# Patient Record
Sex: Female | Born: 1960 | Race: White | Hispanic: No | Marital: Single | Smoking: Former smoker
Health system: Southern US, Community
[De-identification: ages and names within clinical notes are randomized; demographics above are authoritative.]

## PROBLEM LIST (undated history)

## (undated) DIAGNOSIS — R51 Headache: Secondary | ICD-10-CM

## (undated) DIAGNOSIS — K449 Diaphragmatic hernia without obstruction or gangrene: Secondary | ICD-10-CM

## (undated) DIAGNOSIS — R519 Headache, unspecified: Secondary | ICD-10-CM

## (undated) DIAGNOSIS — F419 Anxiety disorder, unspecified: Secondary | ICD-10-CM

## (undated) DIAGNOSIS — G8929 Other chronic pain: Secondary | ICD-10-CM

## (undated) DIAGNOSIS — F4310 Post-traumatic stress disorder, unspecified: Secondary | ICD-10-CM

## (undated) DIAGNOSIS — R5382 Chronic fatigue, unspecified: Secondary | ICD-10-CM

## (undated) DIAGNOSIS — F4001 Agoraphobia with panic disorder: Secondary | ICD-10-CM

## (undated) DIAGNOSIS — M797 Fibromyalgia: Secondary | ICD-10-CM

## (undated) DIAGNOSIS — I890 Lymphedema, not elsewhere classified: Secondary | ICD-10-CM

## (undated) DIAGNOSIS — F319 Bipolar disorder, unspecified: Secondary | ICD-10-CM

## (undated) DIAGNOSIS — Z8659 Personal history of other mental and behavioral disorders: Secondary | ICD-10-CM

## (undated) DIAGNOSIS — M549 Dorsalgia, unspecified: Secondary | ICD-10-CM

## (undated) DIAGNOSIS — N3010 Interstitial cystitis (chronic) without hematuria: Secondary | ICD-10-CM

## (undated) DIAGNOSIS — M1990 Unspecified osteoarthritis, unspecified site: Secondary | ICD-10-CM

## (undated) HISTORY — PX: BARIATRIC SURGERY: SHX1103

## (undated) HISTORY — PX: APPENDECTOMY: SHX54

## (undated) HISTORY — PX: TONSILLECTOMY: SUR1361

## (undated) HISTORY — PX: CHOLECYSTECTOMY: SHX55

## (undated) HISTORY — PX: FOOT SURGERY: SHX648

## (undated) HISTORY — PX: ABDOMINAL HYSTERECTOMY: SHX81

## (undated) HISTORY — DX: Anxiety disorder, unspecified: F41.9

## (undated) HISTORY — PX: HEMORRHOID SURGERY: SHX153

## (undated) HISTORY — DX: Personal history of other mental and behavioral disorders: Z86.59

## (undated) HISTORY — PX: HERNIA REPAIR: SHX51

## (undated) HISTORY — PX: ESOPHAGUS SURGERY: SHX626

---

## 2006-01-25 ENCOUNTER — Emergency Department (HOSPITAL_COMMUNITY): Admission: EM | Admit: 2006-01-25 | Discharge: 2006-01-25 | Payer: Self-pay | Admitting: Emergency Medicine

## 2006-03-04 ENCOUNTER — Emergency Department (HOSPITAL_COMMUNITY): Admission: EM | Admit: 2006-03-04 | Discharge: 2006-03-05 | Payer: Self-pay | Admitting: Emergency Medicine

## 2006-03-31 ENCOUNTER — Emergency Department (HOSPITAL_COMMUNITY): Admission: EM | Admit: 2006-03-31 | Discharge: 2006-04-01 | Payer: Self-pay | Admitting: Emergency Medicine

## 2007-03-13 ENCOUNTER — Emergency Department (HOSPITAL_COMMUNITY): Admission: EM | Admit: 2007-03-13 | Discharge: 2007-03-13 | Payer: Self-pay | Admitting: Emergency Medicine

## 2007-06-14 ENCOUNTER — Emergency Department (HOSPITAL_COMMUNITY): Admission: EM | Admit: 2007-06-14 | Discharge: 2007-06-14 | Payer: Self-pay | Admitting: Emergency Medicine

## 2007-06-26 ENCOUNTER — Ambulatory Visit (HOSPITAL_COMMUNITY): Admission: RE | Admit: 2007-06-26 | Discharge: 2007-06-26 | Payer: Self-pay | Admitting: Orthopaedic Surgery

## 2007-07-20 ENCOUNTER — Emergency Department (HOSPITAL_COMMUNITY): Admission: EM | Admit: 2007-07-20 | Discharge: 2007-07-20 | Payer: Self-pay | Admitting: Emergency Medicine

## 2007-07-31 ENCOUNTER — Encounter (HOSPITAL_COMMUNITY): Admission: RE | Admit: 2007-07-31 | Discharge: 2007-08-30 | Payer: Self-pay | Admitting: Orthopaedic Surgery

## 2007-10-10 ENCOUNTER — Emergency Department (HOSPITAL_COMMUNITY): Admission: EM | Admit: 2007-10-10 | Discharge: 2007-10-10 | Payer: Self-pay | Admitting: Emergency Medicine

## 2007-11-29 ENCOUNTER — Ambulatory Visit: Payer: Self-pay | Admitting: Internal Medicine

## 2008-05-07 ENCOUNTER — Emergency Department (HOSPITAL_COMMUNITY): Admission: EM | Admit: 2008-05-07 | Discharge: 2008-05-07 | Payer: Self-pay | Admitting: Emergency Medicine

## 2008-08-27 ENCOUNTER — Ambulatory Visit: Payer: Self-pay | Admitting: Urology

## 2008-09-03 ENCOUNTER — Ambulatory Visit: Payer: Self-pay | Admitting: Urology

## 2008-09-28 ENCOUNTER — Emergency Department (HOSPITAL_COMMUNITY): Admission: EM | Admit: 2008-09-28 | Discharge: 2008-09-28 | Payer: Self-pay | Admitting: Emergency Medicine

## 2008-10-18 ENCOUNTER — Emergency Department (HOSPITAL_COMMUNITY): Admission: EM | Admit: 2008-10-18 | Discharge: 2008-10-18 | Payer: Self-pay | Admitting: Emergency Medicine

## 2008-12-02 ENCOUNTER — Emergency Department (HOSPITAL_COMMUNITY): Admission: EM | Admit: 2008-12-02 | Discharge: 2008-12-02 | Payer: Self-pay | Admitting: Emergency Medicine

## 2008-12-23 ENCOUNTER — Emergency Department: Payer: Self-pay | Admitting: Emergency Medicine

## 2009-01-22 ENCOUNTER — Ambulatory Visit: Payer: Self-pay | Admitting: Psychiatry

## 2009-01-22 ENCOUNTER — Other Ambulatory Visit: Payer: Self-pay

## 2009-01-22 ENCOUNTER — Other Ambulatory Visit: Payer: Self-pay | Admitting: Emergency Medicine

## 2009-01-22 ENCOUNTER — Inpatient Hospital Stay (HOSPITAL_COMMUNITY): Admission: RE | Admit: 2009-01-22 | Discharge: 2009-01-23 | Payer: Self-pay | Admitting: Psychiatry

## 2009-02-26 ENCOUNTER — Emergency Department: Payer: Self-pay | Admitting: Emergency Medicine

## 2009-06-07 ENCOUNTER — Emergency Department (HOSPITAL_COMMUNITY): Admission: EM | Admit: 2009-06-07 | Discharge: 2009-06-07 | Payer: Self-pay | Admitting: Emergency Medicine

## 2009-08-01 ENCOUNTER — Emergency Department (HOSPITAL_COMMUNITY): Admission: EM | Admit: 2009-08-01 | Discharge: 2009-08-01 | Payer: Self-pay | Admitting: Emergency Medicine

## 2009-12-09 ENCOUNTER — Emergency Department (HOSPITAL_COMMUNITY): Admission: EM | Admit: 2009-12-09 | Discharge: 2009-12-09 | Payer: Self-pay | Admitting: Emergency Medicine

## 2009-12-21 ENCOUNTER — Emergency Department: Payer: Self-pay | Admitting: Emergency Medicine

## 2009-12-25 ENCOUNTER — Ambulatory Visit: Payer: Self-pay | Admitting: Urology

## 2010-01-03 ENCOUNTER — Emergency Department: Payer: Self-pay | Admitting: Emergency Medicine

## 2010-01-16 ENCOUNTER — Emergency Department (HOSPITAL_COMMUNITY)
Admission: EM | Admit: 2010-01-16 | Discharge: 2010-01-17 | Payer: Self-pay | Source: Home / Self Care | Admitting: Emergency Medicine

## 2010-01-23 ENCOUNTER — Emergency Department (HOSPITAL_COMMUNITY)
Admission: EM | Admit: 2010-01-23 | Discharge: 2010-01-24 | Payer: Self-pay | Source: Home / Self Care | Admitting: Emergency Medicine

## 2010-01-24 ENCOUNTER — Emergency Department: Payer: Self-pay | Admitting: Emergency Medicine

## 2010-03-11 ENCOUNTER — Emergency Department (HOSPITAL_COMMUNITY)
Admission: EM | Admit: 2010-03-11 | Discharge: 2010-03-11 | Disposition: A | Discharge status: Home / Self Care | Payer: Medicare Other | Attending: Emergency Medicine | Admitting: Emergency Medicine

## 2010-03-11 ENCOUNTER — Ambulatory Visit: Payer: Self-pay | Admitting: Pain Medicine

## 2010-03-11 DIAGNOSIS — M549 Dorsalgia, unspecified: Secondary | ICD-10-CM | POA: Insufficient documentation

## 2010-03-11 DIAGNOSIS — IMO0001 Reserved for inherently not codable concepts without codable children: Secondary | ICD-10-CM | POA: Insufficient documentation

## 2010-03-16 ENCOUNTER — Emergency Department (HOSPITAL_COMMUNITY)
Admission: EM | Admit: 2010-03-16 | Discharge: 2010-03-17 | Disposition: A | Discharge status: Home / Self Care | Payer: Medicare Other | Attending: Emergency Medicine | Admitting: Emergency Medicine

## 2010-03-16 DIAGNOSIS — G43909 Migraine, unspecified, not intractable, without status migrainosus: Secondary | ICD-10-CM | POA: Insufficient documentation

## 2010-03-19 ENCOUNTER — Emergency Department (HOSPITAL_COMMUNITY)
Admission: EM | Admit: 2010-03-19 | Discharge: 2010-03-20 | Disposition: A | Discharge status: Home / Self Care | Payer: Medicare Other | Attending: Emergency Medicine | Admitting: Emergency Medicine

## 2010-03-19 ENCOUNTER — Emergency Department (HOSPITAL_COMMUNITY): Payer: Medicare Other

## 2010-03-19 DIAGNOSIS — R112 Nausea with vomiting, unspecified: Secondary | ICD-10-CM | POA: Insufficient documentation

## 2010-03-19 DIAGNOSIS — H538 Other visual disturbances: Secondary | ICD-10-CM | POA: Insufficient documentation

## 2010-03-19 DIAGNOSIS — R51 Headache: Secondary | ICD-10-CM | POA: Insufficient documentation

## 2010-04-06 LAB — URINALYSIS, ROUTINE W REFLEX MICROSCOPIC
Bilirubin Urine: NEGATIVE
Glucose, UA: NEGATIVE mg/dL
Hgb urine dipstick: NEGATIVE
Ketones, ur: NEGATIVE mg/dL
Leukocytes, UA: NEGATIVE
Nitrite: POSITIVE — AB
Protein, ur: NEGATIVE mg/dL
Specific Gravity, Urine: <1.005 — ABNORMAL LOW (ref 1.005–1.030)
Urobilinogen, UA: 0.2 mg/dL (ref 0.0–1.0)
pH: 6.5 (ref 5.0–8.0)

## 2010-04-06 LAB — COMPREHENSIVE METABOLIC PANEL
ALT: 118 U/L — ABNORMAL HIGH (ref 0–35)
Alkaline Phosphatase: 377 U/L — ABNORMAL HIGH (ref 39–117)
Calcium: 8.6 mg/dL (ref 8.4–10.5)
GFR calc Af Amer: >60 mL/min (ref >60)
Potassium: 3.8 mEq/L (ref 3.5–5.1)
Sodium: 138 mEq/L (ref 135–145)
Total Bilirubin: 0.6 mg/dL (ref 0.3–1.2)

## 2010-04-06 LAB — DIFFERENTIAL
Basophils Absolute: 0 K/uL (ref 0.0–0.1)
Basophils Relative: 0 % (ref 0–1)
Eosinophils Absolute: 0.1 10*3/uL (ref 0.0–0.7)
Eosinophils Relative: 1 % (ref 0–5)
Lymphocytes Relative: 29 % (ref 12–46)
Lymphs Abs: 2 10*3/uL (ref 0.7–4.0)
Monocytes Absolute: 0.4 10*3/uL (ref 0.1–1.0)
Monocytes Relative: 6 % (ref 3–12)
Neutro Abs: 4.3 10*3/uL (ref 1.7–7.7)
Neutrophils Relative %: 63 % (ref 43–77)

## 2010-04-06 LAB — PROTIME-INR
INR: 1.03 (ref 0.00–1.49)
Prothrombin Time: 13.7 seconds (ref 11.6–15.2)

## 2010-04-06 LAB — CBC
HCT: 35 % — ABNORMAL LOW (ref 36.0–46.0)
Hemoglobin: 11.9 g/dL — ABNORMAL LOW (ref 12.0–15.0)
MCH: 33.1 pg (ref 26.0–34.0)
MCHC: 34 g/dL (ref 30.0–36.0)
MCV: 97.5 fL (ref 78.0–100.0)
Platelets: 221 10*3/uL (ref 150–400)
RBC: 3.59 MIL/uL — ABNORMAL LOW (ref 3.87–5.11)
RDW: 12.5 % (ref 11.5–15.5)
WBC: 6.9 10*3/uL (ref 4.0–10.5)

## 2010-04-06 LAB — COMPREHENSIVE METABOLIC PANEL WITH GFR
AST: 389 U/L — ABNORMAL HIGH (ref 0–37)
Albumin: 3.6 g/dL (ref 3.5–5.2)
BUN: 5 mg/dL — ABNORMAL LOW (ref 6–23)
CO2: 28 meq/L (ref 19–32)
Chloride: 102 meq/L (ref 96–112)
Creatinine, Ser: 0.64 mg/dL (ref 0.4–1.2)
GFR calc non Af Amer: >60 mL/min (ref >60)
Glucose, Bld: 92 mg/dL (ref 70–99)
Total Protein: 5.8 g/dL — ABNORMAL LOW (ref 6.0–8.3)

## 2010-04-06 LAB — ETHANOL: Alcohol, Ethyl (B): <5 mg/dL (ref 0–10)

## 2010-04-06 LAB — LIPASE, BLOOD: Lipase: 194 U/L — ABNORMAL HIGH (ref 11–59)

## 2010-04-06 LAB — URINE MICROSCOPIC-ADD ON

## 2010-04-06 LAB — APTT: aPTT: 28 seconds (ref 24–37)

## 2010-04-07 LAB — URINALYSIS, ROUTINE W REFLEX MICROSCOPIC
Hgb urine dipstick: NEGATIVE
Ketones, ur: NEGATIVE mg/dL
Nitrite: NEGATIVE

## 2010-04-12 LAB — WET PREP, GENITAL
Clue Cells Wet Prep HPF POC: NONE SEEN
Yeast Wet Prep HPF POC: NONE SEEN

## 2010-04-12 LAB — URINALYSIS, ROUTINE W REFLEX MICROSCOPIC
Hgb urine dipstick: NEGATIVE
Nitrite: NEGATIVE
Specific Gravity, Urine: >1.03 — ABNORMAL HIGH (ref 1.005–1.030)
Urobilinogen, UA: 1 mg/dL (ref 0.0–1.0)
pH: 5.5 (ref 5.0–8.0)

## 2010-04-12 LAB — GC/CHLAMYDIA PROBE AMP, GENITAL
Chlamydia, DNA Probe: NEGATIVE
GC Probe Amp, Genital: NEGATIVE

## 2010-04-15 ENCOUNTER — Ambulatory Visit: Payer: Self-pay | Admitting: Gastroenterology

## 2010-04-27 LAB — BASIC METABOLIC PANEL
BUN: 7 mg/dL (ref 6–23)
CO2: 28 mEq/L (ref 19–32)
Glucose, Bld: 215 mg/dL — ABNORMAL HIGH (ref 70–99)
Potassium: 3 mEq/L — ABNORMAL LOW (ref 3.5–5.1)
Sodium: 138 mEq/L (ref 135–145)

## 2010-04-27 LAB — CBC
HCT: 40 % (ref 36.0–46.0)
Hemoglobin: 13.4 g/dL (ref 12.0–15.0)
MCHC: 33.5 g/dL (ref 30.0–36.0)
RDW: 13.1 % (ref 11.5–15.5)

## 2010-04-27 LAB — DIFFERENTIAL
Basophils Absolute: 0 10*3/uL (ref 0.0–0.1)
Basophils Relative: 1 % (ref 0–1)
Eosinophils Absolute: 0.4 10*3/uL (ref 0.0–0.7)
Eosinophils Relative: 5 % (ref 0–5)
Monocytes Absolute: 0.5 10*3/uL (ref 0.1–1.0)

## 2010-04-27 LAB — GLUCOSE, CAPILLARY
Glucose-Capillary: 126 mg/dL — ABNORMAL HIGH (ref 70–99)
Glucose-Capillary: 70 mg/dL (ref 70–99)

## 2010-05-01 LAB — CBC
Hemoglobin: 13.4 g/dL (ref 12.0–15.0)
RBC: 4 MIL/uL (ref 3.87–5.11)
RDW: 12.6 % (ref 11.5–15.5)
WBC: 4.7 10*3/uL (ref 4.0–10.5)

## 2010-05-01 LAB — COMPREHENSIVE METABOLIC PANEL
ALT: 15 U/L (ref 0–35)
Alkaline Phosphatase: 114 U/L (ref 39–117)
Chloride: 104 mEq/L (ref 96–112)
Glucose, Bld: 105 mg/dL — ABNORMAL HIGH (ref 70–99)
Potassium: 4 mEq/L (ref 3.5–5.1)
Sodium: 141 mEq/L (ref 135–145)
Total Protein: 6 g/dL (ref 6.0–8.3)

## 2010-05-01 LAB — DIFFERENTIAL
Basophils Relative: 1 % (ref 0–1)
Eosinophils Absolute: 0.2 10*3/uL (ref 0.0–0.7)
Monocytes Absolute: 0.3 10*3/uL (ref 0.1–1.0)
Monocytes Relative: 7 % (ref 3–12)
Neutrophils Relative %: 42 % — ABNORMAL LOW (ref 43–77)

## 2010-05-06 LAB — URINALYSIS, ROUTINE W REFLEX MICROSCOPIC
Bilirubin Urine: NEGATIVE
Hgb urine dipstick: NEGATIVE
Protein, ur: NEGATIVE mg/dL
Urobilinogen, UA: 0.2 mg/dL (ref 0.0–1.0)

## 2010-05-06 LAB — DIFFERENTIAL
Basophils Absolute: 0 10*3/uL (ref 0.0–0.1)
Lymphocytes Relative: 56 % — ABNORMAL HIGH (ref 12–46)
Monocytes Absolute: 0.3 10*3/uL (ref 0.1–1.0)
Monocytes Relative: 6 % (ref 3–12)
Neutro Abs: 1.5 10*3/uL — ABNORMAL LOW (ref 1.7–7.7)

## 2010-05-06 LAB — BASIC METABOLIC PANEL
Calcium: 9.2 mg/dL (ref 8.4–10.5)
GFR calc Af Amer: >60 mL/min (ref >60)
GFR calc non Af Amer: >60 mL/min (ref >60)
Sodium: 141 mEq/L (ref 135–145)

## 2010-05-06 LAB — ETHANOL: Alcohol, Ethyl (B): <5 mg/dL (ref 0–10)

## 2010-05-06 LAB — RAPID URINE DRUG SCREEN, HOSP PERFORMED
Amphetamines: NOT DETECTED
Tetrahydrocannabinol: NOT DETECTED

## 2010-05-06 LAB — CBC
Hemoglobin: 12.9 g/dL (ref 12.0–15.0)
RBC: 3.88 MIL/uL (ref 3.87–5.11)

## 2010-10-16 LAB — DIFFERENTIAL
Basophils Absolute: 0
Basophils Relative: 0
Eosinophils Absolute: 0.5
Eosinophils Relative: 6 — ABNORMAL HIGH
Neutrophils Relative %: 40 — ABNORMAL LOW

## 2010-10-16 LAB — COMPREHENSIVE METABOLIC PANEL
ALT: 37 — ABNORMAL HIGH
AST: 38 — ABNORMAL HIGH
Alkaline Phosphatase: 134 — ABNORMAL HIGH
CO2: 28
Calcium: 9.1
Chloride: 105
GFR calc non Af Amer: >60
Glucose, Bld: 100 — ABNORMAL HIGH
Potassium: 3.6
Sodium: 140
Total Bilirubin: 0.6

## 2010-10-16 LAB — URINALYSIS, ROUTINE W REFLEX MICROSCOPIC
Bilirubin Urine: NEGATIVE
Nitrite: NEGATIVE
Specific Gravity, Urine: <1.005 — ABNORMAL LOW
pH: 7

## 2010-10-16 LAB — CBC
Hemoglobin: 14.6
MCHC: 34.5
RBC: 4.25
WBC: 8.3

## 2010-10-26 LAB — URINALYSIS, ROUTINE W REFLEX MICROSCOPIC
Ketones, ur: NEGATIVE
Protein, ur: NEGATIVE
Urobilinogen, UA: 0.2

## 2010-10-26 LAB — RAPID URINE DRUG SCREEN, HOSP PERFORMED
Barbiturates: NOT DETECTED
Benzodiazepines: POSITIVE — AB

## 2011-02-15 ENCOUNTER — Encounter (HOSPITAL_COMMUNITY): Payer: Self-pay

## 2011-02-15 ENCOUNTER — Emergency Department (HOSPITAL_COMMUNITY): Payer: Medicare Other

## 2011-02-15 ENCOUNTER — Emergency Department (HOSPITAL_COMMUNITY)
Admission: EM | Admit: 2011-02-15 | Discharge: 2011-02-15 | Disposition: A | Discharge status: Home / Self Care | Payer: Medicare Other | Attending: Emergency Medicine | Admitting: Emergency Medicine

## 2011-02-15 DIAGNOSIS — F4001 Agoraphobia with panic disorder: Secondary | ICD-10-CM | POA: Insufficient documentation

## 2011-02-15 DIAGNOSIS — Z9079 Acquired absence of other genital organ(s): Secondary | ICD-10-CM | POA: Insufficient documentation

## 2011-02-15 DIAGNOSIS — R7989 Other specified abnormal findings of blood chemistry: Secondary | ICD-10-CM | POA: Insufficient documentation

## 2011-02-15 DIAGNOSIS — R109 Unspecified abdominal pain: Secondary | ICD-10-CM | POA: Insufficient documentation

## 2011-02-15 DIAGNOSIS — F319 Bipolar disorder, unspecified: Secondary | ICD-10-CM | POA: Insufficient documentation

## 2011-02-15 DIAGNOSIS — M549 Dorsalgia, unspecified: Secondary | ICD-10-CM | POA: Insufficient documentation

## 2011-02-15 DIAGNOSIS — F4310 Post-traumatic stress disorder, unspecified: Secondary | ICD-10-CM | POA: Insufficient documentation

## 2011-02-15 DIAGNOSIS — K449 Diaphragmatic hernia without obstruction or gangrene: Secondary | ICD-10-CM | POA: Insufficient documentation

## 2011-02-15 DIAGNOSIS — F17200 Nicotine dependence, unspecified, uncomplicated: Secondary | ICD-10-CM | POA: Insufficient documentation

## 2011-02-15 DIAGNOSIS — G8929 Other chronic pain: Secondary | ICD-10-CM | POA: Insufficient documentation

## 2011-02-15 DIAGNOSIS — IMO0001 Reserved for inherently not codable concepts without codable children: Secondary | ICD-10-CM | POA: Insufficient documentation

## 2011-02-15 DIAGNOSIS — N3010 Interstitial cystitis (chronic) without hematuria: Secondary | ICD-10-CM | POA: Insufficient documentation

## 2011-02-15 DIAGNOSIS — Z9889 Other specified postprocedural states: Secondary | ICD-10-CM | POA: Insufficient documentation

## 2011-02-15 DIAGNOSIS — R4182 Altered mental status, unspecified: Secondary | ICD-10-CM | POA: Insufficient documentation

## 2011-02-15 DIAGNOSIS — R51 Headache: Secondary | ICD-10-CM | POA: Insufficient documentation

## 2011-02-15 HISTORY — DX: Lymphedema, not elsewhere classified: I89.0

## 2011-02-15 HISTORY — DX: Post-traumatic stress disorder, unspecified: F43.10

## 2011-02-15 HISTORY — DX: Diaphragmatic hernia without obstruction or gangrene: K44.9

## 2011-02-15 HISTORY — DX: Headache, unspecified: R51.9

## 2011-02-15 HISTORY — DX: Dorsalgia, unspecified: M54.9

## 2011-02-15 HISTORY — DX: Agoraphobia with panic disorder: F40.01

## 2011-02-15 HISTORY — DX: Other chronic pain: G89.29

## 2011-02-15 HISTORY — DX: Bipolar disorder, unspecified: F31.9

## 2011-02-15 HISTORY — DX: Fibromyalgia: M79.7

## 2011-02-15 HISTORY — DX: Chronic fatigue, unspecified: R53.82

## 2011-02-15 HISTORY — DX: Headache: R51

## 2011-02-15 HISTORY — DX: Unspecified osteoarthritis, unspecified site: M19.90

## 2011-02-15 HISTORY — DX: Interstitial cystitis (chronic) without hematuria: N30.10

## 2011-02-15 LAB — URINALYSIS, ROUTINE W REFLEX MICROSCOPIC
Bilirubin Urine: NEGATIVE
Glucose, UA: NEGATIVE mg/dL
Ketones, ur: NEGATIVE mg/dL
Leukocytes, UA: NEGATIVE
Protein, ur: NEGATIVE mg/dL

## 2011-02-15 LAB — COMPREHENSIVE METABOLIC PANEL
ALT: 88 U/L — ABNORMAL HIGH (ref 0–35)
AST: 50 U/L — ABNORMAL HIGH (ref 0–37)
Alkaline Phosphatase: 305 U/L — ABNORMAL HIGH (ref 39–117)
CO2: 35 mEq/L — ABNORMAL HIGH (ref 19–32)
GFR calc Af Amer: >90 mL/min (ref >90)
GFR calc non Af Amer: >90 mL/min (ref >90)
Glucose, Bld: 105 mg/dL — ABNORMAL HIGH (ref 70–99)
Potassium: 4.1 mEq/L (ref 3.5–5.1)
Sodium: 139 mEq/L (ref 135–145)

## 2011-02-15 LAB — URINE CULTURE
Colony Count: NO GROWTH
Culture: NO GROWTH

## 2011-02-15 LAB — DIFFERENTIAL
Lymphocytes Relative: 33 % (ref 12–46)
Lymphs Abs: 2.2 10*3/uL (ref 0.7–4.0)
Neutro Abs: 3.9 10*3/uL (ref 1.7–7.7)
Neutrophils Relative %: 58 % (ref 43–77)

## 2011-02-15 LAB — CBC
Platelets: 242 10*3/uL (ref 150–400)
RBC: 3.79 MIL/uL — ABNORMAL LOW (ref 3.87–5.11)
WBC: 6.7 10*3/uL (ref 4.0–10.5)

## 2011-02-15 LAB — GLUCOSE, CAPILLARY: Glucose-Capillary: 92 mg/dL (ref 70–99)

## 2011-02-15 MED ORDER — METHOCARBAMOL 500 MG PO TABS: 1000.0000 mg | ORAL_TABLET | Freq: Four times a day (QID) | ORAL | Status: AC | PRN

## 2011-02-15 NOTE — ED Notes (Signed)
C/o right flank pain (cramping, pressure) x 2 weeks that radiates to right abdomen; c/o nausea; c/o headache.  A&ox4; answers questions appropriately.

## 2011-02-15 NOTE — ED Notes (Signed)
Pt reports r lower back pain x 2 weeks.  Denies injury.  Reports has interstitial cystitis and c/o burning with urination.  Pt says her cbg was 38 this morning but came up after eating some candy.  C/O nausea.

## 2011-02-15 NOTE — ED Provider Notes (Signed)
History     CSN: [id number]  Arrival date & time 02/15/11  1608   Chief Complaint  Patient presents with  . Back Pain     HPI Pt was seen at 1735.  Per pt, c/o gradual onset and persistence of constant acute flair of his chronic low back "pain" for the past 2 weeks.  Denies any change in her usual chronic pain pattern.  Denies incont/retention of bowel or bladder, no saddle anesthesia, no focal motor weakness, no tingling/numbness in extremities, no injury.   Pt also c/o gradual onset and persistence of several intermittent episodes of feeling "weak," "shakey" and "sweaty" for the past several months.  Pt states she "eats some candy" and "feels better."  States her family member took her CBG during one of these episodes and it "was 16."  States she was eval by her PMD for same "but they didn't do anything."  States she is "here today for some blood tests."  Denies fevers, no SOB/CP, no cough, no abd pain, no N/V/D, no fevers, no rash.    Pain Management: Dr. Rochele Raring Past Medical History  Diagnosis Date  . Interstitial cystitis   . Fibromyalgia   . Chronic fatigue   . Asthma   . Arthritis   . Bipolar 1 disorder   . PTSD (post-traumatic stress disorder)   . Lymphedema   . Panic disorder with agoraphobia   . Chronic pain   . Hiatal hernia   . Chronic headaches   . Chronic back pain     Past Surgical History  Procedure Date  . Cholecystectomy   . Abdominal hysterectomy   . Tonsillectomy   . Foot surgery   . Bariatric surgery   . Hemorrhoid surgery   . Hernia repair   . Esophagus surgery   . Appendectomy     History  Substance Use Topics  . Smoking status: Current Everyday Smoker  . Smokeless tobacco: Not on file  . Alcohol Use: Yes    Review of Systems ROS: Statement: All systems negative except as marked or noted in the HPI; Constitutional: Negative for fever and chills. ; ; Eyes: Negative for eye pain, redness and discharge. ; ; ENMT: Negative for ear pain,  hoarseness, nasal congestion, sinus pressure and sore throat. ; ; Cardiovascular: Negative for chest pain, palpitations, diaphoresis, dyspnea and peripheral edema. ; ; Respiratory: Negative for cough, wheezing and stridor. ; ; Gastrointestinal: Negative for nausea, vomiting, diarrhea, abdominal pain, blood in stool, hematemesis, jaundice and rectal bleeding. . ; ; Genitourinary: Negative for dysuria, flank pain and hematuria. ; ; Musculoskeletal: +right sided low back pain.  Negative for neck pain. Negative for swelling and trauma.; ; Skin: Negative for pruritus, rash, abrasions, blisters, bruising and skin lesion.; ; Neuro: Negative for headache, lightheadedness and neck stiffness. Negative for altered level of consciousness , altered mental status, extremity weakness, paresthesias, involuntary movement, seizure and syncope.     Allergies  Bee venom; Erythromycin; Haldol; Penicillins; Abilify; Allegra; Ciprocin-fluocin-procin; Iodine; Lithium; Lyrica; Paxil; Prozac; Remeron; Tape; Zofran; and Zonisamide  Home Medications   Current Outpatient Rx  Name Route Sig Dispense Refill  . ACETAMINOPHEN 500 MG PO TABS Oral Take 500 mg by mouth as needed. For headache    . ALBUTEROL SULFATE HFA 108 (90 BASE) MCG/ACT IN AERS Inhalation Inhale 1 puff into the lungs every 6 (six) hours as needed. For shortness of breath    . DOXEPIN HCL 75 MG PO CAPS Oral Take 150 mg by  mouth at bedtime.    . ESTROGENS CONJUGATED 0.625 MG PO TABS Oral Take 0.625 mg by mouth daily.    . OMEGA-3 FATTY ACIDS 1000 MG PO CAPS Oral Take 1 g by mouth daily.    . FUROSEMIDE 40 MG PO TABS Oral Take 40 mg by mouth daily.    . LUBIPROSTONE 24 MCG PO CAPS Oral Take 24 mcg by mouth 2 (two) times daily.    Marland Kitchen MONTELUKAST SODIUM 10 MG PO TABS Oral Take 10 mg by mouth at bedtime.    . OXYCODONE HCL ER 10 MG PO TB12 Oral Take 10 mg by mouth 3 (three) times daily as needed. For pain    . OXYMORPHONE HCL ER 10 MG PO TB12 Oral Take 20 mg by mouth  every 12 (twelve) hours.    Marland Kitchen PROBIOTIC FORMULA PO Oral Take 1 tablet by mouth 2 (two) times daily.    Marland Kitchen TAMSULOSIN HCL 0.4 MG PO CAPS Oral Take 0.4 mg by mouth daily.      BP 111/72  Pulse 62  Temp(Src) 98 F (36.7 C) (Oral)  Resp 20  Ht 5\' 8"  (1.727 m)  Wt 160 lb (72.576 kg)  BMI 24.33 kg/m2  SpO2 97%  Physical Exam 1740: Physical examination:  Nursing notes reviewed; Vital signs and O2 SAT reviewed;  Constitutional: Well developed, Well nourished, Well hydrated, In no acute distress; Head:  Normocephalic, atraumatic; Eyes: EOMI, PERRL, No scleral icterus; ENMT: Mouth and pharynx normal, Mucous membranes moist; Neck: Supple, Full range of motion, No lymphadenopathy; Cardiovascular: Regular rate and rhythm, No murmur, rub, or gallop; Respiratory: Breath sounds clear & equal bilaterally, No rales, rhonchi, wheezes, or rub, Normal respiratory effort/excursion; Chest: Nontender, Movement normal; Abdomen: Soft, Nontender, Nondistended, Normal bowel sounds; Genitourinary: No CVA tenderness; Spine:  No midline CS, TS, LS tenderness. +TTP right lumbar paraspinal muscles and SI joint areas; Extremities: Pulses normal, No tenderness, 1+ pedal edema, No calf asymmetry.; Neuro: AA&Ox3, Major CN grossly intact. Speech clear, no facial droop.  Strength 5/5 equal bilat UE's and LE's, including great toe dorsiflexion.  DTR 2/4 equal bilat UE's and LE's.  No gross sensory deficits.  Neg straight leg raises bilat.; Skin: Color normal, Warm, Dry, no rash, no petechiae.   ED Course  Procedures   MDM  MDM Reviewed: previous chart, nursing note and vitals Interpretation: labs and CT scan     Results for orders placed during the hospital encounter of 02/15/11  GLUCOSE, CAPILLARY      Component Value Range   Glucose-Capillary 92  70 - 99 (mg/dL)  URINALYSIS, ROUTINE W REFLEX MICROSCOPIC      Component Value Range   Color, Urine YELLOW  YELLOW    APPearance CLEAR  CLEAR    Specific Gravity, Urine  1.025  1.005 - 1.030    pH 5.5  5.0 - 8.0    Glucose, UA NEGATIVE  NEGATIVE (mg/dL)   Hgb urine dipstick NEGATIVE  NEGATIVE    Bilirubin Urine NEGATIVE  NEGATIVE    Ketones, ur NEGATIVE  NEGATIVE (mg/dL)   Protein, ur NEGATIVE  NEGATIVE (mg/dL)   Urobilinogen, UA 0.2  0.0 - 1.0 (mg/dL)   Nitrite NEGATIVE  NEGATIVE    Leukocytes, UA NEGATIVE  NEGATIVE   CBC      Component Value Range   WBC 6.7  4.0 - 10.5 (K/uL)   RBC 3.79 (*) 3.87 - 5.11 (MIL/uL)   Hemoglobin 12.9  12.0 - 15.0 (g/dL)   HCT 38.8  36.0 - 46.0 (%)   MCV 102.4 (*) 78.0 - 100.0 (fL)   MCH 34.0  26.0 - 34.0 (pg)   MCHC 33.2  30.0 - 36.0 (g/dL)   RDW 19.1  47.8 - 29.5 (%)   Platelets 242  150 - 400 (K/uL)  DIFFERENTIAL      Component Value Range   Neutrophils Relative 58  43 - 77 (%)   Neutro Abs 3.9  1.7 - 7.7 (K/uL)   Lymphocytes Relative 33  12 - 46 (%)   Lymphs Abs 2.2  0.7 - 4.0 (K/uL)   Monocytes Relative 6  3 - 12 (%)   Monocytes Absolute 0.4  0.1 - 1.0 (K/uL)   Eosinophils Relative 3  0 - 5 (%)   Eosinophils Absolute 0.2  0.0 - 0.7 (K/uL)   Basophils Relative 1  0 - 1 (%)   Basophils Absolute 0.0  0.0 - 0.1 (K/uL)  COMPREHENSIVE METABOLIC PANEL      Component Value Range   Sodium 139  135 - 145 (mEq/L)   Potassium 4.1  3.5 - 5.1 (mEq/L)   Chloride 100  96 - 112 (mEq/L)   CO2 35 (*) 19 - 32 (mEq/L)   Glucose, Bld 105 (*) 70 - 99 (mg/dL)   BUN 15  6 - 23 (mg/dL)   Creatinine, Ser 6.21  0.50 - 1.10 (mg/dL)   Calcium 9.4  8.4 - 30.8 (mg/dL)   Total Protein 6.5  6.0 - 8.3 (g/dL)   Albumin 3.7  3.5 - 5.2 (g/dL)   AST 50 (*) 0 - 37 (U/L)   ALT 88 (*) 0 - 35 (U/L)   Alkaline Phosphatase 305 (*) 39 - 117 (U/L)   Total Bilirubin 0.2 (*) 0.3 - 1.2 (mg/dL)   GFR calc non Af Amer >90  >90 (mL/min)   GFR calc Af Amer >90  >90 (mL/min)  LIPASE, BLOOD      Component Value Range   Lipase 17  11 - 59 (U/L)    Ct Abdomen Pelvis Wo Contrast 02/15/2011  *RADIOLOGY REPORT*  Clinical Data: Abdominal pain and  bloating for 2 months.  Low back pain and right-sided flank pain for 2 weeks.  CT ABDOMEN AND PELVIS WITHOUT CONTRAST  Technique:  Multidetector CT imaging of the abdomen and pelvis was performed following the standard protocol without intravenous contrast.  Comparison: CT scan dated 01/24/2010  Findings: Lung bases are clear.  Previous cholecystectomy.  Chronic dilatation of the common bile duct to a diameter of approximately 16 mm.  Spleen, pancreas, adrenal glands, and kidneys are normal. Evidence of previous gastric bypass surgery.  Extensive stool throughout the non distended colon.  No fecal impaction.  Uterus has been removed.  No free air or free fluid in the abdomen.  No acute osseous abnormality.  Minimal bulges of the L4-5 and L5-S1 discs with mild facet arthritis at L4-5 bilaterally.  IMPRESSION: No acute abnormalities of the abdomen or pelvis.  Original Report Authenticated By: Gwynn Burly, M.D.    Results for BRIEA, MCENERY (MRN [id number]) as of 02/15/2011 19:37  Ref. Range 01/23/2010 20:52 02/15/2011 18:26  AST Latest Range: 0-37 U/L 389 (H) 50 (H)  ALT Latest Range: 0-35 U/L 118 (H) 88 (H)     7:37 PM:  LFT's elevated but improved from previous.  [place] Controlled Substance Data Base accessed:  Pt filled oxycodone 10mg  tabs #90 on 01/30/11, and opana ER 10mg  tabs #120 on 01/28/11, both rx by Dr. Rochele Raring.  Pt has chronic pain med (opana, oxycontin), will not rx any further narcs today.  Will add muscle relaxer to her pain regimen.  Pt encouraged to f/u with her PMD and pain management specialist for her chronic pain needs.  Dx testing d/w pt.  Questions answered.  Verb understanding, agreeable to d/c home with outpt f/u.     Delisia Mcquiston Allison Quarry, DO 02/17/11 1531

## 2011-04-15 ENCOUNTER — Inpatient Hospital Stay: Payer: Self-pay | Admitting: Psychiatry

## 2011-04-16 LAB — COMPREHENSIVE METABOLIC PANEL
Albumin: 3.6 g/dL (ref 3.4–5.0)
Alkaline Phosphatase: 262 U/L — ABNORMAL HIGH (ref 50–136)
BUN: 14 mg/dL (ref 7–18)
Calcium, Total: 8.6 mg/dL (ref 8.5–10.1)
Creatinine: 0.63 mg/dL (ref 0.60–1.30)
EGFR (African American): >60
EGFR (Non-African Amer.): >60
Glucose: 129 mg/dL — ABNORMAL HIGH (ref 65–99)
Potassium: 3.7 mmol/L (ref 3.5–5.1)
SGPT (ALT): 841 U/L — ABNORMAL HIGH
Sodium: 142 mmol/L (ref 136–145)
Total Protein: 6.6 g/dL (ref 6.4–8.2)

## 2011-04-16 LAB — CBC WITH DIFFERENTIAL/PLATELET
Basophil #: 0 10*3/uL (ref 0.0–0.1)
Basophil %: 0.3 %
Eosinophil #: 0 10*3/uL (ref 0.0–0.7)
Eosinophil %: 1 %
HGB: 12.5 g/dL (ref 12.0–16.0)
Lymphocyte #: 0.7 10*3/uL — ABNORMAL LOW (ref 1.0–3.6)
Lymphocyte %: 16.6 %
MCH: 32.4 pg (ref 26.0–34.0)
MCV: 100 fL (ref 80–100)
Monocyte #: 0.4 10*3/uL (ref 0.0–0.7)
Monocyte %: 8.7 %
Platelet: 177 10*3/uL (ref 150–440)
RBC: 3.85 10*6/uL (ref 3.80–5.20)
WBC: 4.4 10*3/uL (ref 3.6–11.0)

## 2011-04-16 LAB — URINALYSIS, COMPLETE
Glucose,UR: NEGATIVE mg/dL (ref 0–75)
Ketone: NEGATIVE
Leukocyte Esterase: NEGATIVE
Nitrite: POSITIVE
Ph: 7 (ref 4.5–8.0)
RBC,UR: 2 /HPF (ref 0–5)
Squamous Epithelial: 5
WBC UR: 2 /HPF (ref 0–5)

## 2011-04-16 LAB — TSH: Thyroid Stimulating Horm: 5.95 u[IU]/mL — ABNORMAL HIGH

## 2011-04-17 LAB — CARBAMAZEPINE LEVEL, TOTAL: Carbamazepine: <0.5 ug/mL — ABNORMAL LOW (ref 4.0–12.0)

## 2011-04-17 LAB — HEPATIC FUNCTION PANEL A (ARMC)
Albumin: 3.4 g/dL (ref 3.4–5.0)
Bilirubin,Total: 0.7 mg/dL (ref 0.2–1.0)
SGPT (ALT): 526 U/L — ABNORMAL HIGH
Total Protein: 6.2 g/dL — ABNORMAL LOW (ref 6.4–8.2)

## 2011-04-17 LAB — DRUG SCREEN, URINE
Amphetamines, Ur Screen: NEGATIVE (ref ?–1000)
Opiate, Ur Screen: POSITIVE (ref ?–300)
Tricyclic, Ur Screen: NEGATIVE (ref ?–1000)

## 2011-04-17 LAB — BASIC METABOLIC PANEL
BUN: 12 mg/dL (ref 7–18)
Calcium, Total: 8.6 mg/dL (ref 8.5–10.1)
Creatinine: 0.68 mg/dL (ref 0.60–1.30)
EGFR (African American): >60
EGFR (Non-African Amer.): >60
Glucose: 89 mg/dL (ref 65–99)
Sodium: 141 mmol/L (ref 136–145)

## 2011-04-17 LAB — ACETAMINOPHEN LEVEL: Acetaminophen: 2 ug/mL — ABNORMAL LOW

## 2011-04-17 LAB — LITHIUM LEVEL: Lithium: <0.2 mmol/L — ABNORMAL LOW

## 2011-04-18 LAB — COMPREHENSIVE METABOLIC PANEL
Albumin: 3.4 g/dL (ref 3.4–5.0)
Alkaline Phosphatase: 279 U/L — ABNORMAL HIGH (ref 50–136)
BUN: 13 mg/dL (ref 7–18)
Calcium, Total: 8.7 mg/dL (ref 8.5–10.1)
Creatinine: 0.62 mg/dL (ref 0.60–1.30)
EGFR (Non-African Amer.): >60
Glucose: 92 mg/dL (ref 65–99)
Osmolality: 277 (ref 275–301)
Potassium: 3.7 mmol/L (ref 3.5–5.1)
SGOT(AST): 215 U/L — ABNORMAL HIGH (ref 15–37)
Sodium: 139 mmol/L (ref 136–145)

## 2011-04-19 LAB — T4, FREE: Free Thyroxine: 1.04 ng/dL (ref 0.76–1.46)

## 2011-04-19 LAB — HEPATIC FUNCTION PANEL A (ARMC)
Alkaline Phosphatase: 257 U/L — ABNORMAL HIGH (ref 50–136)
Bilirubin,Total: 0.5 mg/dL (ref 0.2–1.0)
SGOT(AST): 120 U/L — ABNORMAL HIGH (ref 15–37)
Total Protein: 6.4 g/dL (ref 6.4–8.2)

## 2011-04-19 LAB — PROTIME-INR
INR: 0.9
Prothrombin Time: 12.8 secs (ref 11.5–14.7)

## 2011-04-20 LAB — URINALYSIS, COMPLETE
Bilirubin,UR: NEGATIVE
Leukocyte Esterase: NEGATIVE
Nitrite: POSITIVE
Ph: 7 (ref 4.5–8.0)
Protein: NEGATIVE
RBC,UR: 1 /HPF (ref 0–5)
Specific Gravity: 1.011 (ref 1.003–1.030)
Squamous Epithelial: 4
WBC UR: 2 /HPF (ref 0–5)

## 2011-04-20 LAB — HEPATIC FUNCTION PANEL A (ARMC)
Albumin: 3.5 g/dL (ref 3.4–5.0)
Alkaline Phosphatase: 236 U/L — ABNORMAL HIGH (ref 50–136)
SGOT(AST): 68 U/L — ABNORMAL HIGH (ref 15–37)
SGPT (ALT): 190 U/L — ABNORMAL HIGH
Total Protein: 6.7 g/dL (ref 6.4–8.2)

## 2011-04-21 LAB — HEPATIC FUNCTION PANEL A (ARMC)
Albumin: 3.4 g/dL (ref 3.4–5.0)
Bilirubin,Total: 0.5 mg/dL (ref 0.2–1.0)

## 2011-04-22 LAB — HEPATIC FUNCTION PANEL A (ARMC)
Alkaline Phosphatase: 164 U/L — ABNORMAL HIGH (ref 50–136)
Bilirubin,Total: 0.4 mg/dL (ref 0.2–1.0)
SGOT(AST): 28 U/L (ref 15–37)
SGPT (ALT): 85 U/L — ABNORMAL HIGH

## 2011-04-23 LAB — HEPATIC FUNCTION PANEL A (ARMC)
Albumin: 3.4 g/dL (ref 3.4–5.0)
Alkaline Phosphatase: 187 U/L — ABNORMAL HIGH (ref 50–136)
Bilirubin,Total: 0.4 mg/dL (ref 0.2–1.0)
SGOT(AST): 56 U/L — ABNORMAL HIGH (ref 15–37)

## 2011-04-24 LAB — HEPATIC FUNCTION PANEL A (ARMC)
Alkaline Phosphatase: 162 U/L — ABNORMAL HIGH (ref 50–136)
Bilirubin,Total: 0.4 mg/dL (ref 0.2–1.0)
SGOT(AST): 34 U/L (ref 15–37)
SGPT (ALT): 64 U/L

## 2011-04-29 ENCOUNTER — Ambulatory Visit: Payer: Self-pay | Admitting: Psychiatry

## 2011-05-26 ENCOUNTER — Ambulatory Visit: Payer: Self-pay | Admitting: Psychiatry

## 2011-06-07 ENCOUNTER — Emergency Department: Payer: Self-pay | Admitting: Emergency Medicine

## 2011-06-08 LAB — URINALYSIS, COMPLETE
Bilirubin,UR: NEGATIVE
Blood: NEGATIVE
Glucose,UR: NEGATIVE mg/dL (ref 0–75)
Ketone: NEGATIVE
Nitrite: NEGATIVE
Protein: NEGATIVE
RBC,UR: 1 /HPF (ref 0–5)

## 2011-06-08 LAB — CBC
HGB: 13.5 g/dL (ref 12.0–16.0)
MCH: 33.7 pg (ref 26.0–34.0)
MCHC: 33.2 g/dL (ref 32.0–36.0)
Platelet: 248 10*3/uL (ref 150–440)
RBC: 4 10*6/uL (ref 3.80–5.20)
RDW: 14.2 % (ref 11.5–14.5)
WBC: 7.8 10*3/uL (ref 3.6–11.0)

## 2011-06-08 LAB — COMPREHENSIVE METABOLIC PANEL
Albumin: 3.6 g/dL (ref 3.4–5.0)
BUN: 12 mg/dL (ref 7–18)
Bilirubin,Total: 0.2 mg/dL (ref 0.2–1.0)
Calcium, Total: 8.8 mg/dL (ref 8.5–10.1)
Chloride: 109 mmol/L — ABNORMAL HIGH (ref 98–107)
Co2: 27 mmol/L (ref 21–32)
EGFR (African American): >60
EGFR (Non-African Amer.): >60
SGPT (ALT): 19 U/L
Sodium: 144 mmol/L (ref 136–145)

## 2011-07-03 LAB — COMPREHENSIVE METABOLIC PANEL
Albumin: 3.3 g/dL — ABNORMAL LOW (ref 3.4–5.0)
Alkaline Phosphatase: 184 U/L — ABNORMAL HIGH (ref 50–136)
Anion Gap: 6 — ABNORMAL LOW (ref 7–16)
BUN: 9 mg/dL (ref 7–18)
Bilirubin,Total: 0.2 mg/dL (ref 0.2–1.0)
Calcium, Total: 8 mg/dL — ABNORMAL LOW (ref 8.5–10.1)
Chloride: 110 mmol/L — ABNORMAL HIGH (ref 98–107)
Osmolality: 279 (ref 275–301)
Sodium: 141 mmol/L (ref 136–145)

## 2011-07-03 LAB — URINALYSIS, COMPLETE
Bilirubin,UR: NEGATIVE
Blood: NEGATIVE
Glucose,UR: 50 mg/dL (ref 0–75)
Ketone: NEGATIVE
Leukocyte Esterase: NEGATIVE
Nitrite: NEGATIVE
Ph: 5 (ref 4.5–8.0)
Protein: NEGATIVE
RBC,UR: <1 /HPF (ref 0–5)
Specific Gravity: 1.012 (ref 1.003–1.030)
Squamous Epithelial: 2

## 2011-07-03 LAB — CBC
MCH: 32.2 pg (ref 26.0–34.0)
MCV: 100 fL (ref 80–100)
Platelet: 189 10*3/uL (ref 150–440)
RBC: 3.98 10*6/uL (ref 3.80–5.20)
RDW: 14.3 % (ref 11.5–14.5)
WBC: 4.5 10*3/uL (ref 3.6–11.0)

## 2011-07-03 LAB — DRUG SCREEN, URINE
Barbiturates, Ur Screen: NEGATIVE (ref ?–200)
MDMA (Ecstasy)Ur Screen: NEGATIVE (ref ?–500)
Opiate, Ur Screen: NEGATIVE (ref ?–300)
Phencyclidine (PCP) Ur S: NEGATIVE (ref ?–25)
Tricyclic, Ur Screen: NEGATIVE (ref ?–1000)

## 2011-07-03 LAB — SALICYLATE LEVEL: Salicylates, Serum: 3.1 mg/dL — ABNORMAL HIGH

## 2011-07-03 LAB — ETHANOL: Ethanol: <3 mg/dL

## 2011-07-04 ENCOUNTER — Inpatient Hospital Stay: Payer: Self-pay | Admitting: Psychiatry

## 2011-12-17 ENCOUNTER — Emergency Department (HOSPITAL_COMMUNITY): Payer: Medicare Other

## 2011-12-17 ENCOUNTER — Emergency Department (HOSPITAL_COMMUNITY)
Admission: EM | Admit: 2011-12-17 | Discharge: 2011-12-17 | Disposition: A | Discharge status: Home / Self Care | Payer: Medicare Other | Attending: Emergency Medicine | Admitting: Emergency Medicine

## 2011-12-17 ENCOUNTER — Encounter (HOSPITAL_COMMUNITY): Payer: Self-pay | Admitting: *Deleted

## 2011-12-17 DIAGNOSIS — Z87448 Personal history of other diseases of urinary system: Secondary | ICD-10-CM | POA: Insufficient documentation

## 2011-12-17 DIAGNOSIS — R5381 Other malaise: Secondary | ICD-10-CM | POA: Insufficient documentation

## 2011-12-17 DIAGNOSIS — M797 Fibromyalgia: Secondary | ICD-10-CM

## 2011-12-17 DIAGNOSIS — J3489 Other specified disorders of nose and nasal sinuses: Secondary | ICD-10-CM | POA: Insufficient documentation

## 2011-12-17 DIAGNOSIS — R112 Nausea with vomiting, unspecified: Secondary | ICD-10-CM

## 2011-12-17 DIAGNOSIS — R319 Hematuria, unspecified: Secondary | ICD-10-CM | POA: Insufficient documentation

## 2011-12-17 DIAGNOSIS — J45909 Unspecified asthma, uncomplicated: Secondary | ICD-10-CM | POA: Insufficient documentation

## 2011-12-17 DIAGNOSIS — M545 Low back pain, unspecified: Secondary | ICD-10-CM | POA: Insufficient documentation

## 2011-12-17 DIAGNOSIS — R51 Headache: Secondary | ICD-10-CM | POA: Insufficient documentation

## 2011-12-17 DIAGNOSIS — Z8739 Personal history of other diseases of the musculoskeletal system and connective tissue: Secondary | ICD-10-CM | POA: Insufficient documentation

## 2011-12-17 DIAGNOSIS — R109 Unspecified abdominal pain: Secondary | ICD-10-CM | POA: Insufficient documentation

## 2011-12-17 DIAGNOSIS — G8929 Other chronic pain: Secondary | ICD-10-CM | POA: Insufficient documentation

## 2011-12-17 DIAGNOSIS — Z8659 Personal history of other mental and behavioral disorders: Secondary | ICD-10-CM | POA: Insufficient documentation

## 2011-12-17 DIAGNOSIS — F319 Bipolar disorder, unspecified: Secondary | ICD-10-CM | POA: Insufficient documentation

## 2011-12-17 DIAGNOSIS — F17200 Nicotine dependence, unspecified, uncomplicated: Secondary | ICD-10-CM | POA: Insufficient documentation

## 2011-12-17 DIAGNOSIS — IMO0001 Reserved for inherently not codable concepts without codable children: Secondary | ICD-10-CM | POA: Insufficient documentation

## 2011-12-17 DIAGNOSIS — R5383 Other fatigue: Secondary | ICD-10-CM | POA: Insufficient documentation

## 2011-12-17 LAB — CBC WITH DIFFERENTIAL/PLATELET
Basophils Absolute: 0 10*3/uL (ref 0.0–0.1)
Eosinophils Relative: 3 % (ref 0–5)
HCT: 39 % (ref 36.0–46.0)
Hemoglobin: 13 g/dL (ref 12.0–15.0)
Lymphocytes Relative: 41 % (ref 12–46)
MCV: 95.8 fL (ref 78.0–100.0)
Monocytes Absolute: 0.3 10*3/uL (ref 0.1–1.0)
Monocytes Relative: 8 % (ref 3–12)
Neutro Abs: 2 10*3/uL (ref 1.7–7.7)
RDW: 12.7 % (ref 11.5–15.5)
WBC: 4.3 10*3/uL (ref 4.0–10.5)

## 2011-12-17 LAB — COMPREHENSIVE METABOLIC PANEL
BUN: 7 mg/dL (ref 6–23)
CO2: 30 mEq/L (ref 19–32)
Calcium: 9.5 mg/dL (ref 8.4–10.5)
Chloride: 105 mEq/L (ref 96–112)
Creatinine, Ser: 0.65 mg/dL (ref 0.50–1.10)
GFR calc Af Amer: >90 mL/min (ref >90)
GFR calc non Af Amer: >90 mL/min (ref >90)
Glucose, Bld: 100 mg/dL — ABNORMAL HIGH (ref 70–99)
Total Bilirubin: 0.3 mg/dL (ref 0.3–1.2)

## 2011-12-17 LAB — URINALYSIS, ROUTINE W REFLEX MICROSCOPIC
Glucose, UA: NEGATIVE mg/dL
Hgb urine dipstick: NEGATIVE
Ketones, ur: NEGATIVE mg/dL
Protein, ur: NEGATIVE mg/dL
pH: 6 (ref 5.0–8.0)

## 2011-12-17 LAB — LIPASE, BLOOD: Lipase: 17 U/L (ref 11–59)

## 2011-12-17 MED ORDER — TRAMADOL HCL 50 MG PO TABS: 50.0000 mg | ORAL_TABLET | Freq: Four times a day (QID) | ORAL | Status: DC | PRN

## 2011-12-17 MED ORDER — PROMETHAZINE HCL 25 MG/ML IJ SOLN
25.0000 mg | Freq: Once | INTRAMUSCULAR | Status: AC
  Administered 2011-12-17: 25 mg via INTRAVENOUS
  Filled 2011-12-17: qty 1

## 2011-12-17 MED ORDER — SODIUM CHLORIDE 0.9 % IV BOLUS (SEPSIS)
1000.0000 mL | Freq: Once | INTRAVENOUS | Status: AC
  Administered 2011-12-17: 1000 mL via INTRAVENOUS

## 2011-12-17 MED ORDER — IOHEXOL 300 MG/ML  SOLN
100.0000 mL | Freq: Once | INTRAMUSCULAR | Status: AC | PRN
  Administered 2011-12-17: 100 mL via INTRAVENOUS

## 2011-12-17 MED ORDER — PROMETHAZINE HCL 25 MG/ML IJ SOLN
12.5000 mg | Freq: Once | INTRAMUSCULAR | Status: AC
  Administered 2011-12-17: 12.5 mg via INTRAVENOUS
  Filled 2011-12-17: qty 1

## 2011-12-17 MED ORDER — MORPHINE SULFATE 4 MG/ML IJ SOLN
4.0000 mg | Freq: Once | INTRAMUSCULAR | Status: AC
  Administered 2011-12-17: 4 mg via INTRAVENOUS
  Filled 2011-12-17: qty 1

## 2011-12-17 NOTE — ED Notes (Signed)
Pt to CT

## 2011-12-17 NOTE — ED Notes (Signed)
Pt resting in bed, lights dim, awaiting ct scan.

## 2011-12-17 NOTE — ED Notes (Signed)
NVD, abd pain, Pain rt arm and shoulder,that radiates to hip.

## 2011-12-17 NOTE — ED Provider Notes (Signed)
History     CSN: [id number]  Arrival date & time 12/17/11  1248   First MD Initiated Contact with Patient 12/17/11 1315      Chief Complaint  Patient presents with  . Emesis    (Consider location/radiation/quality/duration/timing/severity/associated sxs/prior treatment) HPI Comments: Patient presents with multiple complaints including 2 weeks of nausea, vomiting, diarrhea and abdominal pain, right shoulder and arm pain after lifting her mother. No fever. She reports 2 episodes of nonbilious nonbloody emesis daily for the past 2 weeks with 3-4 episodes of loose nonbloody stools. She denies any fevers and states her dysuria is unchanged from her chronic interstitial cystitis. She has pain in the right side of her low back that radiates down her right leg without injury. No bowel or bladder incontinence. Denies any Vaginal bleeding or discharge. She did has no weakness in her arms or legs. She states that she feels better GI symptoms are making her fibromyalgia and chronic pain worse.  Denies any shortness of breath, chest pain or cough. She has multiple sick contacts at home  The history is provided by the patient.    Past Medical History  Diagnosis Date  . Interstitial cystitis   . Fibromyalgia   . Chronic fatigue   . Asthma   . Arthritis   . Bipolar 1 disorder   . PTSD (post-traumatic stress disorder)   . Lymphedema   . Panic disorder with agoraphobia   . Chronic pain   . Hiatal hernia   . Chronic headaches   . Chronic back pain     Past Surgical History  Procedure Date  . Cholecystectomy   . Abdominal hysterectomy   . Tonsillectomy   . Foot surgery   . Bariatric surgery   . Hemorrhoid surgery   . Hernia repair   . Esophagus surgery   . Appendectomy     History reviewed. No pertinent family history.  History  Substance Use Topics  . Smoking status: Current Every Day Smoker  . Smokeless tobacco: Not on file  . Alcohol Use: No    OB History    Grav Para Term  Preterm Abortions TAB SAB Ect Mult Living                  Review of Systems  Constitutional: Positive for activity change, appetite change and fatigue. Negative for fever.  HENT: Positive for congestion and rhinorrhea.   Eyes: Negative for visual disturbance.  Respiratory: Negative for cough, chest tightness and shortness of breath.   Cardiovascular: Negative for chest pain.  Gastrointestinal: Positive for nausea, vomiting, abdominal pain and diarrhea.  Genitourinary: Positive for hematuria. Negative for dysuria, vaginal bleeding and vaginal discharge.  Musculoskeletal: Positive for myalgias, back pain and arthralgias.  Skin: Negative for wound.  Neurological: Positive for weakness. Negative for headaches.    Allergies  Bee venom; Erythromycin; Haldol; Penicillins; Abilify; Aripiprazole; Ciprocin-fluocin-procin; Fexofenadine hcl; Iodine; Lithium; Marcaine; Paroxetine hcl; Pregabalin; Prozac; Remeron; Seroquel; Tape; Zofran; and Zonisamide  Home Medications   Current Outpatient Rx  Name  Route  Sig  Dispense  Refill  . ACETAMINOPHEN 500 MG PO TABS   Oral   Take 2,500 mg by mouth every 6 (six) hours as needed. For headache, pain         . ALBUTEROL SULFATE HFA 108 (90 BASE) MCG/ACT IN AERS   Inhalation   Inhale 1 puff into the lungs every 6 (six) hours as needed. For shortness of breath         .  ANTIPYRINE-BENZOCAINE 5.4-1.4 % OT SOLN      3 drops 4 (four) times daily as needed. Ear ache and throat pain         . DOXEPIN HCL 75 MG PO CAPS   Oral   Take 150 mg by mouth at bedtime.         . ESTROGENS CONJUGATED 0.625 MG PO TABS   Oral   Take 0.625 mg by mouth daily.         . OMEGA-3 FATTY ACIDS 1000 MG PO CAPS   Oral   Take 1 g by mouth daily.         . FUROSEMIDE 40 MG PO TABS   Oral   Take 40 mg by mouth daily.         . IBUPROFEN 200 MG PO TABS   Oral   Take 1,000 mg by mouth every 8 (eight) hours as needed. pain         . LUBIPROSTONE 24  MCG PO CAPS   Oral   Take 24 mcg by mouth 2 (two) times daily.         Marland Kitchen MONTELUKAST SODIUM 10 MG PO TABS   Oral   Take 10 mg by mouth at bedtime.         . OXYCODONE HCL ER 10 MG PO TB12   Oral   Take 10 mg by mouth 3 (three) times daily as needed. For pain         . OXYMORPHONE HCL ER 10 MG PO TB12   Oral   Take 20 mg by mouth every 12 (twelve) hours.         Marland Kitchen PROBIOTIC FORMULA PO   Oral   Take 1 tablet by mouth 2 (two) times daily.         Marland Kitchen PROMETHAZINE HCL 25 MG PO TABS   Oral   Take 25 mg by mouth 3 (three) times daily. Stomach pain and nausea         . RIZATRIPTAN BENZOATE 10 MG PO TBDP   Oral   Take 10 mg by mouth as needed. May repeat in 2 hours if needed, maximum of 3 tablets in a 24 hour period         . TAMSULOSIN HCL 0.4 MG PO CAPS   Oral   Take 0.4 mg by mouth daily.           BP 103/41  Pulse 82  Temp 98.3 F (36.8 C) (Oral)  Resp 20  Ht 5\' 8"  (1.727 m)  Wt 185 lb (83.915 kg)  BMI 28.13 kg/m2  SpO2 100%  Physical Exam  Constitutional: She is oriented to person, place, and time. She appears well-developed. No distress.  HENT:  Head: Normocephalic and atraumatic.  Mouth/Throat: Oropharynx is clear and moist. No oropharyngeal exudate.  Eyes: Conjunctivae normal and EOM are normal. Pupils are equal, round, and reactive to light.  Neck: Normal range of motion. Neck supple.       No meningimus  Cardiovascular: Normal rate, regular rhythm and normal heart sounds.   No murmur heard. Pulmonary/Chest: Effort normal and breath sounds normal. No respiratory distress.  Abdominal: Soft. There is tenderness. There is no rebound and no guarding.  Musculoskeletal: Normal range of motion. She exhibits tenderness.       TTP R trapezius and R paraspinal muscles. Equal grip strengths bilaterally. TTP R paraspinal lumbar  5/5 strength in bilateral lower extremities. Ankle plantar and dorsiflexion intact. Great toe  extension intact bilaterally. +2  DP and PT pulses. +2 patellar reflexes bilaterally. Normal gait.   Neurological: She is alert and oriented to person, place, and time. No cranial nerve deficit. Coordination normal.  Skin: Skin is warm.    ED Course  Procedures (including critical care time)  Labs Reviewed  URINALYSIS, ROUTINE W REFLEX MICROSCOPIC - Abnormal; Notable for the following:    Specific Gravity, Urine <1.005 (*)     All other components within normal limits  COMPREHENSIVE METABOLIC PANEL - Abnormal; Notable for the following:    Glucose, Bld 100 (*)     Alkaline Phosphatase 173 (*)     All other components within normal limits  LACTIC ACID, PLASMA  CBC WITH DIFFERENTIAL  LIPASE, BLOOD   Dg Abd Acute W/chest  12/17/2011  *RADIOLOGY REPORT*  Clinical Data: Nausea, vomiting and diarrhea for 2 weeks.  ACUTE ABDOMEN SERIES (ABDOMEN 2 VIEW & CHEST 1 VIEW)  Comparison: CT abdomen and pelvis 02/15/2011.  Chest and two views abdomen 09/26/2010.  Findings: Single view of the chest demonstrates clear lungs and normal heart size.  No pneumothorax or pleural fluid.  Two views of the abdomen show no free intraperitoneal air.  Dilated loop of small bowel in the left upper quadrant measures 5.8 cm. There is some gas and stool in the colon. Multiple surgical clips are seen in the upper abdomen.  IMPRESSION:  1.  Dilated loop of small bowel in the left upper quadrant could be due to focal inflammatory process or possibly partial obstruction. CT scan with contrast could be used for further evaluation. 2.  No acute cardiopulmonary disease.   Original Report Authenticated By: Holley Dexter, M.D.      1. Nausea and vomiting   2. Abdominal pain       MDM  2 weeks of nausea, vomiting, diarrhea, abdominal pain, pain in right shoulder and arm. No chest pain or shortness of breath. Pain improved.  No evidence of cord compression or cauda equina.  CBC and lites unremarkable. There is a dilated bowel loops on x-ray concerning  for possible early obstruction.  CT pending at time of sign out to Dr. Deretha Emory.   Date: 12/17/2011  Rate: 65  Rhythm: normal sinus rhythm  QRS Axis: normal  Intervals: normal  ST/T Wave abnormalities: normal  Conduction Disutrbances:none  Narrative Interpretation:   Old EKG Reviewed: none available       Glynn Octave, MD 12/17/11 1611

## 2011-12-17 NOTE — ED Notes (Signed)
Pt medicated for nausea. Pt requesting crackers and ginger ale. Explained to pt that she would have to wait to let medication work and then she may be able to have something pending CT result.

## 2011-12-17 NOTE — ED Provider Notes (Signed)
CT scan was negative. He did show a brief moment of some intussusception but does not fit the patient's clinical pain complaint is been going on for 3 weeks. Intussusception did resolve itself. Patient given resource guide to help her find a local Dr. Maryclare Labrador treat with tramadol for the fibromyalgia and the muscular type pain.  Results for orders placed during the hospital encounter of 12/17/11  LACTIC ACID, PLASMA      Component Value Range   Lactic Acid, Venous 1.1  0.5 - 2.2 mmol/L  URINALYSIS, ROUTINE W REFLEX MICROSCOPIC      Component Value Range   Color, Urine YELLOW  YELLOW   APPearance CLEAR  CLEAR   Specific Gravity, Urine <1.005 (*) 1.005 - 1.030   pH 6.0  5.0 - 8.0   Glucose, UA NEGATIVE  NEGATIVE mg/dL   Hgb urine dipstick NEGATIVE  NEGATIVE   Bilirubin Urine NEGATIVE  NEGATIVE   Ketones, ur NEGATIVE  NEGATIVE mg/dL   Protein, ur NEGATIVE  NEGATIVE mg/dL   Urobilinogen, UA 0.2  0.0 - 1.0 mg/dL   Nitrite NEGATIVE  NEGATIVE   Leukocytes, UA NEGATIVE  NEGATIVE  CBC WITH DIFFERENTIAL      Component Value Range   WBC 4.3  4.0 - 10.5 K/uL   RBC 4.07  3.87 - 5.11 MIL/uL   Hemoglobin 13.0  12.0 - 15.0 g/dL   HCT 16.1  09.6 - 04.5 %   MCV 95.8  78.0 - 100.0 fL   MCH 31.9  26.0 - 34.0 pg   MCHC 33.3  30.0 - 36.0 g/dL   RDW 40.9  81.1 - 91.4 %   Platelets 233  150 - 400 K/uL   Neutrophils Relative 47  43 - 77 %   Neutro Abs 2.0  1.7 - 7.7 K/uL   Lymphocytes Relative 41  12 - 46 %   Lymphs Abs 1.8  0.7 - 4.0 K/uL   Monocytes Relative 8  3 - 12 %   Monocytes Absolute 0.3  0.1 - 1.0 K/uL   Eosinophils Relative 3  0 - 5 %   Eosinophils Absolute 0.1  0.0 - 0.7 K/uL   Basophils Relative 1  0 - 1 %   Basophils Absolute 0.0  0.0 - 0.1 K/uL  COMPREHENSIVE METABOLIC PANEL      Component Value Range   Sodium 141  135 - 145 mEq/L   Potassium 4.1  3.5 - 5.1 mEq/L   Chloride 105  96 - 112 mEq/L   CO2 30  19 - 32 mEq/L   Glucose, Bld 100 (*) 70 - 99 mg/dL   BUN 7  6 - 23 mg/dL   Creatinine, Ser 7.82  0.50 - 1.10 mg/dL   Calcium 9.5  8.4 - 95.6 mg/dL   Total Protein 6.2  6.0 - 8.3 g/dL   Albumin 3.5  3.5 - 5.2 g/dL   AST 18  0 - 37 U/L   ALT 15  0 - 35 U/L   Alkaline Phosphatase 173 (*) 39 - 117 U/L   Total Bilirubin 0.3  0.3 - 1.2 mg/dL   GFR calc non Af Amer >90  >90 mL/min   GFR calc Af Amer >90  >90 mL/min  LIPASE, BLOOD      Component Value Range   Lipase 17  11 - 59 U/L   Ct Abdomen Pelvis W Contrast  12/17/2011  *RADIOLOGY REPORT*  Clinical Data: Abdominal pain.  History of cholecystectomy, appendectomy, and hysterectomy.  CT  ABDOMEN AND PELVIS WITH CONTRAST  Technique:  Multidetector CT imaging of the abdomen and pelvis was performed following the standard protocol during bolus administration of intravenous contrast.  Contrast: OMNIPAQUE IOHEXOL 300 MG/ML  SOLN  Comparison: 02/15/2011  Findings: Mild intrahepatic biliary duct dilatation again noted without substantial interval change.  Extrahepatic common duct measures 1.6 cm in diameter, stable. No focal abnormalities seen in the liver or spleen.  Postsurgical changes noted in the region of the proximal stomach. Duodenum is unremarkable.  Pancreas is somewhat atrophic, but stable.  Adrenal glands are normal.  No abnormalities seen in either kidney.  No abdominal aortic aneurysm.  No free fluid or lymphadenopathy in the abdomen.  In the left abdomen, there is a short segment of entero-enteric intussusception (see image 36 series 2).  This has resolved on the renal delay images obtained 4 minutes later, consistent with a transient process.  This area of transient intussusception is nonobstructing as oral contrast material has migrated distal to this region.  There is no evidence for associated small bowel wall thickening or perienteric edema/fluid.  Imaging through the pelvis shows no free intraperitoneal fluid. There is no pelvic sidewall lymphadenopathy.  Uterus is surgically absent.  No adnexal mass.  Bladder  is unremarkable.  No substantial diverticular change in the colon.  No evidence for colonic diverticulitis.  Terminal ileum is unremarkable. Nonvisualization of the appendix is consistent with the reported history of appendectomy.  Bone windows reveal no worrisome lytic or sclerotic osseous lesions.  IMPRESSION: No findings to explain the patient's history of abdominal pain.  A very short segment of uncomplicated jejunal entero-enteric intussusception is identified in the left upper quadrant on the initial series, but has resolved completely by acquisition of the renal delay sequence 4 minutes later, consistent with a transient process.   Original Report Authenticated By: Kennith Center, M.D.    Dg Abd Acute W/chest  12/17/2011  *RADIOLOGY REPORT*  Clinical Data: Nausea, vomiting and diarrhea for 2 weeks.  ACUTE ABDOMEN SERIES (ABDOMEN 2 VIEW & CHEST 1 VIEW)  Comparison: CT abdomen and pelvis 02/15/2011.  Chest and two views abdomen 09/26/2010.  Findings: Single view of the chest demonstrates clear lungs and normal heart size.  No pneumothorax or pleural fluid.  Two views of the abdomen show no free intraperitoneal air.  Dilated loop of small bowel in the left upper quadrant measures 5.8 cm. There is some gas and stool in the colon. Multiple surgical clips are seen in the upper abdomen.  IMPRESSION:  1.  Dilated loop of small bowel in the left upper quadrant could be due to focal inflammatory process or possibly partial obstruction. CT scan with contrast could be used for further evaluation. 2.  No acute cardiopulmonary disease.   Original Report Authenticated By: Holley Dexter, M.D.       Shelda Jakes, MD 12/17/11 [phone]

## 2011-12-17 NOTE — ED Notes (Signed)
Pt reports nausea and vomiting for 2 weeks, also having right shoulder/neck pain for 2 weeks. Pain in shoulder/neck began after moving her mother. Has not been to pmd for same.  Drinking ginger ale at arrival. Told no further eating or drinking.  Given warm blankets, lights dimmed in exam room.  Pt resting after meds.

## 2012-01-25 ENCOUNTER — Other Ambulatory Visit: Payer: Self-pay

## 2012-01-25 ENCOUNTER — Emergency Department (HOSPITAL_COMMUNITY)
Admission: EM | Admit: 2012-01-25 | Discharge: 2012-01-26 | Disposition: A | Discharge status: Home / Self Care | Payer: Medicare Other | Attending: Emergency Medicine | Admitting: Emergency Medicine

## 2012-01-25 ENCOUNTER — Emergency Department (HOSPITAL_COMMUNITY): Payer: Medicare Other

## 2012-01-25 ENCOUNTER — Encounter (HOSPITAL_COMMUNITY): Payer: Self-pay

## 2012-01-25 DIAGNOSIS — R0789 Other chest pain: Secondary | ICD-10-CM | POA: Insufficient documentation

## 2012-01-25 DIAGNOSIS — F17200 Nicotine dependence, unspecified, uncomplicated: Secondary | ICD-10-CM | POA: Insufficient documentation

## 2012-01-25 DIAGNOSIS — Z8659 Personal history of other mental and behavioral disorders: Secondary | ICD-10-CM | POA: Insufficient documentation

## 2012-01-25 DIAGNOSIS — G8929 Other chronic pain: Secondary | ICD-10-CM | POA: Insufficient documentation

## 2012-01-25 DIAGNOSIS — R5382 Chronic fatigue, unspecified: Secondary | ICD-10-CM | POA: Insufficient documentation

## 2012-01-25 DIAGNOSIS — M542 Cervicalgia: Secondary | ICD-10-CM | POA: Insufficient documentation

## 2012-01-25 DIAGNOSIS — Z8719 Personal history of other diseases of the digestive system: Secondary | ICD-10-CM | POA: Insufficient documentation

## 2012-01-25 DIAGNOSIS — F319 Bipolar disorder, unspecified: Secondary | ICD-10-CM | POA: Insufficient documentation

## 2012-01-25 DIAGNOSIS — R0602 Shortness of breath: Secondary | ICD-10-CM | POA: Insufficient documentation

## 2012-01-25 DIAGNOSIS — M79609 Pain in unspecified limb: Secondary | ICD-10-CM | POA: Insufficient documentation

## 2012-01-25 DIAGNOSIS — Z8739 Personal history of other diseases of the musculoskeletal system and connective tissue: Secondary | ICD-10-CM | POA: Insufficient documentation

## 2012-01-25 DIAGNOSIS — R209 Unspecified disturbances of skin sensation: Secondary | ICD-10-CM | POA: Insufficient documentation

## 2012-01-25 DIAGNOSIS — J45909 Unspecified asthma, uncomplicated: Secondary | ICD-10-CM | POA: Insufficient documentation

## 2012-01-25 DIAGNOSIS — R51 Headache: Secondary | ICD-10-CM | POA: Insufficient documentation

## 2012-01-25 DIAGNOSIS — IMO0001 Reserved for inherently not codable concepts without codable children: Secondary | ICD-10-CM | POA: Insufficient documentation

## 2012-01-25 DIAGNOSIS — Z8679 Personal history of other diseases of the circulatory system: Secondary | ICD-10-CM | POA: Insufficient documentation

## 2012-01-25 DIAGNOSIS — M549 Dorsalgia, unspecified: Secondary | ICD-10-CM | POA: Insufficient documentation

## 2012-01-25 DIAGNOSIS — Z8742 Personal history of other diseases of the female genital tract: Secondary | ICD-10-CM | POA: Insufficient documentation

## 2012-01-25 DIAGNOSIS — G9332 Myalgic encephalomyelitis/chronic fatigue syndrome: Secondary | ICD-10-CM | POA: Insufficient documentation

## 2012-01-25 DIAGNOSIS — R61 Generalized hyperhidrosis: Secondary | ICD-10-CM | POA: Insufficient documentation

## 2012-01-25 DIAGNOSIS — F410 Panic disorder [episodic paroxysmal anxiety] without agoraphobia: Secondary | ICD-10-CM | POA: Insufficient documentation

## 2012-01-25 DIAGNOSIS — Z79899 Other long term (current) drug therapy: Secondary | ICD-10-CM | POA: Insufficient documentation

## 2012-01-25 DIAGNOSIS — R110 Nausea: Secondary | ICD-10-CM | POA: Insufficient documentation

## 2012-01-25 LAB — BASIC METABOLIC PANEL
BUN: 5 mg/dL — ABNORMAL LOW (ref 6–23)
CO2: 28 mEq/L (ref 19–32)
Calcium: 9.2 mg/dL (ref 8.4–10.5)
Creatinine, Ser: 0.72 mg/dL (ref 0.50–1.10)
GFR calc non Af Amer: >90 mL/min (ref >90)
Glucose, Bld: 115 mg/dL — ABNORMAL HIGH (ref 70–99)

## 2012-01-25 MED ORDER — LORAZEPAM 1 MG PO TABS
1.0000 mg | ORAL_TABLET | Freq: Once | ORAL | Status: AC
  Administered 2012-01-25: 1 mg via ORAL
  Filled 2012-01-25: qty 1

## 2012-01-25 MED ORDER — HYDROCODONE-ACETAMINOPHEN 5-325 MG PO TABS
1.0000 | ORAL_TABLET | Freq: Once | ORAL | Status: AC
  Administered 2012-01-25: 1 via ORAL
  Filled 2012-01-25: qty 1

## 2012-01-25 MED ORDER — PROMETHAZINE HCL 12.5 MG PO TABS
12.5000 mg | ORAL_TABLET | Freq: Once | ORAL | Status: AC
  Administered 2012-01-25: 12.5 mg via ORAL
  Filled 2012-01-25: qty 1

## 2012-01-25 NOTE — ED Notes (Signed)
Onset of left arm tingling and numbness around 715pm, then chest pressure started shortly after, slight nausea. No sweating. Thought it was a panic attack at first but the arm/neck pain is new.

## 2012-01-25 NOTE — ED Notes (Signed)
Pt states she's feeling better, now remembers that she's been packing up her house and moving boxes into storage this week.

## 2012-01-25 NOTE — ED Notes (Signed)
MD at bedside. 

## 2012-01-25 NOTE — ED Provider Notes (Signed)
History     CSN: [id number]  Arrival date & time 01/25/12  2205   First MD Initiated Contact with Patient 01/25/12 2217      Chief Complaint  Patient presents with  . Chest Pain    (Consider location/radiation/quality/duration/timing/severity/associated sxs/prior treatment) Patient is a 51 y.o. female presenting with chest pain. The history is provided by the patient.  Chest Pain Duration of episode(s) is 6 minutes. Chest pain occurs frequently. The chest pain is worsening. The pain is associated with stress. At its most intense, the pain is at 7/10. The quality of the pain is described as tightness and aching. The pain radiates to the right arm. Chest pain is worsened by stress. Primary symptoms include nausea. Pertinent negatives for primary symptoms include no fever, no fatigue, no syncope, no shortness of breath, no cough, no wheezing, no palpitations, no abdominal pain and no dizziness.  Associated symptoms include numbness.  Pertinent negatives for associated symptoms include no diaphoresis, no lower extremity edema and no near-syncope. She tried nitroglycerin and aspirin (NtG and aspirin given by EMS) for the symptoms. Risk factors include stress and post-menopausal.  Her past medical history is significant for anxiety/panic attacks and arrhythmia.  Pertinent negatives for past medical history include no CAD, no pacemaker and no seizures. Past medical history comments: gastric bypass surg., asthma  Her family medical history is significant for hypertension in family.  Procedure history is positive for echocardiogram and persantine thallium.  Procedure history is negative for cardiac catheterization, stress echo, stress thallium and exercise treadmill test.     Past Medical History  Diagnosis Date  . Interstitial cystitis   . Fibromyalgia   . Chronic fatigue   . Asthma   . Arthritis   . Bipolar 1 disorder   . PTSD (post-traumatic stress disorder)   . Lymphedema   . Panic  disorder with agoraphobia   . Chronic pain   . Hiatal hernia   . Chronic headaches   . Chronic back pain     Past Surgical History  Procedure Date  . Cholecystectomy   . Abdominal hysterectomy   . Tonsillectomy   . Foot surgery   . Bariatric surgery   . Hemorrhoid surgery   . Hernia repair   . Esophagus surgery   . Appendectomy     No family history on file.  History  Substance Use Topics  . Smoking status: Current Every Day Smoker  . Smokeless tobacco: Not on file  . Alcohol Use: No    OB History    Grav Para Term Preterm Abortions TAB SAB Ect Mult Living                  Review of Systems  Constitutional: Negative for fever, diaphoresis, activity change and fatigue.       All ROS Neg except as noted in HPI  HENT: Negative for nosebleeds and neck pain.   Eyes: Negative for photophobia and discharge.  Respiratory: Negative for cough, shortness of breath and wheezing.   Cardiovascular: Positive for chest pain. Negative for palpitations, syncope and near-syncope.  Gastrointestinal: Positive for nausea. Negative for abdominal pain and blood in stool.  Genitourinary: Negative for dysuria, frequency and hematuria.  Musculoskeletal: Negative for back pain and arthralgias.  Skin: Negative.   Neurological: Positive for numbness. Negative for dizziness, seizures and speech difficulty.  Psychiatric/Behavioral: Negative for hallucinations and confusion. The patient is nervous/anxious.     Allergies  Bee venom; Erythromycin; Haldol; Penicillins; Abilify; Aripiprazole; Ciprocin-fluocin-procin;  Fexofenadine hcl; Iodine; Lithium; Marcaine; Paroxetine hcl; Pregabalin; Prozac; Remeron; Seroquel; Tape; Zofran; and Zonisamide  Home Medications   Current Outpatient Rx  Name  Route  Sig  Dispense  Refill  . ACETAMINOPHEN 500 MG PO TABS   Oral   Take 2,500 mg by mouth every 6 (six) hours as needed. For headache, pain         . ALBUTEROL SULFATE HFA 108 (90 BASE) MCG/ACT IN  AERS   Inhalation   Inhale 1 puff into the lungs every 6 (six) hours as needed. For shortness of breath         . ANTIPYRINE-BENZOCAINE 5.4-1.4 % OT SOLN      3 drops 4 (four) times daily as needed. Ear ache and throat pain         . DOXEPIN HCL 75 MG PO CAPS   Oral   Take 150 mg by mouth at bedtime.         . ESTROGENS CONJUGATED 0.625 MG PO TABS   Oral   Take 0.625 mg by mouth daily.         . OMEGA-3 FATTY ACIDS 1000 MG PO CAPS   Oral   Take 1 g by mouth daily.         . FUROSEMIDE 40 MG PO TABS   Oral   Take 40 mg by mouth daily.         . IBUPROFEN 200 MG PO TABS   Oral   Take 1,000 mg by mouth every 8 (eight) hours as needed. pain         . LUBIPROSTONE 24 MCG PO CAPS   Oral   Take 24 mcg by mouth 2 (two) times daily.         Marland Kitchen MONTELUKAST SODIUM 10 MG PO TABS   Oral   Take 10 mg by mouth at bedtime.         . OXYCODONE HCL ER 10 MG PO TB12   Oral   Take 10 mg by mouth 3 (three) times daily as needed. For pain         . OXYMORPHONE HCL ER 10 MG PO TB12   Oral   Take 20 mg by mouth every 12 (twelve) hours.         Marland Kitchen PROBIOTIC FORMULA PO   Oral   Take 1 tablet by mouth 2 (two) times daily.         Marland Kitchen PROMETHAZINE HCL 25 MG PO TABS   Oral   Take 25 mg by mouth 3 (three) times daily. Stomach pain and nausea         . RIZATRIPTAN BENZOATE 10 MG PO TBDP   Oral   Take 10 mg by mouth as needed. May repeat in 2 hours if needed, maximum of 3 tablets in a 24 hour period         . TAMSULOSIN HCL 0.4 MG PO CAPS   Oral   Take 0.4 mg by mouth daily.         . TRAMADOL HCL 50 MG PO TABS   Oral   Take 1 tablet (50 mg total) by mouth every 6 (six) hours as needed for pain.   20 tablet   0     There were no vitals taken for this visit.  Physical Exam  Nursing note and vitals reviewed. Constitutional: She is oriented to person, place, and time. She appears well-developed and well-nourished.  Non-toxic appearance.  HENT:  Head:  Normocephalic.  Right Ear: Tympanic membrane and external ear normal.  Left Ear: Tympanic membrane and external ear normal.  Eyes: EOM and lids are normal. Pupils are equal, round, and reactive to light.  Neck: Normal range of motion. Neck supple. Carotid bruit is not present.  Cardiovascular: Normal rate, regular rhythm, normal heart sounds, intact distal pulses and normal pulses.   Pulmonary/Chest: Breath sounds normal. No respiratory distress.       Mild to mod left anterior chest wall pain.  Abdominal: Soft. Bowel sounds are normal. There is no tenderness. There is no guarding.  Musculoskeletal: Normal range of motion.  Lymphadenopathy:       Head (right side): No submandibular adenopathy present.       Head (left side): No submandibular adenopathy present.    She has no cervical adenopathy.  Neurological: She is alert and oriented to person, place, and time. She has normal strength. No cranial nerve deficit or sensory deficit. She exhibits normal muscle tone. Coordination normal.  Skin: Skin is warm and dry. No pallor.  Psychiatric: Her speech is normal. Her mood appears anxious.    ED Course  Procedures (including critical care time)  Labs Reviewed - No data to display No results found.   No diagnosis found.    MDM  I have reviewed nursing notes, vital signs, and all appropriate lab and imaging results for this patient. Patient states that at approximately 7:45 PM tonight she had a sensation of numbness and tingling in the right arm followed by a chest pain lasted 6 minute intervals. Was associated with chest tightness and this was followed by right neck and arm pain. The patient denies any loss of consciousness. She states she did have a short episodes of sweats and shortness of breath. She was brought to the emergency department by EMS.  The chest x-ray is read by the radiologist and reviewed by me as negative. Trop <.30 . The bmet is negative for acute problem. Pt feeling  much better after ativan and norco.  Vital signs are wnl. Low suspicion for PE, PERC neg. EKG reveals NSR, No STEMI. Feel it is safe for pt to d/c home with follow up by her PCP in [place].       Kathie Dike, [place] 01/26/12 Jacinta Shoe

## 2012-01-25 NOTE — ED Notes (Signed)
Pt was given 324 of asa, 1 nitro by ems pta.

## 2012-01-26 NOTE — ED Provider Notes (Signed)
Medical screening examination/treatment/procedure(s) were performed by non-physician practitioner and as supervising physician I was immediately available for consultation/collaboration.   Gerhard Munch, MD 01/26/12 2108

## 2012-10-10 DIAGNOSIS — F419 Anxiety disorder, unspecified: Secondary | ICD-10-CM | POA: Insufficient documentation

## 2012-11-13 ENCOUNTER — Encounter (HOSPITAL_COMMUNITY): Payer: Self-pay | Admitting: Emergency Medicine

## 2012-11-13 ENCOUNTER — Emergency Department (HOSPITAL_COMMUNITY)
Admission: EM | Admit: 2012-11-13 | Discharge: 2012-11-13 | Disposition: A | Discharge status: Home / Self Care | Payer: Medicare Other | Attending: Emergency Medicine | Admitting: Emergency Medicine

## 2012-11-13 ENCOUNTER — Emergency Department (HOSPITAL_COMMUNITY): Payer: Medicare Other

## 2012-11-13 DIAGNOSIS — F17200 Nicotine dependence, unspecified, uncomplicated: Secondary | ICD-10-CM | POA: Insufficient documentation

## 2012-11-13 DIAGNOSIS — M6281 Muscle weakness (generalized): Secondary | ICD-10-CM | POA: Insufficient documentation

## 2012-11-13 DIAGNOSIS — IMO0001 Reserved for inherently not codable concepts without codable children: Secondary | ICD-10-CM | POA: Insufficient documentation

## 2012-11-13 DIAGNOSIS — Z8679 Personal history of other diseases of the circulatory system: Secondary | ICD-10-CM | POA: Insufficient documentation

## 2012-11-13 DIAGNOSIS — R51 Headache: Secondary | ICD-10-CM | POA: Insufficient documentation

## 2012-11-13 DIAGNOSIS — Z880 Allergy status to penicillin: Secondary | ICD-10-CM | POA: Insufficient documentation

## 2012-11-13 DIAGNOSIS — M549 Dorsalgia, unspecified: Secondary | ICD-10-CM | POA: Insufficient documentation

## 2012-11-13 DIAGNOSIS — F4310 Post-traumatic stress disorder, unspecified: Secondary | ICD-10-CM | POA: Insufficient documentation

## 2012-11-13 DIAGNOSIS — H538 Other visual disturbances: Secondary | ICD-10-CM | POA: Insufficient documentation

## 2012-11-13 DIAGNOSIS — Z87448 Personal history of other diseases of urinary system: Secondary | ICD-10-CM | POA: Insufficient documentation

## 2012-11-13 DIAGNOSIS — M542 Cervicalgia: Secondary | ICD-10-CM | POA: Insufficient documentation

## 2012-11-13 DIAGNOSIS — Z79899 Other long term (current) drug therapy: Secondary | ICD-10-CM | POA: Insufficient documentation

## 2012-11-13 DIAGNOSIS — G8929 Other chronic pain: Secondary | ICD-10-CM | POA: Insufficient documentation

## 2012-11-13 DIAGNOSIS — Z8719 Personal history of other diseases of the digestive system: Secondary | ICD-10-CM | POA: Insufficient documentation

## 2012-11-13 DIAGNOSIS — F319 Bipolar disorder, unspecified: Secondary | ICD-10-CM

## 2012-11-13 DIAGNOSIS — F438 Other reactions to severe stress: Secondary | ICD-10-CM | POA: Insufficient documentation

## 2012-11-13 DIAGNOSIS — F4389 Other reactions to severe stress: Secondary | ICD-10-CM | POA: Insufficient documentation

## 2012-11-13 DIAGNOSIS — J45909 Unspecified asthma, uncomplicated: Secondary | ICD-10-CM | POA: Insufficient documentation

## 2012-11-13 DIAGNOSIS — F411 Generalized anxiety disorder: Secondary | ICD-10-CM | POA: Insufficient documentation

## 2012-11-13 DIAGNOSIS — F430 Acute stress reaction: Secondary | ICD-10-CM

## 2012-11-13 DIAGNOSIS — M129 Arthropathy, unspecified: Secondary | ICD-10-CM | POA: Insufficient documentation

## 2012-11-13 LAB — CBC WITH DIFFERENTIAL/PLATELET
Basophils Absolute: 0 10*3/uL (ref 0.0–0.1)
HCT: 37.1 % (ref 36.0–46.0)
Lymphocytes Relative: 46 % (ref 12–46)
Lymphs Abs: 1.6 10*3/uL (ref 0.7–4.0)
Monocytes Absolute: 0.2 10*3/uL (ref 0.1–1.0)
Neutro Abs: 1.6 10*3/uL — ABNORMAL LOW (ref 1.7–7.7)
RBC: 3.92 MIL/uL (ref 3.87–5.11)
RDW: 12.6 % (ref 11.5–15.5)
WBC: 3.5 10*3/uL — ABNORMAL LOW (ref 4.0–10.5)

## 2012-11-13 LAB — URINALYSIS, ROUTINE W REFLEX MICROSCOPIC
Bilirubin Urine: NEGATIVE
Leukocytes, UA: NEGATIVE
Nitrite: NEGATIVE
Specific Gravity, Urine: 1.01 (ref 1.005–1.030)
pH: 5.5 (ref 5.0–8.0)

## 2012-11-13 LAB — COMPREHENSIVE METABOLIC PANEL
Alkaline Phosphatase: 131 U/L — ABNORMAL HIGH (ref 39–117)
BUN: 6 mg/dL (ref 6–23)
Calcium: 8.9 mg/dL (ref 8.4–10.5)
Creatinine, Ser: 0.83 mg/dL (ref 0.50–1.10)
GFR calc Af Amer: >90 mL/min (ref >90)
Glucose, Bld: 129 mg/dL — ABNORMAL HIGH (ref 70–99)
Potassium: 3.9 mEq/L (ref 3.5–5.1)
Total Protein: 5.7 g/dL — ABNORMAL LOW (ref 6.0–8.3)

## 2012-11-13 LAB — CK: Total CK: 43 U/L (ref 7–177)

## 2012-11-13 LAB — AMMONIA: Ammonia: 10 umol/L — ABNORMAL LOW (ref 11–60)

## 2012-11-13 LAB — GLUCOSE, CAPILLARY: Glucose-Capillary: 117 mg/dL — ABNORMAL HIGH (ref 70–99)

## 2012-11-13 LAB — RAPID URINE DRUG SCREEN, HOSP PERFORMED: Barbiturates: NOT DETECTED

## 2012-11-13 LAB — SALICYLATE LEVEL: Salicylate Lvl: <2 mg/dL — ABNORMAL LOW (ref 2.8–20.0)

## 2012-11-13 MED ORDER — NICOTINE 21 MG/24HR TD PT24
21.0000 mg | MEDICATED_PATCH | Freq: Every day | TRANSDERMAL | Status: DC
  Filled 2012-11-13: qty 1

## 2012-11-13 MED ORDER — IBUPROFEN 400 MG PO TABS: 600.0000 mg | ORAL_TABLET | Freq: Three times a day (TID) | ORAL | Status: DC | PRN

## 2012-11-13 MED ORDER — LORAZEPAM 1 MG PO TABS: 1.0000 mg | ORAL_TABLET | Freq: Three times a day (TID) | ORAL | Status: DC | PRN

## 2012-11-13 NOTE — ED Notes (Addendum)
Reported that pt wandering outside, looking for her children (who are grown) and pt thinks [place] is at the beach; pt alert and oriented to name only, not able to state month or year or place; pt denies SI or HI; reported that mother passed away yesterday

## 2012-11-13 NOTE — ED Notes (Signed)
Pt has signed for discharge. Awaiting fax from consult.

## 2012-11-13 NOTE — ED Notes (Signed)
MD at bedside. 

## 2012-11-13 NOTE — ED Notes (Signed)
Information given to pt as requested by MD. Pt verbalized understanding. Consent for release of medical information signed. Pt discharged.

## 2012-11-13 NOTE — ED Notes (Signed)
Pt refuses to change into paper scrubs and security called and wanded pt

## 2012-11-13 NOTE — ED Provider Notes (Signed)
CSN: [id number]     Arrival date & time 11/13/12  1258 History   This chart was scribed for Vicki Gaskins, MD by Bennett Scrape, ED Scribe. This patient was seen in room APA15/APA15 and the patient's care was started at 1:33 PM.     Chief Complaint  Patient presents with  . V70.1   Level 5 Caveat AMS  The history is provided by the patient. No language interpreter was used.    HPI Comments: Vicki Thomas is a 52 y.o. female who presents to the Emergency Department complaining of AMS after being found wondering around outside today. Pt states that she woke up and noted that she couldn't find her mother, daughter (39 year old) and son (67 year old). Per ED nurse report, her children are grown. Pt states that she walked outside and asked a stranger named "[name]" to help at which point she was told that [place] lived [place]. She states that she lives in [place] and never moved here. She also c/o HA for the past several days, mild blurred vision, upper back pain between the shoulder blades, neck pain associated with the HAs and anxiety described as a feeling of impending doom. She also reports right leg weakness that has been chronic for the past several years. She denies CP, SOB, emesis, diarrhea and loss of vision as associated symptoms. When asked, the pt states that the year is 2006 and the president is [employer]. When asked where she lives, she lists a [facility] address.   Past Medical History  Diagnosis Date  . Interstitial cystitis   . Fibromyalgia   . Chronic fatigue   . Asthma   . Arthritis   . Bipolar 1 disorder   . PTSD (post-traumatic stress disorder)   . Lymphedema   . Panic disorder with agoraphobia   . Chronic pain   . Hiatal hernia   . Chronic headaches   . Chronic back pain    Past Surgical History  Procedure Laterality Date  . Cholecystectomy    . Abdominal hysterectomy    . Tonsillectomy    . Foot surgery    . Bariatric surgery    .  Hemorrhoid surgery    . Hernia repair    . Esophagus surgery    . Appendectomy     History reviewed. No pertinent family history. History  Substance Use Topics  . Smoking status: Current Every Day Smoker  . Smokeless tobacco: Not on file  . Alcohol Use: No   No OB history provided.   Review of Systems  Unable to perform ROS: Mental status change    Allergies  Bee venom; Erythromycin; Haldol; Penicillins; Abilify; Aripiprazole; Ciprocin-fluocin-procin; Fexofenadine hcl; Gabapentin; Iodine; Lithium; Marcaine; Paroxetine hcl; Pregabalin; Prozac; Remeron; Seroquel; Tape; Zofran; and Zonisamide  Home Medications   Current Outpatient Rx  Name  Route  Sig  Dispense  Refill  . DULoxetine (CYMBALTA) 60 MG capsule   Oral   Take 60 mg by mouth daily.         Marland Kitchen HYDROcodone-acetaminophen (NORCO/VICODIN) 5-325 MG per tablet   Oral   Take 1 tablet by mouth every 6 (six) hours as needed for pain.         Marland Kitchen ibuprofen (ADVIL,MOTRIN) 200 MG tablet   Oral   Take 400 mg by mouth every 8 (eight) hours as needed. pain         . LORazepam (ATIVAN) 1 MG tablet   Oral  Take 1 mg by mouth every 8 (eight) hours.         . promethazine (PHENERGAN) 25 MG tablet   Oral   Take 25 mg by mouth daily as needed. Stomach pain and nausea         . psyllium (METAMUCIL) 58.6 % powder   Oral   Take 1 packet by mouth 2 (two) times daily.         . rizatriptan (MAXALT-MLT) 10 MG disintegrating tablet   Oral   Take 10 mg by mouth as needed. May repeat in 2 hours if needed, maximum of 3 tablets in a 24 hour period         . Simethicone (GAS-X PO)   Oral   Take 1 tablet by mouth daily as needed (gas).          Triage Vitals: BP 115/70  Pulse 66  Temp(Src) 98.2 F (36.8 C) (Oral)  Resp 18  Ht 5' 8.5" (1.74 m)  Wt 172 lb (78.019 kg)  BMI 25.77 kg/m2  SpO2 95%  Physical Exam  Nursing note and vitals reviewed.  CONSTITUTIONAL: Well developed/well nourished HEAD:  Normocephalic/atraumatic, no signs of trauma noted EYES: EOMI/PERRL ENMT: Mucous membranes moist NECK: supple no meningeal signs SPINE:entire spine nontender, diffuse paraspinal tenderness noted, no bruising noted CV: S1/S2 noted, no murmurs/rubs/gallops noted LUNGS: Lungs are clear to auscultation bilaterally, no apparent distress ABDOMEN: soft, nontender, no rebound or guarding GU:no cva tenderness NEURO: Pt is awake/alert, moves all extremitiesx4, no focal weakness noted  EXTREMITIES: pulses normal, full ROM SKIN: warm, color normal PSYCH: pt appears confused, thinks that the year is 2006 and that [employer] is president    ED Course  Procedures   DIAGNOSTIC STUDIES: Oxygen Saturation is 95% on room air, normal by my interpretation.    COORDINATION OF CARE: 1:37 PM-Discussed treatment plan which includes CT of head, CBC panel, CMP and UA with pt at bedside and pt agreed to plan.  2:47 PM Son and daughter (both adults) reports patient just lost her mother yesterday and has been acting like this since.  She has h/o similar episode in the past and has extensive psych history.    4:02 PM Medically stable Awaiting psych evaluation   Labs Review Labs Reviewed  GLUCOSE, CAPILLARY - Abnormal; Notable for the following:    Glucose-Capillary 117 (*)    All other components within normal limits  COMPREHENSIVE METABOLIC PANEL  CBC WITH DIFFERENTIAL  URINALYSIS, ROUTINE W REFLEX MICROSCOPIC  ACETAMINOPHEN LEVEL  AMMONIA  CK  ETHANOL  URINE RAPID DRUG SCREEN (HOSP PERFORMED)  SALICYLATE LEVEL   Imaging Review No results found.  EKG Interpretation     Ventricular Rate:  48 PR Interval:  154 QRS Duration: 90 QT Interval:  442 QTC Calculation: 394 R Axis:   73 Text Interpretation:  Sinus bradycardia Otherwise normal ECG            MDM  No diagnosis found. Nursing notes including past medical history and social history reviewed and considered in  documentation Labs/vital reviewed and considered   I personally performed the services described in this documentation, which was scribed in my presence. The recorded information has been reviewed and is accurate.       Vicki Gaskins, MD 11/13/12 [phone]
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
## 2012-11-13 NOTE — ED Notes (Signed)
Contact number Asmi Fugere- pt's daughter [phone]

## 2012-11-13 NOTE — ED Notes (Signed)
Explained to pt concern for her confusion and doctor wants CT done, pt agreeing now to go to CT

## 2012-11-13 NOTE — ED Notes (Signed)
Pt refused nicotine patch.

## 2012-11-13 NOTE — ED Notes (Signed)
Pt has appt with Dr. Reino Kent on Wed. Pt also has appt next week with follow-up psychiatric service.Consult completed. Pt recommended for discharge.

## 2012-11-13 NOTE — ED Notes (Addendum)
Pt into paper scrubs, pt's pocket book and belongings in it gone through in pt's sight

## 2012-11-13 NOTE — ED Provider Notes (Signed)
Pt now back to baseline, recognizes family, knows current date/president No SI Appears stable for d/c home Has psych f/u this week   Joya Gaskins, MD 11/13/12 1851

## 2013-03-19 IMAGING — CR DG CHEST 2V
1 series · 2 of 2 positions shown · non-contrast
Comparison: none

REASON FOR EXAM: ECT workup
COMMENTS:

[Series 1: w chest pa · 0.14mm/px · 2 of 2 slices shown]
[im 1/2]
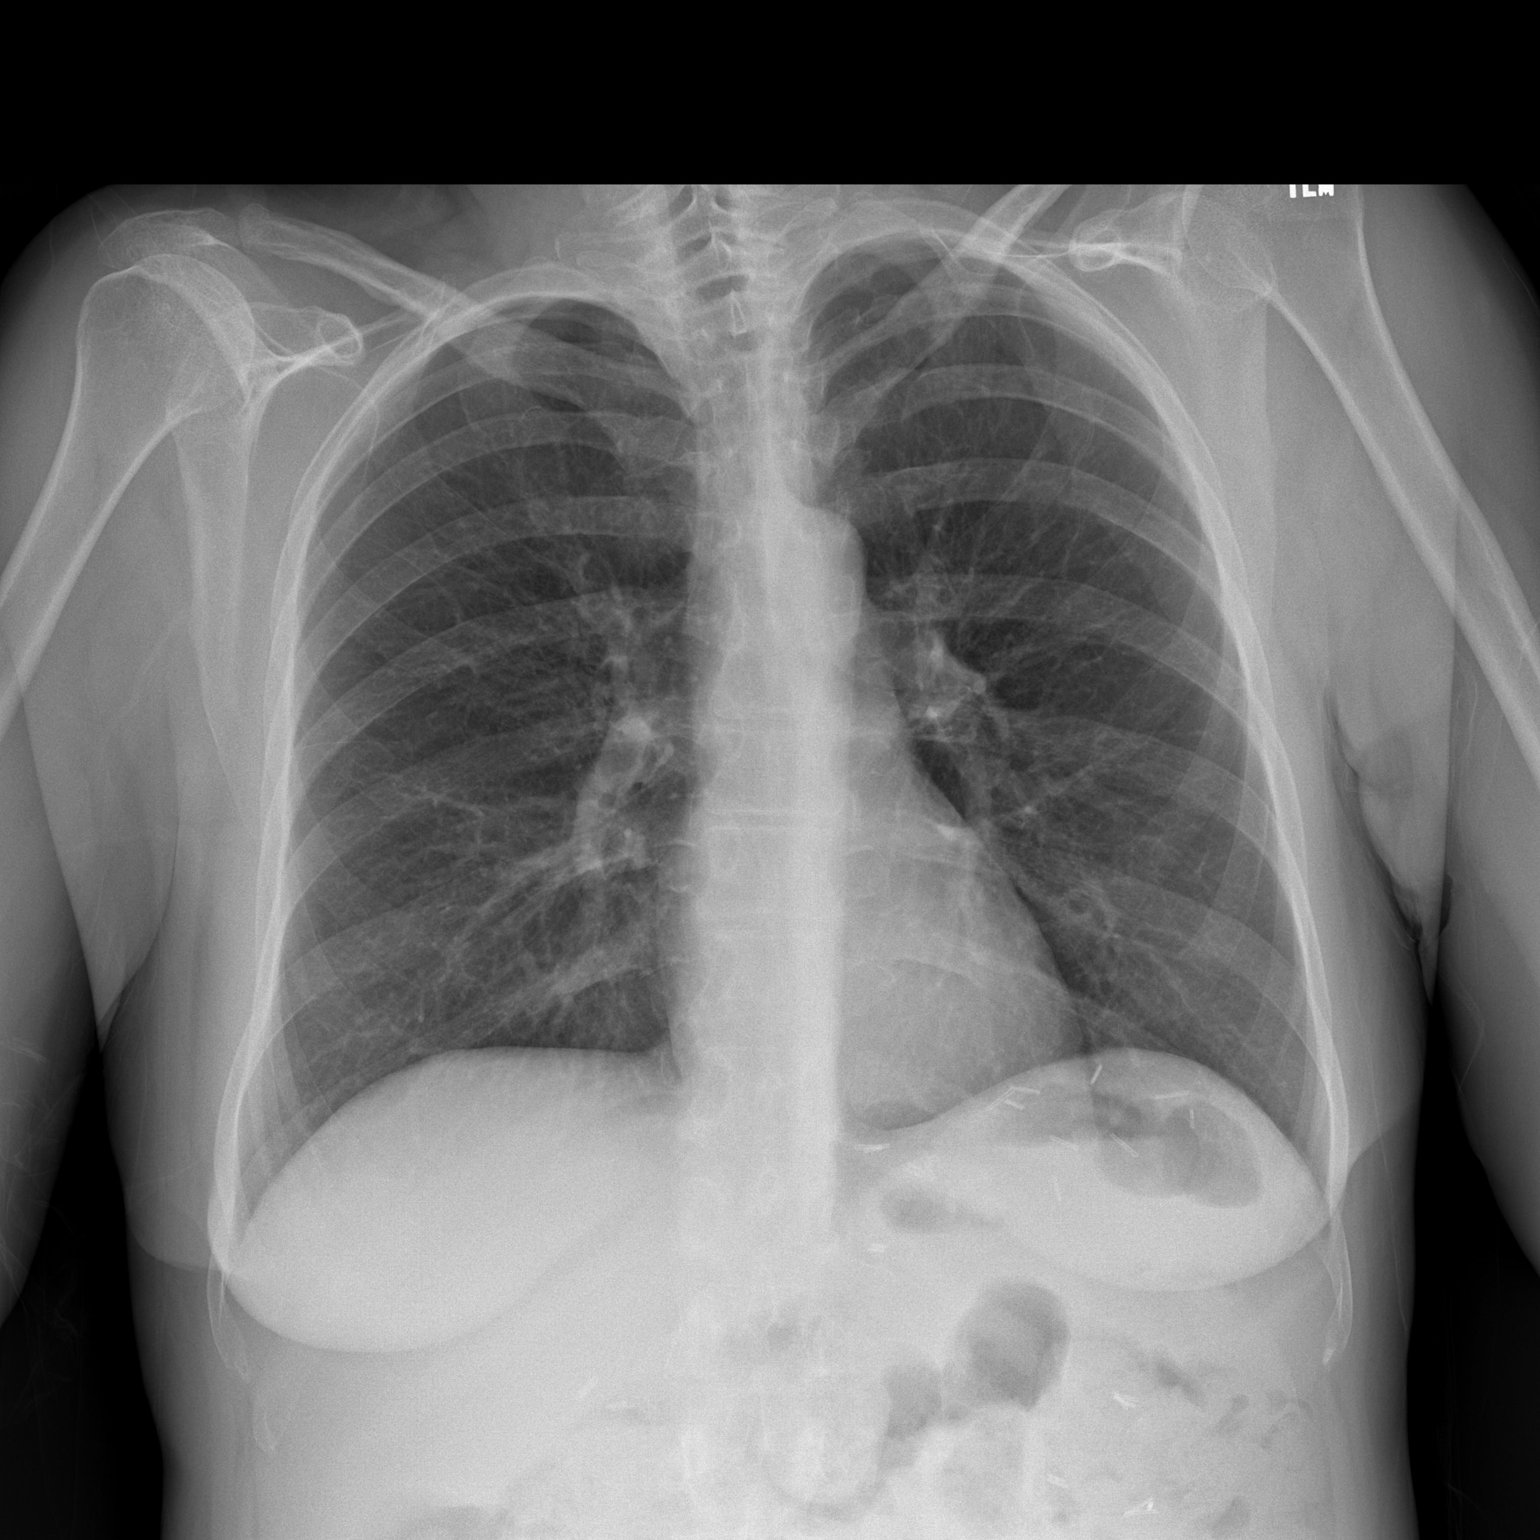
[im 2/2]
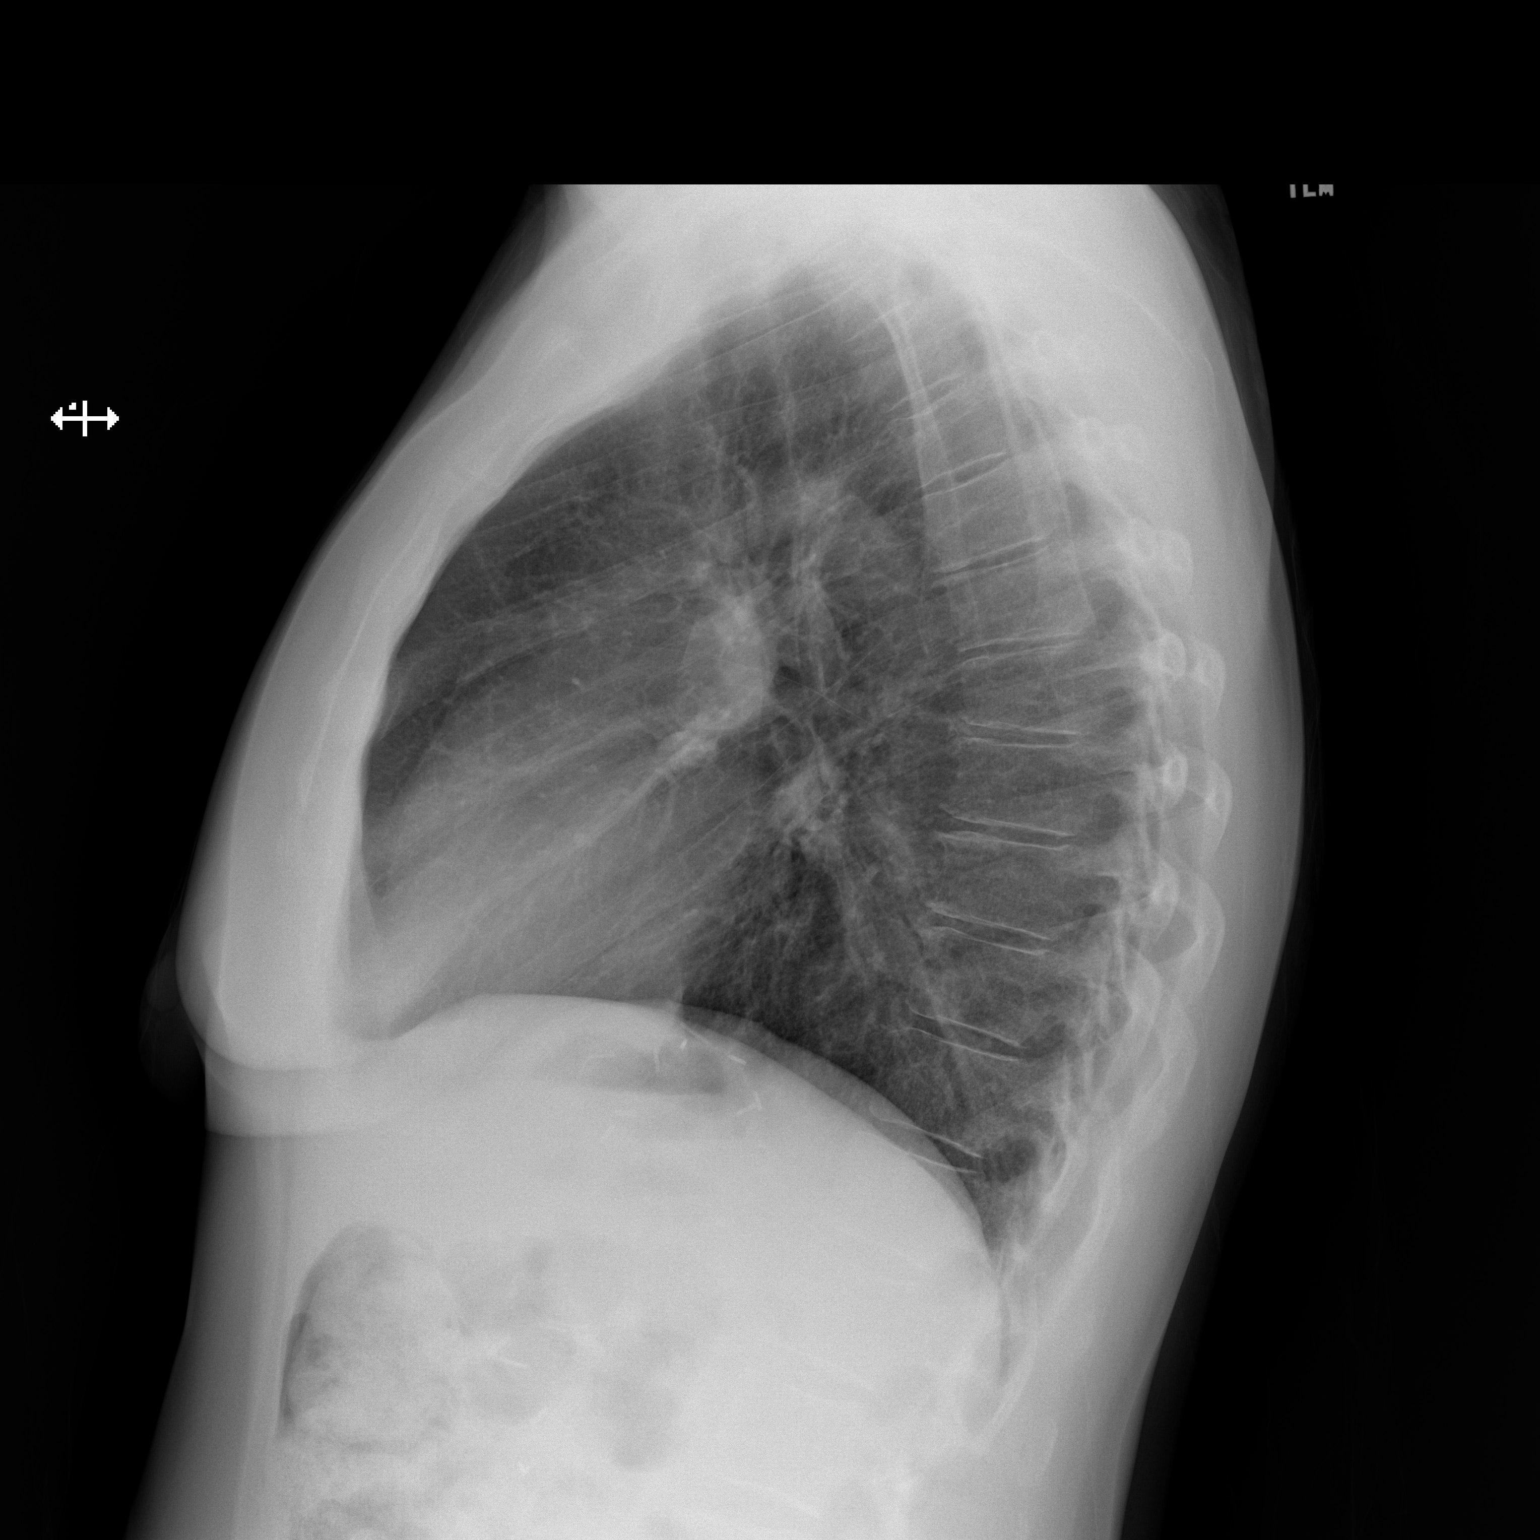

[2 of 2 positions shown; findings below may reference images not displayed]

PROCEDURE:     DXR - DXR CHEST PA (OR AP) AND LATERAL  - April 16, 2011 [DATE]

RESULT:     The lungs are well-expanded. There is no focal infiltrate. The
cardiac silhouette is normal in size. The pulmonary vascularity is not
engorged. The perihilar lung markings are mildly prominent especially
inferiorly, bilaterally. I see no pleural effusion. The mediastinum is
normal in width. There are surgical clips in the left upper quadrant of the
abdomen.
IMPRESSION: I do not see evidence of CHF nor of pneumonia. I cannot
exclude acute bronchitis in the appropriate clinical setting.

## 2013-03-22 IMAGING — US ABDOMEN ULTRASOUND
1 series · 17 of 25 positions shown · non-contrast
Comparison: none

REASON FOR EXAM: abnormal LFTs
COMMENTS:

[Series 1: abdomen ultrasound · 17 of 44 slices shown]
[im 1/44]
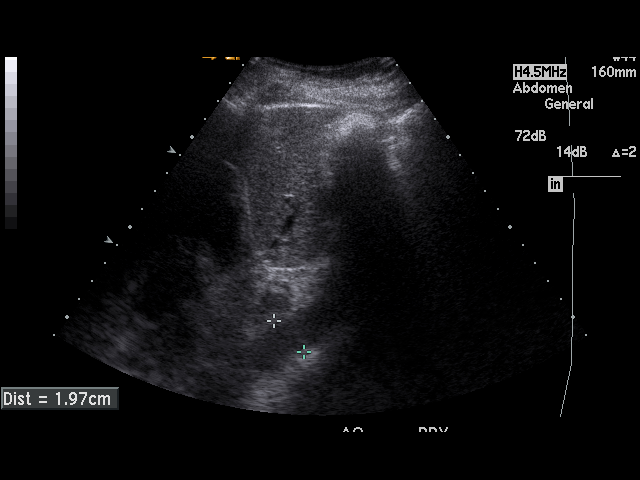
[im 4/44]
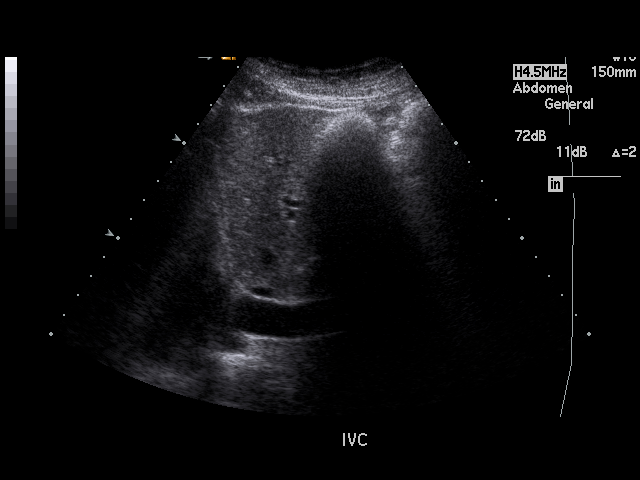
[im 6/44]
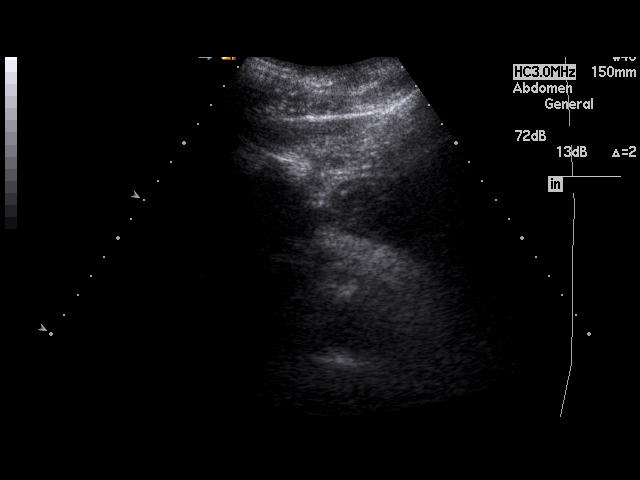
[im 9/44]
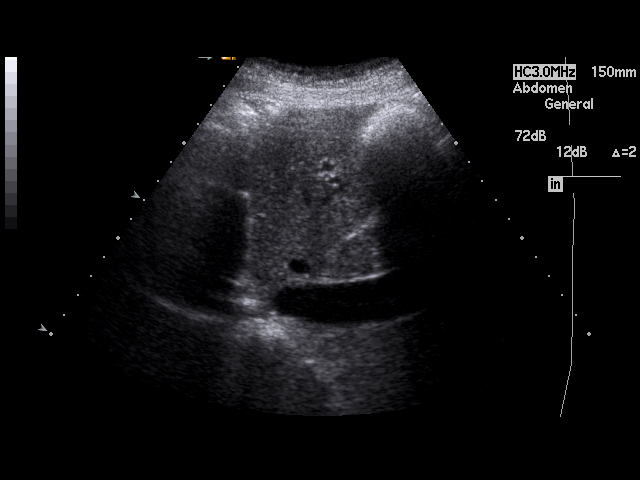
[im 11/44]
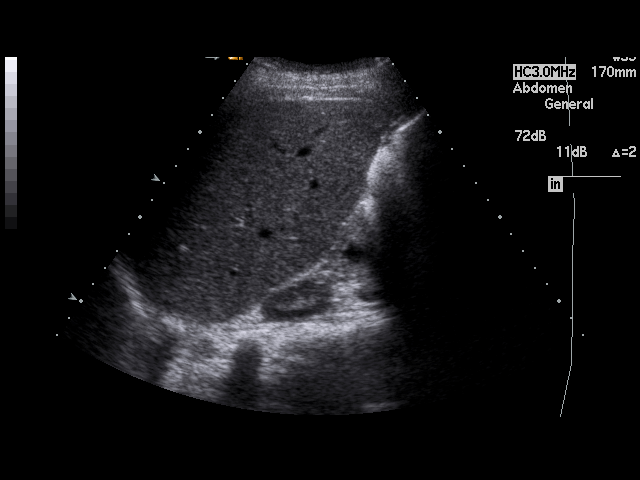
[im 15/44]
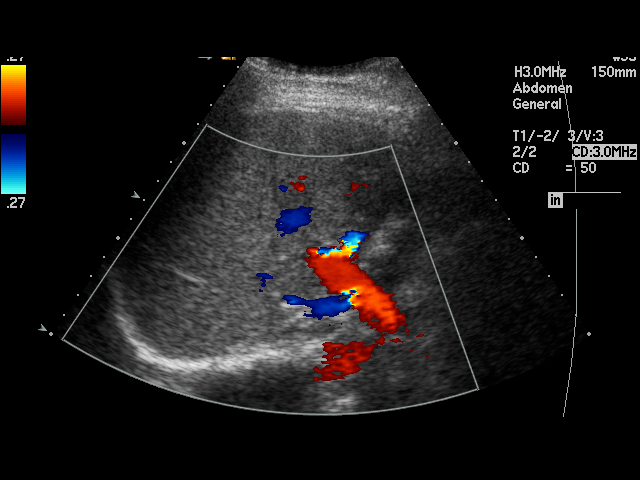
[im 17/44]
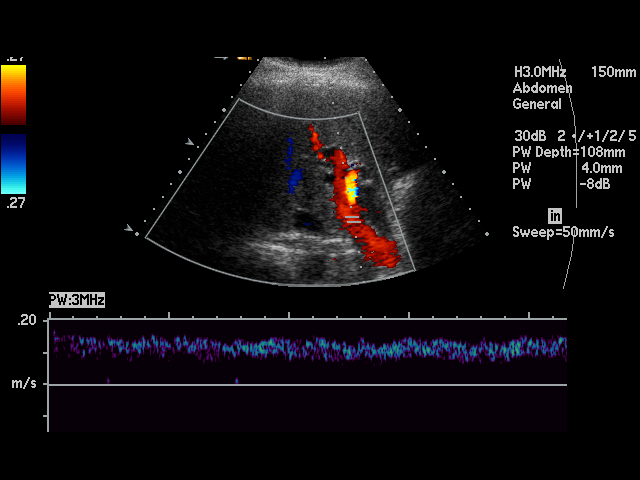
[im 20/44]
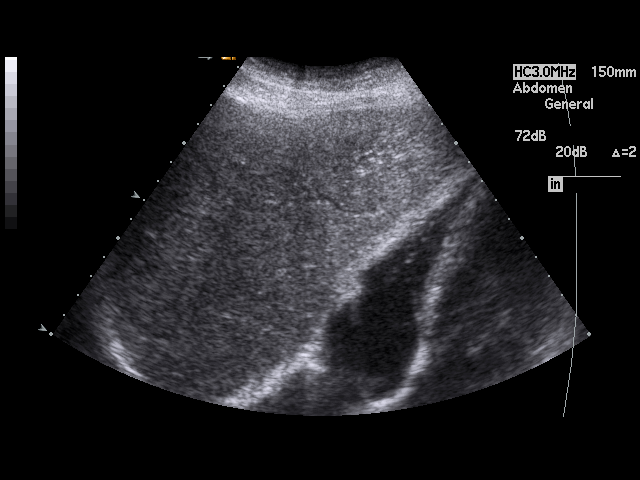
[im 22/44]
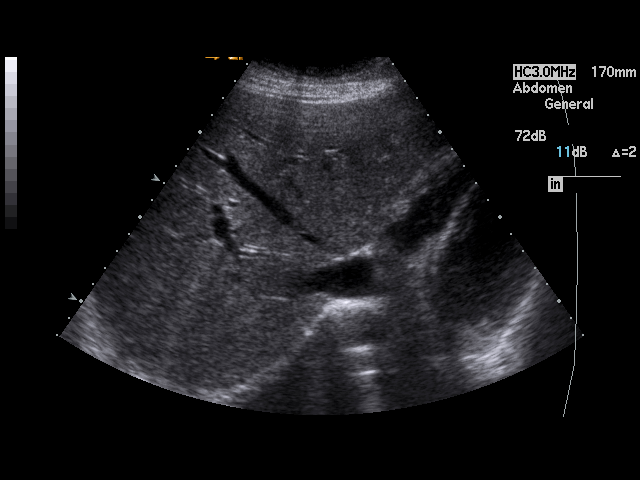
[im 24/44]
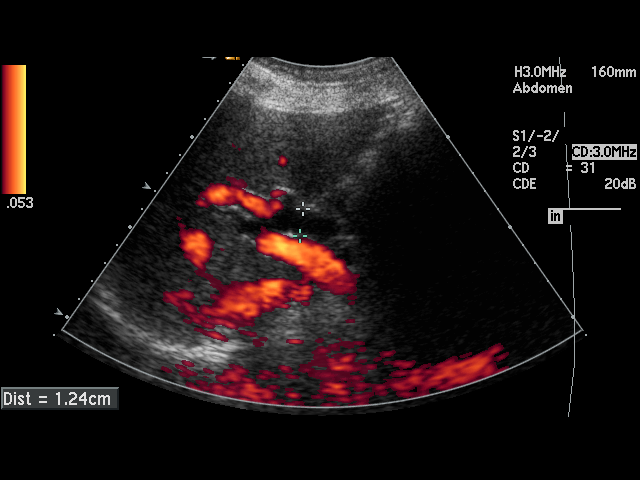
[im 27/44]
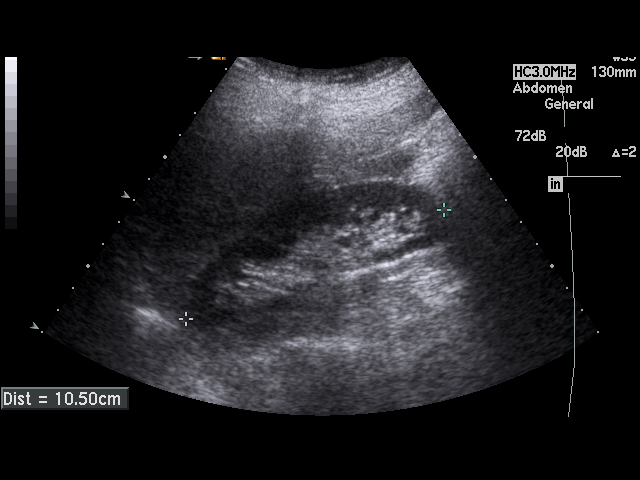
[im 29/44]
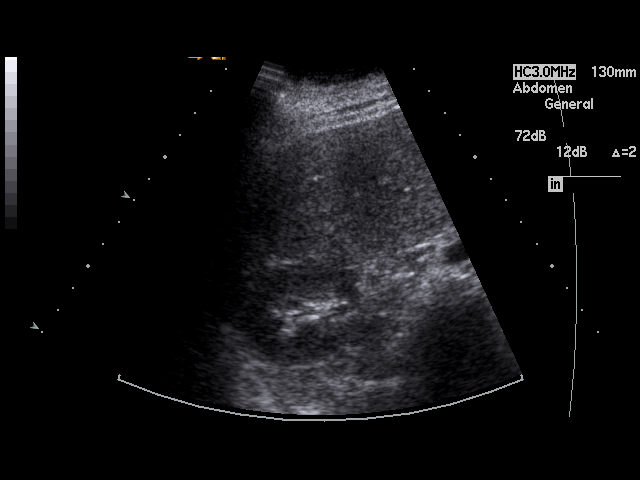
[im 33/44]
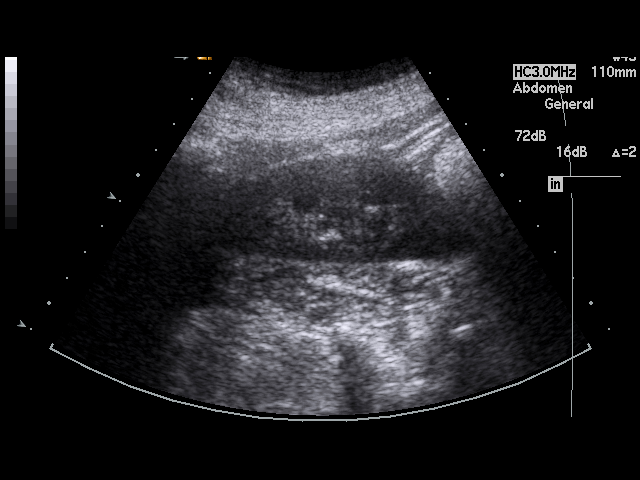
[im 35/44]
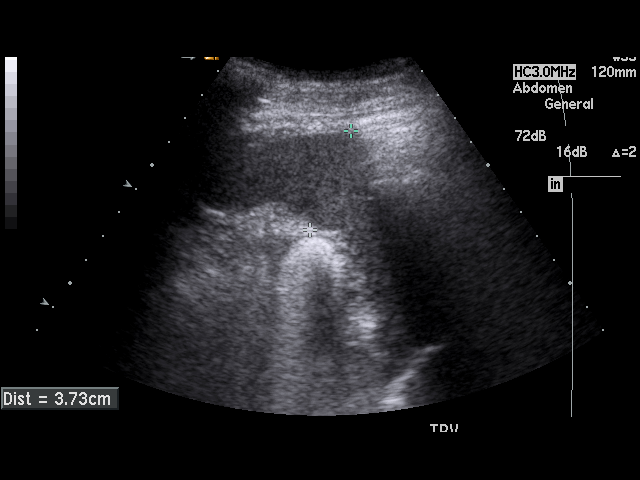
[im 38/44]
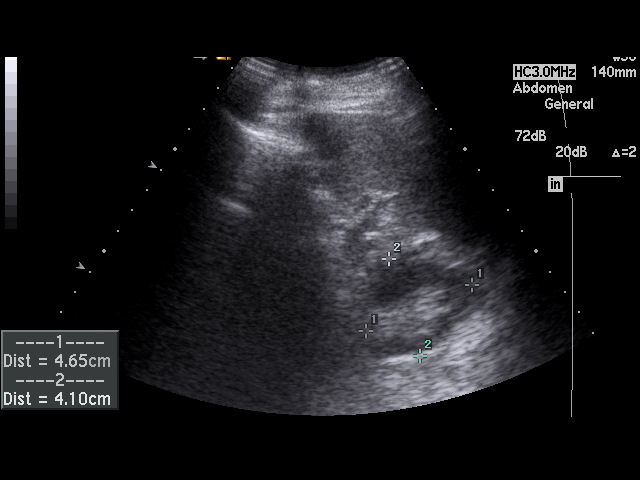
[im 40/44]
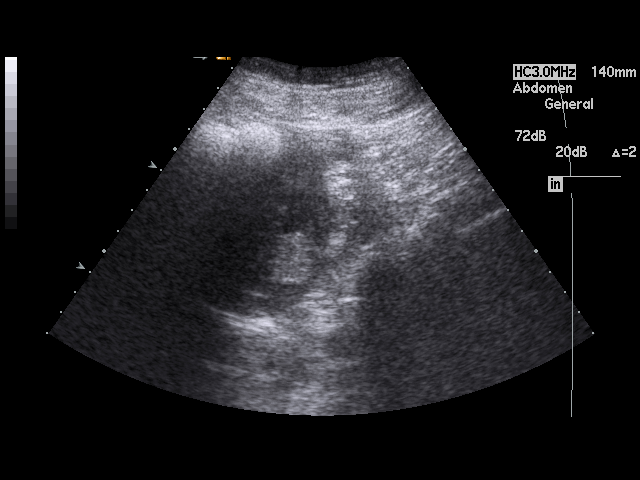
[im 44/44]
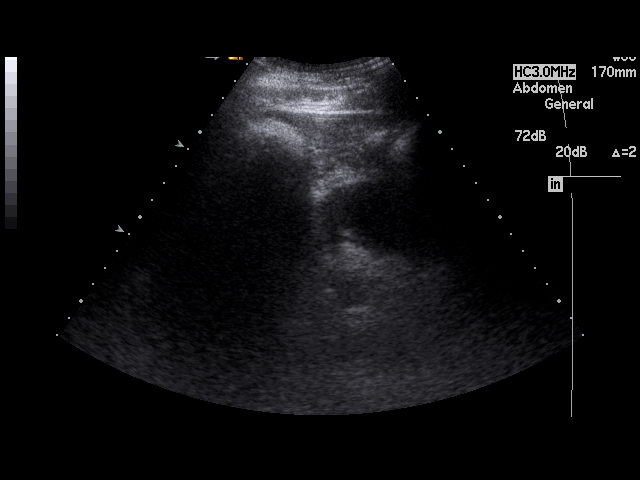

[17 of 25 positions shown; findings below may reference images not displayed]

PROCEDURE:     US  - US ABDOMEN GENERAL SURVEY  - April 19, 2011  [DATE]

RESULT:     The liver, spleen, abdominal aorta and inferior vena cava show
no significant abnormalities. The pancreas is not visualized adequate for
evaluation on this exam. The gallbladder is not seen compatible with prior
cholecystectomy. The common bile duct measures 13 mm in diameter which is
prominent but which can be within normal limits for the post cholecystectomy
patient. Note is made that the common duct measured 11 mm on a prior CT of
4322. The kidneys show no hydronephrosis. Sagittally, the right kidney
measures 10.5 cm and the left measures 10.1 cm.
IMPRESSION: 1. The patient is status post cholecystectomy.
2. There is dilatation of the common duct which can be a normal finding in
the post cholecystectomy patient.
3. The pancreas is not visualized adequate for evaluation on this exam.

## 2013-09-05 ENCOUNTER — Encounter (HOSPITAL_COMMUNITY): Payer: Self-pay | Admitting: Psychiatry

## 2013-09-05 ENCOUNTER — Ambulatory Visit (INDEPENDENT_AMBULATORY_CARE_PROVIDER_SITE_OTHER): Payer: 59 | Admitting: Psychiatry

## 2013-09-05 VITALS — BP 108/62 | HR 66 | Ht 68.5 in | Wt 166.0 lb

## 2013-09-05 DIAGNOSIS — F3160 Bipolar disorder, current episode mixed, unspecified: Secondary | ICD-10-CM

## 2013-09-05 DIAGNOSIS — F4310 Post-traumatic stress disorder, unspecified: Secondary | ICD-10-CM

## 2013-09-05 MED ORDER — DIAZEPAM 10 MG PO TABS: 10.0000 mg | ORAL_TABLET | Freq: Three times a day (TID) | ORAL | Status: DC

## 2013-09-05 MED ORDER — OXCARBAZEPINE 150 MG PO TABS: 150.0000 mg | ORAL_TABLET | Freq: Two times a day (BID) | ORAL | Status: DC

## 2013-09-05 MED ORDER — ZOLPIDEM TARTRATE 10 MG PO TABS: 10.0000 mg | ORAL_TABLET | Freq: Every day | ORAL | Status: DC

## 2013-09-05 MED ORDER — ESCITALOPRAM OXALATE 20 MG PO TABS: 20.0000 mg | ORAL_TABLET | Freq: Every day | ORAL | Status: DC

## 2013-09-05 MED ORDER — DULOXETINE HCL 60 MG PO CPEP: 60.0000 mg | ORAL_CAPSULE | Freq: Two times a day (BID) | ORAL | Status: DC
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
## 2013-09-14 ENCOUNTER — Other Ambulatory Visit (HOSPITAL_COMMUNITY): Payer: Self-pay | Admitting: Psychiatry

## 2013-09-14 ENCOUNTER — Telehealth (HOSPITAL_COMMUNITY): Payer: Self-pay | Admitting: *Deleted

## 2013-09-14 MED ORDER — OXCARBAZEPINE 150 MG PO TABS: ORAL_TABLET | ORAL | Status: DC

## 2013-09-14 NOTE — Telephone Encounter (Signed)
D/c lexapro, increase trileptal to 150 qam, 300 mg qhs

## 2013-09-14 NOTE — Telephone Encounter (Signed)
returning pt calling due to her c/o retaining fluid and she thinks it's due her Lexipro. Pt states her skin is very sensitive and would like to talk to Dr. Tenny Crawoss about her Lexipro..[phone]

## 2013-09-26 ENCOUNTER — Ambulatory Visit (INDEPENDENT_AMBULATORY_CARE_PROVIDER_SITE_OTHER): Payer: 59 | Admitting: Psychiatry

## 2013-09-26 ENCOUNTER — Encounter (HOSPITAL_COMMUNITY): Payer: Self-pay | Admitting: Psychiatry

## 2013-09-26 DIAGNOSIS — F4310 Post-traumatic stress disorder, unspecified: Secondary | ICD-10-CM

## 2013-09-26 NOTE — Patient Instructions (Signed)
Discussed orallly 
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
## 2013-10-03 ENCOUNTER — Ambulatory Visit (HOSPITAL_COMMUNITY): Payer: Self-pay | Admitting: Psychiatry

## 2013-10-03 ENCOUNTER — Encounter (HOSPITAL_COMMUNITY): Payer: Self-pay | Admitting: Psychiatry

## 2013-10-16 ENCOUNTER — Ambulatory Visit (INDEPENDENT_AMBULATORY_CARE_PROVIDER_SITE_OTHER): Payer: 59 | Admitting: Psychiatry

## 2013-10-16 DIAGNOSIS — F4310 Post-traumatic stress disorder, unspecified: Secondary | ICD-10-CM
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
## 2013-10-16 NOTE — Patient Instructions (Signed)
Discussed orally 

## 2013-10-23 ENCOUNTER — Encounter (HOSPITAL_COMMUNITY): Payer: Self-pay | Admitting: Psychiatry

## 2013-10-23 ENCOUNTER — Ambulatory Visit (INDEPENDENT_AMBULATORY_CARE_PROVIDER_SITE_OTHER): Payer: 59 | Admitting: Psychiatry

## 2013-10-23 VITALS — BP 124/72 | HR 78 | Ht 68.5 in | Wt 160.4 lb

## 2013-10-23 DIAGNOSIS — F3160 Bipolar disorder, current episode mixed, unspecified: Secondary | ICD-10-CM

## 2013-10-23 DIAGNOSIS — F4310 Post-traumatic stress disorder, unspecified: Secondary | ICD-10-CM

## 2013-10-23 MED ORDER — DULOXETINE HCL 60 MG PO CPEP: 60.0000 mg | ORAL_CAPSULE | Freq: Two times a day (BID) | ORAL | Status: DC

## 2013-10-23 MED ORDER — DIAZEPAM 10 MG PO TABS: 10.0000 mg | ORAL_TABLET | Freq: Three times a day (TID) | ORAL | Status: DC

## 2013-10-23 MED ORDER — OXCARBAZEPINE 150 MG PO TABS: ORAL_TABLET | ORAL | Status: DC

## 2013-10-23 MED ORDER — SUVOREXANT 15 MG PO TABS: 15.0000 mg | ORAL_TABLET | Freq: Every day | ORAL | Status: DC
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
## 2013-11-02 ENCOUNTER — Ambulatory Visit (HOSPITAL_COMMUNITY): Payer: Self-pay | Admitting: Psychiatry

## 2013-11-02 ENCOUNTER — Telehealth (HOSPITAL_COMMUNITY): Payer: Self-pay | Admitting: *Deleted

## 2013-11-02 NOTE — Telephone Encounter (Signed)
Pt states she would like to talk to Dr. Tenny Crawoss due to her mood swings and don't know what to do. Per pt she is not suicidal .[phone].

## 2013-11-02 NOTE — Telephone Encounter (Signed)
Told pt to take valium on a scheduled basis 10 mg tid

## 2013-11-19 ENCOUNTER — Ambulatory Visit (HOSPITAL_COMMUNITY): Payer: Self-pay | Admitting: Psychiatry

## 2013-11-22 ENCOUNTER — Ambulatory Visit (HOSPITAL_COMMUNITY): Payer: Self-pay | Admitting: Psychiatry

## 2013-11-27 ENCOUNTER — Encounter (HOSPITAL_COMMUNITY): Payer: Self-pay

## 2013-11-27 ENCOUNTER — Emergency Department (HOSPITAL_COMMUNITY)
Admission: EM | Admit: 2013-11-27 | Discharge: 2013-11-27 | Discharge status: Against Medical Advice | Payer: PRIVATE HEALTH INSURANCE | Attending: Emergency Medicine | Admitting: Emergency Medicine

## 2013-11-27 DIAGNOSIS — J45909 Unspecified asthma, uncomplicated: Secondary | ICD-10-CM | POA: Diagnosis not present

## 2013-11-27 DIAGNOSIS — G4700 Insomnia, unspecified: Secondary | ICD-10-CM | POA: Insufficient documentation

## 2013-11-27 DIAGNOSIS — G8929 Other chronic pain: Secondary | ICD-10-CM | POA: Insufficient documentation

## 2013-11-27 NOTE — ED Notes (Signed)
Pt has depression and has been having insomnia for a while now. Denies any SI/HI. Has called her psychiatrist. Pt dozed a little today in the car while driving.

## 2013-11-29 ENCOUNTER — Telehealth (HOSPITAL_COMMUNITY): Payer: Self-pay | Admitting: Psychiatry

## 2013-11-29 ENCOUNTER — Encounter (HOSPITAL_COMMUNITY): Payer: Self-pay | Admitting: Psychiatry

## 2013-11-29 ENCOUNTER — Ambulatory Visit (INDEPENDENT_AMBULATORY_CARE_PROVIDER_SITE_OTHER): Payer: 59 | Admitting: Psychiatry

## 2013-11-29 VITALS — BP 109/62 | HR 78 | Ht 68.0 in | Wt 164.0 lb

## 2013-11-29 DIAGNOSIS — F3160 Bipolar disorder, current episode mixed, unspecified: Secondary | ICD-10-CM

## 2013-11-29 DIAGNOSIS — F4310 Post-traumatic stress disorder, unspecified: Secondary | ICD-10-CM

## 2013-11-29 MED ORDER — OXCARBAZEPINE 150 MG PO TABS: ORAL_TABLET | ORAL | Status: DC

## 2013-11-29 MED ORDER — DULOXETINE HCL 60 MG PO CPEP: 60.0000 mg | ORAL_CAPSULE | Freq: Two times a day (BID) | ORAL | Status: DC

## 2013-11-29 MED ORDER — DIAZEPAM 10 MG PO TABS: 10.0000 mg | ORAL_TABLET | Freq: Three times a day (TID) | ORAL | Status: DC

## 2013-11-29 MED ORDER — SUVOREXANT 20 MG PO TABS: 20.0000 mg | ORAL_TABLET | Freq: Every day | ORAL | Status: DC
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
## 2013-12-04 ENCOUNTER — Telehealth (HOSPITAL_COMMUNITY): Payer: Self-pay | Admitting: *Deleted

## 2013-12-04 NOTE — Telephone Encounter (Signed)
Pt calling crying on the phone stating she have to talk to Dr. Tenny Crawoss about the medication Vicki Thomas(Latuda) she was started on her last visit. Pt states she is not suicidal and she can do everything but she really need to talk to Dr. Tenny Crawoss. Pt number is  [phone].

## 2013-12-04 NOTE — Telephone Encounter (Signed)
Pt states Latuda made her feel sick, she can't tolerate numerous medications. Denies being suicidal. She has missed several appointments with therapist. i told her she must return to therapy or I can't help her. Ruby will call to make appointment

## 2013-12-05 NOTE — Telephone Encounter (Signed)
Pt made appt with Peggy for Dec 9th and shows understanding that she have to come to appt.

## 2013-12-07 ENCOUNTER — Telehealth (HOSPITAL_COMMUNITY): Payer: Self-pay | Admitting: *Deleted

## 2013-12-07 NOTE — Telephone Encounter (Signed)
noted 

## 2013-12-07 NOTE — Telephone Encounter (Signed)
patient said she just wanted you to know this.   when she took the JordanLatuda, she took it with all her other medications.  Her head was splitting, then sound like clapping in her head.  Last two days she has had no depression.

## 2013-12-13 ENCOUNTER — Telehealth (HOSPITAL_COMMUNITY): Payer: Self-pay | Admitting: *Deleted

## 2013-12-13 ENCOUNTER — Ambulatory Visit (INDEPENDENT_AMBULATORY_CARE_PROVIDER_SITE_OTHER): Payer: 59 | Admitting: Psychiatry

## 2013-12-13 ENCOUNTER — Ambulatory Visit (HOSPITAL_COMMUNITY): Payer: Self-pay | Admitting: Psychiatry

## 2013-12-13 ENCOUNTER — Encounter (HOSPITAL_COMMUNITY): Payer: Self-pay | Admitting: Psychiatry

## 2013-12-13 VITALS — BP 134/67 | HR 95 | Ht 68.0 in | Wt 164.2 lb

## 2013-12-13 DIAGNOSIS — F3160 Bipolar disorder, current episode mixed, unspecified: Secondary | ICD-10-CM

## 2013-12-13 DIAGNOSIS — F4310 Post-traumatic stress disorder, unspecified: Secondary | ICD-10-CM

## 2013-12-13 MED ORDER — ALPRAZOLAM 1 MG PO TABS: 1.0000 mg | ORAL_TABLET | Freq: Three times a day (TID) | ORAL | Status: DC

## 2013-12-13 MED ORDER — BREXPIPRAZOLE 2 MG PO TABS: 2.0000 mg | ORAL_TABLET | Freq: Every day | ORAL | Status: DC
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
## 2013-12-13 NOTE — Telephone Encounter (Signed)
Left message

## 2013-12-13 NOTE — Telephone Encounter (Signed)
after taking Latuda she was feeling okay, yesterday it came back like a brush fire, the anguish and depression.   She said the Latudahas not passed FDA

## 2013-12-13 NOTE — Telephone Encounter (Signed)
phone call from patient, she is taking Cymbalta and Trileptal.  Her question is do she take the Cymbalta and Trileptal along with the Rexulti?

## 2013-12-13 NOTE — Telephone Encounter (Signed)
Yes, please ask Vicki Thomas to call her

## 2013-12-13 NOTE — Patient Instructions (Signed)
Present to behavioral health outpatient clinic in  on dec 2 at 8:45 am for intensive outpatient program

## 2013-12-13 NOTE — Telephone Encounter (Signed)
Patient will be seen today!!

## 2013-12-14 NOTE — Telephone Encounter (Signed)
Pt called back, pt is aware of Dr. Tenny Crawoss advise and shows understanding

## 2013-12-14 NOTE — Telephone Encounter (Signed)
Opened in Error.

## 2013-12-14 NOTE — Telephone Encounter (Signed)
Lmtcb, number provided 

## 2013-12-19 ENCOUNTER — Encounter (HOSPITAL_COMMUNITY): Payer: Self-pay | Admitting: Cardiology

## 2013-12-19 ENCOUNTER — Emergency Department (HOSPITAL_COMMUNITY): Payer: Medicaid Other

## 2013-12-19 ENCOUNTER — Emergency Department (HOSPITAL_COMMUNITY)
Admission: EM | Admit: 2013-12-19 | Discharge: 2013-12-19 | Disposition: A | Discharge status: Home / Self Care | Payer: Medicaid Other | Attending: Emergency Medicine | Admitting: Emergency Medicine

## 2013-12-19 DIAGNOSIS — Z79899 Other long term (current) drug therapy: Secondary | ICD-10-CM | POA: Insufficient documentation

## 2013-12-19 DIAGNOSIS — J45901 Unspecified asthma with (acute) exacerbation: Secondary | ICD-10-CM | POA: Insufficient documentation

## 2013-12-19 DIAGNOSIS — R059 Cough, unspecified: Secondary | ICD-10-CM

## 2013-12-19 DIAGNOSIS — R51 Headache: Secondary | ICD-10-CM | POA: Diagnosis not present

## 2013-12-19 DIAGNOSIS — Z8719 Personal history of other diseases of the digestive system: Secondary | ICD-10-CM | POA: Insufficient documentation

## 2013-12-19 DIAGNOSIS — M1990 Unspecified osteoarthritis, unspecified site: Secondary | ICD-10-CM | POA: Insufficient documentation

## 2013-12-19 DIAGNOSIS — Z880 Allergy status to penicillin: Secondary | ICD-10-CM | POA: Insufficient documentation

## 2013-12-19 DIAGNOSIS — F319 Bipolar disorder, unspecified: Secondary | ICD-10-CM | POA: Insufficient documentation

## 2013-12-19 DIAGNOSIS — R05 Cough: Secondary | ICD-10-CM

## 2013-12-19 DIAGNOSIS — Z8679 Personal history of other diseases of the circulatory system: Secondary | ICD-10-CM | POA: Diagnosis not present

## 2013-12-19 DIAGNOSIS — Z720 Tobacco use: Secondary | ICD-10-CM | POA: Insufficient documentation

## 2013-12-19 DIAGNOSIS — G8929 Other chronic pain: Secondary | ICD-10-CM | POA: Diagnosis not present

## 2013-12-19 DIAGNOSIS — Z87448 Personal history of other diseases of urinary system: Secondary | ICD-10-CM | POA: Insufficient documentation

## 2013-12-19 DIAGNOSIS — J40 Bronchitis, not specified as acute or chronic: Secondary | ICD-10-CM

## 2013-12-19 MED ORDER — PREDNISONE 50 MG PO TABS
60.0000 mg | ORAL_TABLET | Freq: Once | ORAL | Status: AC
  Administered 2013-12-19: 60 mg via ORAL
  Filled 2013-12-19 (×2): qty 1

## 2013-12-19 MED ORDER — ALBUTEROL SULFATE HFA 108 (90 BASE) MCG/ACT IN AERS
2.0000 | INHALATION_SPRAY | Freq: Once | RESPIRATORY_TRACT | Status: AC
  Administered 2013-12-19: 2 via RESPIRATORY_TRACT
  Filled 2013-12-19: qty 6.7

## 2013-12-19 MED ORDER — PREDNISONE 20 MG PO TABS: 40.0000 mg | ORAL_TABLET | Freq: Every day | ORAL | Status: DC

## 2013-12-19 NOTE — ED Notes (Signed)
Pt awaiting RT for discharge. 

## 2013-12-19 NOTE — ED Notes (Signed)
Cough times 4 days.

## 2013-12-19 NOTE — ED Provider Notes (Signed)
CSN: [id number]     Arrival date & time 12/19/13  1220 History  This chart was scribed for Vicki Thomas Zyaira Vejar, MD by Littie Deedsichard Sun, ED Scribe. This patient was seen in room APFT23/APFT23 and the patient's care was started at 1:00 PM.     Chief Complaint  Patient presents with  . Cough    Patient is a 53 y.o. female presenting with cough. The history is provided by the patient. No language interpreter was used.  Cough Cough characteristics:  Productive Sputum characteristics:  Green and yellow Onset quality:  Gradual Timing:  Intermittent Chronicity:  New Smoker: yes   Associated symptoms: fever, headaches and wheezing   Associated symptoms: no shortness of breath    HPI Comments: Vicki Thomas is a 53 y.o. female who presents to the Emergency Department complaining of gradual onset productive cough with green/yellow phlegm that began 4 days ago. Patient also reports wheezing, fever, generalized weakness, fatigue, difficulty sleeping due to cough, and HA exacerbated by cough. She denies SOB. Patient currently smokes, but she has intentions to quit.   Past Medical History  Diagnosis Date  . Interstitial cystitis   . Fibromyalgia   . Chronic fatigue   . Asthma   . Arthritis   . Bipolar 1 disorder   . PTSD (post-traumatic stress disorder)   . Lymphedema   . Panic disorder with agoraphobia   . Chronic pain   . Hiatal hernia   . Chronic headaches   . Chronic back pain    Past Surgical History  Procedure Laterality Date  . Cholecystectomy    . Abdominal hysterectomy    . Tonsillectomy    . Foot surgery    . Bariatric surgery    . Hemorrhoid surgery    . Hernia repair    . Esophagus surgery    . Appendectomy     Family History  Problem Relation Age of Onset  . Alcohol abuse Mother   . Bipolar disorder Father   . Paranoid behavior Father   . Alcohol abuse Father   . Bipolar disorder Sister   . Anxiety disorder Sister   . Alcohol abuse Brother   . Drug abuse Brother   .  Bipolar disorder Paternal Aunt   . Schizophrenia Maternal Grandfather   . Anxiety disorder Maternal Aunt    History  Substance Use Topics  . Smoking status: Current Every Day Smoker -- 1.00 packs/day for 6 years  . Smokeless tobacco: Never Used  . Alcohol Use: No   OB History    No data available     Review of Systems  Constitutional: Positive for fever and fatigue.  Respiratory: Positive for cough and wheezing. Negative for shortness of breath.   Neurological: Positive for weakness and headaches.  Psychiatric/Behavioral: Positive for sleep disturbance.  All other systems reviewed and are negative.     Allergies  Bee venom; Erythromycin; Haldol; Penicillins; Abilify; Aripiprazole; Ciprocin-fluocin-procin; Fexofenadine hcl; Gabapentin; Iodine; Latuda; Lithium; Marcaine; Paroxetine hcl; Pregabalin; Prozac; Remeron; Seroquel; Tape; Zofran; and Zonisamide  Home Medications   Prior to Admission medications   Medication Sig Start Date End Date Taking? Authorizing Provider  acetaminophen (TYLENOL) 500 MG tablet Take 1,000 mg by mouth every 6 (six) hours as needed for fever.   Yes Historical Provider, MD  ALPRAZolam Prudy Feeler(XANAX) 1 MG tablet Take 1 tablet (1 mg total) by mouth 3 (three) times daily. 12/13/13 12/13/14 Yes Diannia Rudereborah Ross, MD  AMITIZA 24 MCG capsule Take 24 mcg by mouth 2 (two) times  daily with a meal.  08/30/13  Yes Historical Provider, MD  Brexpiprazole (REXULTI) 2 MG TABS Take 2 mg by mouth daily. 12/13/13  Yes Diannia Ruder, MD  DULoxetine (CYMBALTA) 60 MG capsule Take 1 capsule (60 mg total) by mouth 2 (two) times daily. 11/29/13  Yes Diannia Ruder, MD  ibuprofen (ADVIL,MOTRIN) 200 MG tablet Take 400 mg by mouth every 8 (eight) hours as needed. pain   Yes Historical Provider, MD  multivitamin-lutein (OCUVITE-LUTEIN) CAPS capsule Take 1 capsule by mouth daily.   Yes Historical Provider, MD  NEXIUM 40 MG capsule Take 40 mg by mouth daily.  08/30/13  Yes Historical Provider, MD  OPANA  ER, CRUSH RESISTANT, 20 MG T12A Take 1 tablet by mouth 2 (two) times daily as needed (severe pain).  12/10/13  Yes Historical Provider, MD  OXcarbazepine (TRILEPTAL) 150 MG tablet Take one in the am and 2 at bedtime Patient taking differently: Take 150-300 mg by mouth 2 (two) times daily. Take one in the am and 2 at bedtime 11/29/13  Yes Diannia Ruder, MD  Oxycodone HCl 10 MG TABS Take 10 mg by mouth 4 (four) times daily.  08/22/13  Yes Historical Provider, MD  Phenylephrine-DM-GG (TUSSIN CF PO) Take 10 mLs by mouth 3 (three) times daily as needed (cough).   Yes Historical Provider, MD  promethazine (PHENERGAN) 25 MG tablet Take 25 mg by mouth 4 (four) times daily as needed. Stomach pain and nausea   Yes Historical Provider, MD  rizatriptan (MAXALT-MLT) 10 MG disintegrating tablet Take 10 mg by mouth as needed. May repeat in 2 hours if needed, maximum of 3 tablets in a 24 hour period   Yes Historical Provider, MD  Simethicone (GAS-X PO) Take 1 tablet by mouth daily as needed (gas).   Yes Historical Provider, MD  Suvorexant (BELSOMRA) 20 MG TABS Take 20 mg by mouth at bedtime. 11/29/13  Yes Diannia Ruder, MD   BP 110/65 mmHg  Pulse 87  Temp(Src) 99.3 F (37.4 C) (Oral)  Resp 16  Ht 5' 8.5" (1.74 m)  Wt 163 lb 1 oz (73.965 kg)  BMI 24.43 kg/m2  SpO2 100% Physical Exam  Constitutional: She is oriented to person, place, and time. She appears well-developed and well-nourished.  HENT:  Head: Normocephalic and atraumatic.  Eyes: Conjunctivae and EOM are normal. Pupils are equal, round, and reactive to light.  Neck: Normal range of motion. Neck supple.  Cardiovascular: Normal rate, regular rhythm and normal heart sounds.   Pulmonary/Chest: Effort normal. She has wheezes.  Bilateral expiratory wheezing.  Abdominal: Soft. Bowel sounds are normal.  Musculoskeletal: Normal range of motion.  Lower extremities symmetric as compared to each other. No calf tenderness. Negative Homan's. No palpable cords.    Neurological: She is alert and oriented to person, place, and time.  Skin: Skin is warm and dry.  Psychiatric: She has a normal mood and affect. Her behavior is normal.  Nursing note and vitals reviewed.   ED Course  Procedures  DIAGNOSTIC STUDIES: Oxygen Saturation is 100% on room air, normal by my interpretation.    COORDINATION OF CARE: 1:04 PM-Discussed treatment plan which includes medications and inhaler with pt at bedside and pt agreed to plan.    Labs Review Labs Reviewed - No data to display  Imaging Review Dg Chest 2 View  12/19/2013   CLINICAL DATA:  Cough.  EXAM: CHEST  2 VIEW  COMPARISON:  January 25, 2012.  FINDINGS: The heart size and mediastinal contours are within  normal limits. Both lungs are clear. No pneumothorax or pleural effusion is noted. The visualized skeletal structures are unremarkable.  IMPRESSION: No acute cardiopulmonary abnormality seen.   Electronically Signed   By: Roque LiasJames  Green M.D.   On: 12/19/2013 12:56     EKG Interpretation None      MDM   Final diagnoses:  Bronchitis    53 year old female with cough and wheezing. Likely viral bronchitis. No simply increased work of breathing. Oxygen saturations are normal on room air. Chest x-ray with no focal infiltrate. Plan symptomatic treatment and a course of steroids. Return precautions were discussed.  I personally preformed the services scribed in my presence. The recorded information has been reviewed is accurate. Vicki Thomas Cecily Lawhorne, MD.    Vicki Thomas Mikaele Stecher, MD 12/26/13 2031

## 2013-12-25 ENCOUNTER — Other Ambulatory Visit (HOSPITAL_COMMUNITY): Payer: Self-pay | Admitting: Psychiatry

## 2013-12-31 ENCOUNTER — Telehealth (HOSPITAL_COMMUNITY): Payer: Self-pay | Admitting: *Deleted

## 2014-01-01 ENCOUNTER — Ambulatory Visit (INDEPENDENT_AMBULATORY_CARE_PROVIDER_SITE_OTHER): Payer: 59 | Admitting: Psychiatry

## 2014-01-01 ENCOUNTER — Telehealth (HOSPITAL_COMMUNITY): Payer: Self-pay | Admitting: *Deleted

## 2014-01-01 ENCOUNTER — Encounter (HOSPITAL_COMMUNITY): Payer: Self-pay | Admitting: Psychiatry

## 2014-01-01 VITALS — BP 99/55 | HR 80 | Ht 68.5 in | Wt 165.8 lb

## 2014-01-01 DIAGNOSIS — F4310 Post-traumatic stress disorder, unspecified: Secondary | ICD-10-CM

## 2014-01-01 DIAGNOSIS — F3160 Bipolar disorder, current episode mixed, unspecified: Secondary | ICD-10-CM

## 2014-01-01 MED ORDER — OXCARBAZEPINE 150 MG PO TABS: ORAL_TABLET | ORAL | Status: DC

## 2014-01-01 MED ORDER — DULOXETINE HCL 60 MG PO CPEP: 60.0000 mg | ORAL_CAPSULE | Freq: Two times a day (BID) | ORAL | Status: DC

## 2014-01-01 MED ORDER — SUVOREXANT 20 MG PO TABS: 20.0000 mg | ORAL_TABLET | Freq: Every day | ORAL | Status: DC
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
## 2014-01-02 ENCOUNTER — Ambulatory Visit (HOSPITAL_COMMUNITY): Payer: Self-pay | Admitting: Psychiatry

## 2014-01-07 ENCOUNTER — Ambulatory Visit (HOSPITAL_COMMUNITY): Payer: Self-pay | Admitting: Psychiatry

## 2014-01-09 ENCOUNTER — Telehealth (HOSPITAL_COMMUNITY): Payer: Self-pay | Admitting: *Deleted

## 2014-01-09 NOTE — Telephone Encounter (Signed)
Pt calling stating she stopped taking her medications and she feels like she is getting back into depression. Pt states she is taking 3 Phenergan 25 mg to help her sleep. Pt states she is not taking her Rexulti due to her swelling and her fibromyalgia have restarted and she thinks it because of it. Per pt she need Dr. Tenny Crawoss to call her back. Per pt, she is not suicidal. Pt number is [phone].

## 2014-01-09 NOTE — Telephone Encounter (Signed)
Patient told to get into intensive outpt program

## 2014-01-10 NOTE — Telephone Encounter (Signed)
noted 

## 2014-01-13 ENCOUNTER — Emergency Department (HOSPITAL_COMMUNITY)
Admission: EM | Admit: 2014-01-13 | Discharge: 2014-01-13 | Disposition: A | Discharge status: Other Acute Inpatient Hospital | Payer: PRIVATE HEALTH INSURANCE | Attending: Emergency Medicine | Admitting: Emergency Medicine

## 2014-01-13 ENCOUNTER — Inpatient Hospital Stay (HOSPITAL_COMMUNITY)
Admission: AD | Admit: 2014-01-13 | Discharge: 2014-01-20 | DRG: 885 | Disposition: A | Discharge status: Home / Self Care | Payer: 59 | Source: Intra-hospital | Attending: Psychiatry | Admitting: Psychiatry

## 2014-01-13 ENCOUNTER — Emergency Department (HOSPITAL_COMMUNITY): Payer: PRIVATE HEALTH INSURANCE

## 2014-01-13 ENCOUNTER — Encounter (HOSPITAL_COMMUNITY): Payer: Self-pay | Admitting: Emergency Medicine

## 2014-01-13 ENCOUNTER — Encounter (HOSPITAL_COMMUNITY): Payer: Self-pay | Admitting: *Deleted

## 2014-01-13 DIAGNOSIS — F4310 Post-traumatic stress disorder, unspecified: Secondary | ICD-10-CM | POA: Diagnosis present

## 2014-01-13 DIAGNOSIS — J45909 Unspecified asthma, uncomplicated: Secondary | ICD-10-CM | POA: Insufficient documentation

## 2014-01-13 DIAGNOSIS — R001 Bradycardia, unspecified: Secondary | ICD-10-CM | POA: Diagnosis not present

## 2014-01-13 DIAGNOSIS — Z79899 Other long term (current) drug therapy: Secondary | ICD-10-CM | POA: Diagnosis not present

## 2014-01-13 DIAGNOSIS — G8929 Other chronic pain: Secondary | ICD-10-CM | POA: Diagnosis not present

## 2014-01-13 DIAGNOSIS — R45851 Suicidal ideations: Secondary | ICD-10-CM | POA: Diagnosis present

## 2014-01-13 DIAGNOSIS — Z87448 Personal history of other diseases of urinary system: Secondary | ICD-10-CM | POA: Insufficient documentation

## 2014-01-13 DIAGNOSIS — T1491XA Suicide attempt, initial encounter: Secondary | ICD-10-CM

## 2014-01-13 DIAGNOSIS — Z720 Tobacco use: Secondary | ICD-10-CM | POA: Diagnosis not present

## 2014-01-13 DIAGNOSIS — Z3202 Encounter for pregnancy test, result negative: Secondary | ICD-10-CM | POA: Insufficient documentation

## 2014-01-13 DIAGNOSIS — Z008 Encounter for other general examination: Secondary | ICD-10-CM | POA: Diagnosis present

## 2014-01-13 DIAGNOSIS — R4182 Altered mental status, unspecified: Secondary | ICD-10-CM | POA: Diagnosis not present

## 2014-01-13 DIAGNOSIS — T402X2A Poisoning by other opioids, intentional self-harm, initial encounter: Secondary | ICD-10-CM | POA: Insufficient documentation

## 2014-01-13 DIAGNOSIS — F319 Bipolar disorder, unspecified: Secondary | ICD-10-CM | POA: Diagnosis present

## 2014-01-13 DIAGNOSIS — Z880 Allergy status to penicillin: Secondary | ICD-10-CM | POA: Insufficient documentation

## 2014-01-13 DIAGNOSIS — T50902A Poisoning by unspecified drugs, medicaments and biological substances, intentional self-harm, initial encounter: Secondary | ICD-10-CM

## 2014-01-13 LAB — BLOOD GAS, ARTERIAL
Acid-Base Excess: 2.1 mmol/L — ABNORMAL HIGH (ref 0.0–2.0)
BICARBONATE: 25.9 meq/L — AB (ref 20.0–24.0)
Drawn by: 213101
O2 SAT: 95.9 %
Patient temperature: 37
TCO2: 23.4 mmol/L (ref 0–100)
pCO2 arterial: 38.2 mmHg (ref 35.0–45.0)
pH, Arterial: 7.446 (ref 7.350–7.450)
pO2, Arterial: 79.9 mmHg — ABNORMAL LOW (ref 80.0–100.0)

## 2014-01-13 LAB — CBC WITH DIFFERENTIAL/PLATELET
BASOS ABS: 0 10*3/uL (ref 0.0–0.1)
Basophils Relative: 0 % (ref 0–1)
Eosinophils Absolute: 0.1 10*3/uL (ref 0.0–0.7)
Eosinophils Relative: 2 % (ref 0–5)
HCT: 33.2 % — ABNORMAL LOW (ref 36.0–46.0)
HEMOGLOBIN: 11.3 g/dL — AB (ref 12.0–15.0)
Lymphocytes Relative: 40 % (ref 12–46)
Lymphs Abs: 2.1 10*3/uL (ref 0.7–4.0)
MCH: 32.7 pg (ref 26.0–34.0)
MCHC: 34 g/dL (ref 30.0–36.0)
MCV: 96 fL (ref 78.0–100.0)
MONOS PCT: 9 % (ref 3–12)
Monocytes Absolute: 0.5 10*3/uL (ref 0.1–1.0)
NEUTROS PCT: 49 % (ref 43–77)
Neutro Abs: 2.6 10*3/uL (ref 1.7–7.7)
Platelets: 191 10*3/uL (ref 150–400)
RBC: 3.46 MIL/uL — ABNORMAL LOW (ref 3.87–5.11)
RDW: 14.1 % (ref 11.5–15.5)
WBC: 5.4 10*3/uL (ref 4.0–10.5)

## 2014-01-13 LAB — URINALYSIS, ROUTINE W REFLEX MICROSCOPIC
Bilirubin Urine: NEGATIVE
Glucose, UA: NEGATIVE mg/dL
Hgb urine dipstick: NEGATIVE
KETONES UR: NEGATIVE mg/dL
LEUKOCYTES UA: NEGATIVE
NITRITE: NEGATIVE
PROTEIN: NEGATIVE mg/dL
Specific Gravity, Urine: <1.005 — ABNORMAL LOW (ref 1.005–1.030)
Urobilinogen, UA: 0.2 mg/dL (ref 0.0–1.0)
pH: 6 (ref 5.0–8.0)

## 2014-01-13 LAB — COMPREHENSIVE METABOLIC PANEL
ALBUMIN: 2.8 g/dL — AB (ref 3.5–5.2)
ALK PHOS: 153 U/L — AB (ref 39–117)
ALT: 13 U/L (ref 0–35)
AST: 20 U/L (ref 0–37)
Anion gap: 10 (ref 5–15)
BILIRUBIN TOTAL: 0.2 mg/dL — AB (ref 0.3–1.2)
BUN: 3 mg/dL — ABNORMAL LOW (ref 6–23)
CHLORIDE: 96 meq/L (ref 96–112)
CO2: 26 mEq/L (ref 19–32)
Calcium: 8.3 mg/dL — ABNORMAL LOW (ref 8.4–10.5)
Creatinine, Ser: 0.52 mg/dL (ref 0.50–1.10)
GFR calc Af Amer: >90 mL/min (ref >90)
GFR calc non Af Amer: >90 mL/min (ref >90)
Glucose, Bld: 103 mg/dL — ABNORMAL HIGH (ref 70–99)
POTASSIUM: 4 meq/L (ref 3.7–5.3)
Sodium: 132 mEq/L — ABNORMAL LOW (ref 137–147)
Total Protein: 5.2 g/dL — ABNORMAL LOW (ref 6.0–8.3)

## 2014-01-13 LAB — ACETAMINOPHEN LEVEL: Acetaminophen (Tylenol), Serum: <15 ug/mL (ref 10–30)

## 2014-01-13 LAB — PREGNANCY, URINE: Preg Test, Ur: NEGATIVE

## 2014-01-13 LAB — TROPONIN I: Troponin I: <0.3 ng/mL (ref <0.30)

## 2014-01-13 LAB — RAPID URINE DRUG SCREEN, HOSP PERFORMED
AMPHETAMINES: NOT DETECTED
Barbiturates: NOT DETECTED
Benzodiazepines: POSITIVE — AB
Cocaine: NOT DETECTED
Opiates: NOT DETECTED
TETRAHYDROCANNABINOL: NOT DETECTED

## 2014-01-13 LAB — ETHANOL

## 2014-01-13 LAB — SALICYLATE LEVEL: Salicylate Lvl: <2 mg/dL — ABNORMAL LOW (ref 2.8–20.0)

## 2014-01-13 MED ORDER — ZOLPIDEM TARTRATE 5 MG PO TABS: 5.0000 mg | ORAL_TABLET | Freq: Every evening | ORAL | Status: DC | PRN

## 2014-01-13 MED ORDER — PNEUMOCOCCAL VAC POLYVALENT 25 MCG/0.5ML IJ INJ: 0.5000 mL | INJECTION | INTRAMUSCULAR | Status: DC

## 2014-01-13 MED ORDER — SODIUM CHLORIDE 0.9 % IV SOLN
1000.0000 mL | Freq: Once | INTRAVENOUS | Status: AC
  Administered 2014-01-13: 1000 mL via INTRAVENOUS

## 2014-01-13 MED ORDER — ZOLPIDEM TARTRATE 5 MG PO TABS
5.0000 mg | ORAL_TABLET | Freq: Every evening | ORAL | Status: DC | PRN
  Administered 2014-01-13: 5 mg via ORAL
  Filled 2014-01-13: qty 1

## 2014-01-13 MED ORDER — IBUPROFEN 600 MG PO TABS
600.0000 mg | ORAL_TABLET | Freq: Three times a day (TID) | ORAL | Status: DC | PRN
  Administered 2014-01-13 – 2014-01-16 (×5): 600 mg via ORAL
  Filled 2014-01-13 (×6): qty 1

## 2014-01-13 MED ORDER — SODIUM CHLORIDE 0.9 % IV SOLN
1000.0000 mL | INTRAVENOUS | Status: DC
  Administered 2014-01-13: 1000 mL via INTRAVENOUS

## 2014-01-13 MED ORDER — LORAZEPAM 1 MG PO TABS
1.0000 mg | ORAL_TABLET | Freq: Three times a day (TID) | ORAL | Status: DC | PRN
  Administered 2014-01-13: 1 mg via ORAL
  Filled 2014-01-13: qty 1

## 2014-01-13 MED ORDER — AMMONIA AROMATIC IN INHA
RESPIRATORY_TRACT | Status: AC
  Filled 2014-01-13: qty 10

## 2014-01-13 MED ORDER — NALOXONE HCL 0.4 MG/ML IJ SOLN
0.4000 mg | Freq: Once | INTRAMUSCULAR | Status: AC
  Administered 2014-01-13: 0.4 mg via INTRAVENOUS
  Filled 2014-01-13: qty 1

## 2014-01-13 MED ORDER — LORAZEPAM 1 MG PO TABS: 1.0000 mg | ORAL_TABLET | Freq: Three times a day (TID) | ORAL | Status: DC | PRN

## 2014-01-13 MED ORDER — ACETAMINOPHEN 325 MG PO TABS
650.0000 mg | ORAL_TABLET | ORAL | Status: DC | PRN
  Administered 2014-01-16 – 2014-01-20 (×4): 650 mg via ORAL
  Filled 2014-01-13 (×4): qty 2

## 2014-01-13 MED ORDER — NICOTINE 21 MG/24HR TD PT24
21.0000 mg | MEDICATED_PATCH | Freq: Every day | TRANSDERMAL | Status: DC
  Filled 2014-01-13: qty 1

## 2014-01-13 MED ORDER — ALUM & MAG HYDROXIDE-SIMETH 200-200-20 MG/5ML PO SUSP: 30.0000 mL | ORAL | Status: DC | PRN

## 2014-01-13 MED ORDER — ACETAMINOPHEN 325 MG PO TABS: 650.0000 mg | ORAL_TABLET | ORAL | Status: DC | PRN

## 2014-01-13 MED ORDER — NICOTINE 21 MG/24HR TD PT24
21.0000 mg | MEDICATED_PATCH | Freq: Every day | TRANSDERMAL | Status: DC
  Filled 2014-01-13 (×8): qty 1

## 2014-01-13 MED ORDER — IBUPROFEN 400 MG PO TABS: 600.0000 mg | ORAL_TABLET | Freq: Three times a day (TID) | ORAL | Status: DC | PRN

## 2014-01-13 MED ORDER — INFLUENZA VAC SPLIT QUAD 0.5 ML IM SUSY
0.5000 mL | PREFILLED_SYRINGE | INTRAMUSCULAR | Status: DC
  Filled 2014-01-13: qty 0.5

## 2014-01-13 NOTE — ED Notes (Signed)
Spoke with BH again, states pt's bed is ready she can come once she is more awake and alert.

## 2014-01-13 NOTE — ED Notes (Signed)
[facility] and is pending bed placement.

## 2014-01-13 NOTE — Progress Notes (Signed)
Patient did not attend the evening speaker AA meeting. Pt was notified that group was beginning but remained in her room.   
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
## 2014-01-13 NOTE — Progress Notes (Addendum)
D:Patient in the hallway on approach.  Patient tearful and states she does not want to be at the facility.  Patient family brought in her belongings and patient was able to look at her cell phone and write down important phone numbers.  Patient appears startled when staff walk into the room or she hears a sound.  Patient does not appear to have insight and states states she is unsure of why she took an overdose of her pain medications.  Patient denies SI/HI and denies AVH. A: Staff to monitor Q 15 mins for safety.  Encouragement and support offered.  No scheduled medications administered per orders.  Ibuprofen administered prn for pain.  Ativan administered prn for anxiety.   R: Patient remains safe on the unit.  Patient did not attend group tonight.  Patient taking administered medications.  Patient visible on the unit but not interacting with peers.

## 2014-01-13 NOTE — ED Notes (Signed)
Per EMS, patient wrote a note and called family and friends to say goodbye.  Patient has an empty Oxycodone pill bottle; filled on 01/03/14 with 120 tablets.  Patient told EMS that she puts pills around the house so that she will always have one in each room.  Patient refusing to answer questions.

## 2014-01-13 NOTE — BH Assessment (Signed)
Assessment complete. Counselor consulted with Vicki Thomas-NP. Per Vicki Thomas Pt meets inpatient criteria. TTS to seek placement.  Counselor informed RN-Mindy and EDP-Cook  Wolfgang PhoenixBrandi Lilianna Case, [facility] Triage Specialist

## 2014-01-13 NOTE — ED Notes (Signed)
IV d/c cath intact

## 2014-01-13 NOTE — ED Notes (Signed)
Tele-psych assessment being performed at this time.

## 2014-01-13 NOTE — ED Notes (Signed)
Patient has been sleeping since arrival to ED

## 2014-01-13 NOTE — ED Notes (Signed)
Pt offered meal at this time, pt still very drowsy but awakens with stimulation.  Pt did not want to eat at this time.

## 2014-01-13 NOTE — ED Provider Notes (Signed)
CSN: [id number]     Arrival date & time 01/13/14  0207 History   First MD Initiated Contact with Patient 01/13/14 0234     Chief Complaint  Patient presents with  . V70.1   Level V caveat for altered mental status  (Consider location/radiation/quality/duration/timing/severity/associated sxs/prior Treatment) HPI  Per EMS patient wrote a suicide note and then called her family and friends to say goodbye. Patient had a bottle of oxycodone filled on December 10 with 120 tablets that was empty. However patient states she puts pills around the house that she can get easy access to them. At the time of my exam patient is sleeping and does not arouse to verbal or chest rub.  PCP Dr Lindon Romp  Past Medical History  Diagnosis Date  . Interstitial cystitis   . Fibromyalgia   . Chronic fatigue   . Asthma   . Arthritis   . Bipolar 1 disorder   . PTSD (post-traumatic stress disorder)   . Lymphedema   . Panic disorder with agoraphobia   . Chronic pain   . Hiatal hernia   . Chronic headaches   . Chronic back pain    Past Surgical History  Procedure Laterality Date  . Cholecystectomy    . Abdominal hysterectomy    . Tonsillectomy    . Foot surgery    . Bariatric surgery    . Hemorrhoid surgery    . Hernia repair    . Esophagus surgery    . Appendectomy     Family History  Problem Relation Age of Onset  . Alcohol abuse Mother   . Bipolar disorder Father   . Paranoid behavior Father   . Alcohol abuse Father   . Bipolar disorder Sister   . Anxiety disorder Sister   . Alcohol abuse Brother   . Drug abuse Brother   . Bipolar disorder Paternal Aunt   . Schizophrenia Maternal Grandfather   . Anxiety disorder Maternal Aunt    History  Substance Use Topics  . Smoking status: Current Every Day Smoker -- 1.00 packs/day for 6 years  . Smokeless tobacco: Never Used  . Alcohol Use: No   OB History    No data available     Review of Systems  All other systems reviewed and are  negative.     Allergies  Bee venom; Erythromycin; Haldol; Penicillins; Abilify; Aripiprazole; Ciprocin-fluocin-procin; Fexofenadine hcl; Gabapentin; Iodine; Latuda; Lithium; Marcaine; Paroxetine hcl; Pregabalin; Prozac; Remeron; Seroquel; Tape; Zofran; and Zonisamide  Home Medications   Prior to Admission medications   Medication Sig Start Date End Date Taking? Authorizing Provider  acetaminophen (TYLENOL) 500 MG tablet Take 1,000 mg by mouth every 6 (six) hours as needed for fever.   Yes Historical Provider, MD  ALPRAZolam Prudy Feeler) 1 MG tablet Take 1 tablet (1 mg total) by mouth 3 (three) times daily. 12/13/13 12/13/14 Yes Diannia Ruder, MD  AMITIZA 24 MCG capsule Take 24 mcg by mouth 2 (two) times daily with a meal.  08/30/13  Yes Historical Provider, MD  DULoxetine (CYMBALTA) 60 MG capsule Take 1 capsule (60 mg total) by mouth 2 (two) times daily. 01/01/14  Yes Diannia Ruder, MD  furosemide (LASIX) 40 MG tablet Take 40 mg by mouth daily.   Yes Historical Provider, MD  ibuprofen (ADVIL,MOTRIN) 200 MG tablet Take 400 mg by mouth every 8 (eight) hours as needed. pain   Yes Historical Provider, MD  NEXIUM 40 MG capsule Take 40 mg by mouth daily.  08/30/13  Yes  Historical Provider, MD  OPANA ER, CRUSH RESISTANT, 20 MG T12A Take 1 tablet by mouth 2 (two) times daily as needed (severe pain).  12/10/13  Yes Historical Provider, MD  OXcarbazepine (TRILEPTAL) 150 MG tablet Take one in the am and 2 at bedtime 01/01/14  Yes Diannia Ruder, MD  Oxycodone HCl 10 MG TABS Take 15 mg by mouth 4 (four) times daily.  08/22/13  Yes Historical Provider, MD  promethazine (PHENERGAN) 25 MG tablet Take 25 mg by mouth 4 (four) times daily as needed. Stomach pain and nausea   Yes Historical Provider, MD  Brexpiprazole (REXULTI) 2 MG TABS Take 2 mg by mouth daily. 12/13/13   Diannia Ruder, MD  multivitamin-lutein [facility]) CAPS capsule Take 1 capsule by mouth daily.    Historical Provider, MD  Phenylephrine-DM-GG (TUSSIN  CF PO) Take 10 mLs by mouth 3 (three) times daily as needed (cough).    Historical Provider, MD  rizatriptan (MAXALT-MLT) 10 MG disintegrating tablet Take 10 mg by mouth as needed. May repeat in 2 hours if needed, maximum of 3 tablets in a 24 hour period    Historical Provider, MD  Simethicone (GAS-X PO) Take 1 tablet by mouth daily as needed (gas).    Historical Provider, MD  Suvorexant (BELSOMRA) 20 MG TABS Take 20 mg by mouth at bedtime. 01/01/14   Diannia Ruder, MD   BP 132/75 mmHg  Pulse 58  Temp(Src) 98 F (36.7 C) (Oral)  Resp 12  SpO2 100%  Vital signs normal except bradycardia  Physical Exam  Constitutional: She appears well-developed and well-nourished.  Non-toxic appearance. She does not appear ill. No distress.  HENT:  Head: Normocephalic and atraumatic.  Right Ear: External ear normal.  Left Ear: External ear normal.  Nose: Nose normal. No mucosal edema or rhinorrhea.  Mouth/Throat: Mucous membranes are normal. No dental abscesses or uvula swelling.  Dry mucous membranes  Eyes: Conjunctivae and EOM are normal. Pupils are equal, round, and reactive to light.  Neck: Normal range of motion and full passive range of motion without pain. Neck supple.  Cardiovascular: Regular rhythm and normal heart sounds.  Bradycardia present.  Exam reveals no gallop and no friction rub.   No murmur heard. Pulmonary/Chest: Effort normal and breath sounds normal. No respiratory distress. She has no wheezes. She has no rhonchi. She has no rales. She exhibits no tenderness and no crepitus.  Abdominal: Soft. Normal appearance and bowel sounds are normal. She exhibits no distension. There is no tenderness. There is no rebound and no guarding.  Musculoskeletal: Normal range of motion. She exhibits no edema or tenderness.  Moves all extremities well.   Neurological: She is alert. She has normal strength. No cranial nerve deficit.  Flaccid extremities, arm drop over her face she does not protect her  face  Skin: Skin is warm, dry and intact. No rash noted. No erythema. No pallor.  Psychiatric: She is noncommunicative.  Nursing note and vitals reviewed.      ED Course  Procedures (including critical care time)  Medications  ammonia inhalant (0 mLs  Hold 01/13/14 0340)  0.9 %  sodium chloride infusion (0 mLs Intravenous Stopped 01/13/14 0500)    Followed by  0.9 %  sodium chloride infusion (0 mLs Intravenous Stopped 01/13/14 0414)    Followed by  0.9 %  sodium chloride infusion (1,000 mLs Intravenous New Bag/Given 01/13/14 0501)  naloxone [facility]) injection 0.4 mg (0.4 mg Intravenous Given 01/13/14 0337)    Patient was given low-dose  Narcan and is slightly easier to arouse.  At this point her overdose appears to be most consistent with a benzodiazepine overdose with the lethargy and her UDS is not positive for opiates. Patient will be observed in the emergency department until she is able to cooperate with the psychiatric evaluation. At this point with the suicide note she will need inpatient treatment. Although patient has lethargy she is oxygenating well and able to protect her airway.   Labs Review Results for orders placed or performed during the hospital encounter of 01/13/14  CBC WITH DIFFERENTIAL  Result Value Ref Range   WBC 5.4 4.0 - 10.5 K/uL   RBC 3.46 (L) 3.87 - 5.11 MIL/uL   Hemoglobin 11.3 (L) 12.0 - 15.0 g/dL   HCT 16.133.2 (L) 09.636.0 - 04.546.0 %   MCV 96.0 78.0 - 100.0 fL   MCH 32.7 26.0 - 34.0 pg   MCHC 34.0 30.0 - 36.0 g/dL   RDW 40.914.1 81.111.5 - 91.415.5 %   Platelets 191 150 - 400 K/uL   Neutrophils Relative % 49 43 - 77 %   Neutro Abs 2.6 1.7 - 7.7 K/uL   Lymphocytes Relative 40 12 - 46 %   Lymphs Abs 2.1 0.7 - 4.0 K/uL   Monocytes Relative 9 3 - 12 %   Monocytes Absolute 0.5 0.1 - 1.0 K/uL   Eosinophils Relative 2 0 - 5 %   Eosinophils Absolute 0.1 0.0 - 0.7 K/uL   Basophils Relative 0 0 - 1 %   Basophils Absolute 0.0 0.0 - 0.1 K/uL  Comprehensive metabolic panel   Result Value Ref Range   Sodium 132 (L) 137 - 147 mEq/L   Potassium 4.0 3.7 - 5.3 mEq/L   Chloride 96 96 - 112 mEq/L   CO2 26 19 - 32 mEq/L   Glucose, Bld 103 (H) 70 - 99 mg/dL   BUN 3 (L) 6 - 23 mg/dL   Creatinine, Ser 7.820.52 0.50 - 1.10 mg/dL   Calcium 8.3 (L) 8.4 - 10.5 mg/dL   Total Protein 5.2 (L) 6.0 - 8.3 g/dL   Albumin 2.8 (L) 3.5 - 5.2 g/dL   AST 20 0 - 37 U/L   ALT 13 0 - 35 U/L   Alkaline Phosphatase 153 (H) 39 - 117 U/L   Total Bilirubin 0.2 (L) 0.3 - 1.2 mg/dL   GFR calc non Af Amer >90 >90 mL/min   GFR calc Af Amer >90 >90 mL/min   Anion gap 10 5 - 15  Drug screen panel, emergency  Result Value Ref Range   Opiates NONE DETECTED NONE DETECTED   Cocaine NONE DETECTED NONE DETECTED   Benzodiazepines POSITIVE (A) NONE DETECTED   Amphetamines NONE DETECTED NONE DETECTED   Tetrahydrocannabinol NONE DETECTED NONE DETECTED   Barbiturates NONE DETECTED NONE DETECTED  Ethanol  Result Value Ref Range   Alcohol, Ethyl (B) <11 0 - 11 mg/dL  Urinalysis, Routine w reflex microscopic  Result Value Ref Range   Color, Urine YELLOW YELLOW   APPearance CLEAR CLEAR   Specific Gravity, Urine <1.005 (L) 1.005 - 1.030   pH 6.0 5.0 - 8.0   Glucose, UA NEGATIVE NEGATIVE mg/dL   Hgb urine dipstick NEGATIVE NEGATIVE   Bilirubin Urine NEGATIVE NEGATIVE   Ketones, ur NEGATIVE NEGATIVE mg/dL   Protein, ur NEGATIVE NEGATIVE mg/dL   Urobilinogen, UA 0.2 0.0 - 1.0 mg/dL   Nitrite NEGATIVE NEGATIVE   Leukocytes, UA NEGATIVE NEGATIVE  Acetaminophen level  Result Value Ref Range  Acetaminophen (Tylenol), Serum <15.0 10 - 30 ug/mL  Salicylate level  Result Value Ref Range   Salicylate Lvl <2.0 (L) 2.8 - 20.0 mg/dL  Pregnancy, urine  Result Value Ref Range   Preg Test, Ur NEGATIVE NEGATIVE  Blood gas, arterial ([facility] & [facility] ONLY)  Result Value Ref Range   Delivery systems ROOM AIR    pH, Arterial 7.446 7.350 - 7.450   pCO2 arterial 38.2 35.0 - 45.0 mmHg   pO2, Arterial 79.9 (L) 80.0 -  100.0 mmHg   Bicarbonate 25.9 (H) 20.0 - 24.0 mEq/L   TCO2 23.4 0 - 100 mmol/L   Acid-Base Excess 2.1 (H) 0.0 - 2.0 mmol/L   O2 Saturation 95.9 %   Patient temperature 37.0    Collection site LEFT RADIAL    Drawn by [id number]    Sample type ARTERIAL    Allens test (pass/fail) PASS PASS  Troponin I  Result Value Ref Range   Troponin I <0.30 <0.30 ng/mL    Laboratory interpretation all normal except positive UDS, mild anemia   Imaging Review Dg Chest Portable 1 View  01/13/2014   CLINICAL DATA:  Initial valuation for altered mental status, respiratory distress.  EXAM: PORTABLE CHEST - 1 VIEW  COMPARISON:  Prior radiograph from 12/19/2013  FINDINGS: Cardiac and mediastinal silhouettes are stable in size and contour, and remain within normal limits.  Lungs are hypoinflated. There is diffuse pulmonary vascular congestion with indistinctness of the interstitial markings without overt pulmonary edema. This finding favored to enlarged prior Re related to shallow lung inflation. No focal infiltrate. No pneumothorax.  Surgical clips overlie the left upper quadrant.  No acute osseus abnormality.  IMPRESSION: Shallow lung inflation with secondary diffuse pulmonary vascular congestion and bronchovascular crowding. No definite cardiopulmonary abnormality.   Electronically Signed   By: Rise MuBenjamin  McClintock M.D.   On: 01/13/2014 05:11     EKG Interpretation   Date/Time:  Sunday January 13 2014 03:40:52 EST Ventricular Rate:  63 PR Interval:  133 QRS Duration: 86 QT Interval:  440 QTC Calculation: 450 R Axis:   74 Text Interpretation:  Sinus rhythm Baseline wander in lead(s) V2 V3  Otherwise within normal limits No significant change since last tracing 13 Nov 2012 Confirmed by [facility]  MD-I, Linnea Todisco ([id number]) on 01/13/2014 3:54:38 AM      MDM   Final diagnoses:  Altered mental status  Drug overdose, intentional, initial encounter  Suicide attempt    Disposition pending for inpatient psychiatric  admission  Devoria AlbeIva Kanchan Gal, MD, Franz DellFACEP     Hyland Mollenkopf L Asusena Sigley, MD 01/13/14 [id number]

## 2014-01-14 ENCOUNTER — Telehealth (HOSPITAL_COMMUNITY): Payer: Self-pay | Admitting: *Deleted

## 2014-01-14 DIAGNOSIS — R45851 Suicidal ideations: Secondary | ICD-10-CM

## 2014-01-14 MED ORDER — OLANZAPINE 5 MG PO TBDP
2.5000 mg | ORAL_TABLET | Freq: Once | ORAL | Status: DC
  Filled 2014-01-14: qty 0.5

## 2014-01-14 MED ORDER — DULOXETINE HCL 60 MG PO CPEP
60.0000 mg | ORAL_CAPSULE | Freq: Two times a day (BID) | ORAL | Status: DC
  Administered 2014-01-14 – 2014-01-16 (×4): 60 mg via ORAL
  Filled 2014-01-14 (×11): qty 1

## 2014-01-14 MED ORDER — OLANZAPINE 5 MG PO TBDP
2.5000 mg | ORAL_TABLET | Freq: Once | ORAL | Status: AC
  Administered 2014-01-14: 2.5 mg via ORAL
  Filled 2014-01-14: qty 0.5

## 2014-01-14 MED ORDER — LORAZEPAM 0.5 MG PO TABS
0.5000 mg | ORAL_TABLET | Freq: Three times a day (TID) | ORAL | Status: DC | PRN
  Administered 2014-01-14 – 2014-01-20 (×10): 0.5 mg via ORAL
  Filled 2014-01-14 (×10): qty 1

## 2014-01-14 MED ORDER — OXCARBAZEPINE 150 MG PO TABS
150.0000 mg | ORAL_TABLET | Freq: Two times a day (BID) | ORAL | Status: DC
  Administered 2014-01-14 – 2014-01-20 (×12): 150 mg via ORAL
  Filled 2014-01-14 (×9): qty 1
  Filled 2014-01-14: qty 10
  Filled 2014-01-14 (×2): qty 1
  Filled 2014-01-14: qty 10
  Filled 2014-01-14 (×4): qty 1

## 2014-01-14 MED ORDER — LORAZEPAM 0.5 MG PO TABS
0.5000 mg | ORAL_TABLET | Freq: Three times a day (TID) | ORAL | Status: DC
  Administered 2014-01-14 – 2014-01-19 (×14): 0.5 mg via ORAL
  Filled 2014-01-14 (×14): qty 1

## 2014-01-14 MED ORDER — OLANZAPINE 2.5 MG PO TABS
2.5000 mg | ORAL_TABLET | Freq: Every day | ORAL | Status: DC
  Administered 2014-01-14: 2.5 mg via ORAL
  Filled 2014-01-14 (×5): qty 1

## 2014-01-14 MED ORDER — OLANZAPINE 5 MG PO TBDP
ORAL_TABLET | ORAL | Status: AC
  Filled 2014-01-14: qty 1

## 2014-01-14 NOTE — Telephone Encounter (Signed)
lmtcb

## 2014-01-14 NOTE — Progress Notes (Signed)
D: Patient presents with depressed affect and mood. She declined completing the self inventory sheet today. Writer observed that the patient has had crying spells and have been extremely anxious throughout the day. Patient has been going to the beginnings of groups, but at some point walks out of the group. She reported having auditory hallucinations, stating that she hears "her grandson speaking and saying he wants to play." Patient adheres to medication regimen.  A: Support and encouragement provided to patient. Administered scheduled medications per ordering MD. Monitor Q15 minute checks for safety.  R: Patient receptive. Denies SI/HI and visual hallucinations. Patient remains safe on the unit.

## 2014-01-14 NOTE — [facility] Group Notes (Signed)
[facility]   01/14/2014 1:17 PM    Participation Quality:  Patient came to group but left in tears and did not return.   Sala Tague, Joesph JulyQuylle Hairston

## 2014-01-14 NOTE — Telephone Encounter (Signed)
Please ask Lajoyce CornersOctavia to call pt and get information
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
## 2014-01-14 NOTE — Telephone Encounter (Signed)
Voice message from patient, saying she needs to speak with doctor regarding medication she saw on the internet.

## 2014-01-14 NOTE — [facility] Suicide Risk Assessment (Signed)
   Nursing information obtained from:    Demographic factors:   53 year old single female, on disability, two adult children Current Mental Status:   See below Loss Factors:   disability, chronic pain Historical Factors:   has been diagnosed with Bipolar Disorder  Risk Reduction Factors:   Resilience  Total Time spent with patient: 45 minutes  CLINICAL FACTORS:  Depression, auditory hallucinations, recent suicidal attempt  Psychiatric Specialty Exam: Physical Exam  ROS  Blood pressure 112/61, pulse 66, temperature 98.7 F (37.1 C), temperature source Oral, resp. rate 16, height 5\' 8"  (1.727 m), weight 72.576 kg (160 lb), SpO2 99 %.Body mass index is 24.33 kg/(m^2).  SEE ADMIT NOTE MSE   COGNITIVE FEATURES THAT CONTRIBUTE TO RISK:  Closed-mindedness    SUICIDE RISK:   Moderate:  Frequent suicidal ideation with limited intensity, and duration, some specificity in terms of plans, no associated intent, good self-control, limited dysphoria/symptomatology, some risk factors present, and identifiable protective factors, including available and accessible social support.  PLAN OF CARE: Patient will be admitted to inpatient psychiatric unit for stabilization and safety. Will provide and encourage milieu participation. Provide medication management and maked adjustments as needed.  Will follow daily.    I certify that inpatient services furnished can reasonably be expected to improve the patient's condition.  COBOS, FERNANDO 01/14/2014, 1:04 PM
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
## 2014-01-14 NOTE — Progress Notes (Signed)
D: Patient in the hallway on approach.  Patient is preoccupied with her medications and states she is hearing voices.  Patient is displaying bizarre behavior  Tonight.  Patient states she is starting to feel manic.  Patient states she has some cream that she applies to her perineal area for pain.  Patient states she applies it 10 or more times a day.  Patient has no insight and is extremely anxious.  Patient denies SI/HI.  Patient states she hears he voice of her grandson talking to her.   A: Staff to monitor Q 15 mins for safety.  Encouragement and support offered.  Scheduled medications administered per orders.  Ativan administered prn for anxiety and agitation. R: Patient remains safe on the unit.  Patient attended group tonight.  Patient visible on the unit and interacting with peers.  Patient taking admisntered medications.

## 2014-01-14 NOTE — [facility] Group Notes (Signed)
[facility] LCSW Group Therapy  01/14/2014 4:20 PM  Type of Therapy:  Group Therapy  Participation Level:  Active  Participation Quality:  Attentive  Affect:  Blunted, Flat and Tearful  Cognitive:  Confused  Insight:  Developing/Improving  Engagement in Therapy:  Developing/Improving  Modes of Intervention:  Confrontation, Discussion, Education, Exploration, Problem-solving, Rapport Building and Support  Summary of Progress/Problems:  Patient attempted to participate but became tearful and appeared to be responding to internal stimuli.  Patient was encouraged to leave the group and return in an effort to calm herself.  Vicki Thomas, Vicki Thomas 01/14/2014, 4:20 PM

## 2014-01-14 NOTE — Progress Notes (Signed)
Patient appears to be responding to internal stimuli.  Patient accused Clinical research associatewriter of yelling but Clinical research associatewriter was standing with the patient quietly.  Patient then apologized and states she does not know if she is hearing voices.

## 2014-01-15 DIAGNOSIS — F319 Bipolar disorder, unspecified: Principal | ICD-10-CM

## 2014-01-15 LAB — TSH: TSH: 2.704 u[IU]/mL (ref 0.350–4.500)

## 2014-01-15 MED ORDER — LORAZEPAM 1 MG PO TABS
2.0000 mg | ORAL_TABLET | Freq: Once | ORAL | Status: AC
  Administered 2014-01-15: 2 mg via ORAL
  Filled 2014-01-15: qty 2

## 2014-01-15 MED ORDER — OLANZAPINE 5 MG PO TABS
5.0000 mg | ORAL_TABLET | Freq: Every day | ORAL | Status: DC
  Administered 2014-01-15 – 2014-01-19 (×5): 5 mg via ORAL
  Filled 2014-01-15: qty 2
  Filled 2014-01-15 (×4): qty 1
  Filled 2014-01-15: qty 5
  Filled 2014-01-15 (×3): qty 1

## 2014-01-15 NOTE — Progress Notes (Signed)
Pt did not attend group this morning.  

## 2014-01-15 NOTE — Progress Notes (Signed)
Patient states she wants to cath herself but patient is urinating moderate amounts of clear yellow urine.

## 2014-01-15 NOTE — Progress Notes (Addendum)
[facility] MD Progress Note  01/15/2014 5:57 PM Vicki Thomas  MRN:  [id number] Subjective:  Patient continues to report depression. She does state, however, that she feels better today. She is somatically focused, and describes chronic pain, particularly genital pain, which she states makes micturition difficult at times. States that " for a long time" she has been addressing this issue with lidocaine topical anesthetic and frequently, sometimes daily, needs to  " self cath"  Objective: I have discussed case with treatment team and met with patient.  As per staff, patient's presentation is variable - she has at times described severe/distressing hallucinations, but at others seems calm, polite, and even laughs appropriately at times. Patient acknowledges variable presentation and states she has " different personalities". No evidence of dissociation has been noted.  Patient reports partial improvement, an feels less depressed than before. She does report ongoing symptoms and describes hallucinations - at time of this session did not seem internally preoccupied or bizarre.  She is focused on somatic issues as above.  As per Nursing staff, patient has voided spontaneously, without need of catheterization.  She does respond to support , empathy, and mood , rang of affect tended to improve during session TSH within normal limits Diagnosis:  Bipolar Disorder NOS, PTSD per history.  Total Time spent with patient: 30 minutes    ADL's:  Improving   Sleep: fair   Appetite:  fair  Suicidal Ideation:  At this time denies any suicidal ideations , and contracts for safety on unit Homicidal Ideation:  Denies  AEB (as evidenced by):  Psychiatric Specialty Exam: Physical Exam  Review of Systems  Constitutional: Negative for fever and chills.  Respiratory: Negative for cough.   Cardiovascular: Negative for chest pain.  Gastrointestinal: Negative for vomiting.  Genitourinary: Negative.        Chronic  genital pain reported   Musculoskeletal: Positive for myalgias and back pain.  Skin: Negative for rash.  Psychiatric/Behavioral: Positive for depression and hallucinations.    Blood pressure 102/78, pulse 97, temperature 98.4 F (36.9 C), temperature source Oral, resp. rate 16, height 5' 8"  (1.727 m), weight 72.576 kg (160 lb), SpO2 99 %.Body mass index is 24.33 kg/(m^2).  General Appearance: Fairly Groomed  Engineer, water::  improved  Speech:  Normal Rate  Volume:  Decreased  Mood:  Depressed and but reports improvement compared to admission  Affect:  Constricted  Thought Process:  linear overall , becomes somewhat tangential with open ended questions  Orientation:  Other:  fully alert and attentive  Thought Content:  intermittent hallucinations, not internally preoccupied at this time. No dellusions expressed, somatically  focused  Suicidal Thoughts:  No at this time denies any thoughts of hurting self and  contracts for safety on unit   Homicidal Thoughts:  No  Memory:  Recent and remote fair   Judgement:  Fair  Insight:  Fair  Psychomotor Activity:  Normal  Concentration:  Fair  Recall:  Good  Fund of Knowledge:Good  Language: Good  Akathisia:  Negative  Handed:  Right  AIMS (if indicated):     Assets:  Desire for Improvement Resilience  Sleep:  Number of Hours: 4.75   Musculoskeletal: Strength & Muscle Tone: within normal limits Gait & Station: normal Patient leans: N/A  Current Medications: Current Facility-Administered Medications  Medication Dose Route Frequency Provider Last Rate Last Dose  . acetaminophen (TYLENOL) tablet 650 mg  650 mg Oral Q4H PRN Delfin Gant, NP      .  alum & mag hydroxide-simeth (MAALOX/MYLANTA) 200-200-20 MG/5ML suspension 30 mL  30 mL Oral PRN Delfin Gant, NP      . DULoxetine (CYMBALTA) DR capsule 60 mg  60 mg Oral BID Jenne Campus, MD   60 mg at 01/15/14 1630  . ibuprofen (ADVIL,MOTRIN) tablet 600 mg  600 mg Oral Q8H PRN  Delfin Gant, NP   600 mg at 01/15/14 1631  . Influenza vac split quadrivalent PF (FLUARIX) injection 0.5 mL  0.5 mL Intramuscular Tomorrow-1000 Nicholaus Bloom, MD   0.5 mL at 01/14/14 1000  . LORazepam (ATIVAN) tablet 0.5 mg  0.5 mg Oral TID Jenne Campus, MD   0.5 mg at 01/15/14 1630  . LORazepam (ATIVAN) tablet 0.5 mg  0.5 mg Oral Q8H PRN Jenne Campus, MD   0.5 mg at 01/15/14 1342  . nicotine (NICODERM CQ - dosed in mg/24 hours) patch 21 mg  21 mg Transdermal Daily Delfin Gant, NP   21 mg at 01/14/14 0800  . OLANZapine (ZYPREXA) tablet 5 mg  5 mg Oral QHS Encarnacion Slates, NP      . OXcarbazepine (TRILEPTAL) tablet 150 mg  150 mg Oral BID Jenne Campus, MD   150 mg at 01/15/14 1630  . pneumococcal 23 valent vaccine (PNU-IMMUNE) injection 0.5 mL  0.5 mL Intramuscular Tomorrow-1000 Nicholaus Bloom, MD   0.5 mL at 01/14/14 1000    Lab Results:  Results for orders placed or performed during the hospital encounter of 01/13/14 (from the past 48 hour(s))  TSH     Status: None   Collection Time: 01/15/14  6:15 AM  Result Value Ref Range   TSH 2.704 0.350 - 4.500 uIU/mL    Comment: Performed at [facility]    Physical Findings: AIMS: Facial and Oral Movements Muscles of Facial Expression: None, normal Lips and Perioral Area: None, normal Jaw: None, normal Tongue: None, normal,Extremity Movements Upper (arms, wrists, hands, fingers): None, normal Lower (legs, knees, ankles, toes): None, normal, Trunk Movements Neck, shoulders, hips: None, normal, Overall Severity Severity of abnormal movements (highest score from questions above): None, normal Incapacitation due to abnormal movements: None, normal Patient's awareness of abnormal movements (rate only patient's report): No Awareness, Dental Status Current problems with teeth and/or dentures?: No Does patient usually wear dentures?: No  CIWA:  CIWA-Ar Total: 0 COWS:  COWS Total Score: 0   Assessment- at this time  patient is partially improved, and describes improving mood. She has reported hallucinations to staff, but is currently not internally preoccupied or endorsing any psychotic symptoms. Presentation , as per staff,   Tends to be dramatic/ somatically focused . Describes chronic dyspareunia/genital pain, at times difficulting urination. States she uses lidocaine topical cream and often needs to self cath. No current medication side effects endorsed.  Treatment Plan Summary: Daily contact with patient to assess and evaluate symptoms and progress in treatment Medication management See below  Plan: Continue inpatient treatment , milieu, support Cymbalta 60 mgrs BID Ativan 0.5 mgrs TID Trileptal 150 mgrs BID Zyprexa 5 mgrs QHS  Medical Decision Making Problem Points:  Established problem, stable/improving (1), Review of last therapy session (1) and Review of psycho-social stressors (1) Data Points:  Review or order clinical lab tests (1) Review of medication regiment & side effects (2)  I certify that inpatient services furnished can reasonably be expected to improve the patient's condition.   Luqman Perrelli, Felicita Gage 01/15/2014, 5:57 PM

## 2014-01-15 NOTE — Progress Notes (Signed)
The focus of this group is to help patients review their daily goal of treatment and discuss progress on daily workbooks. Pt attended the evening group session and responded to all discussion prompts from the Writer. Pt shared that today was a great day, that she was feeling a "positive outlook" on her situation. Pt told the group that she gained more courage with every day here and was going to do well upon leaving. Vikki PortsValerie also mentioned that she felt she often experienced manic, creative highs from which she would inevitably crash. "My goal once I leave here is to level off so that I don't crash." Pt's affect was appropriate.

## 2014-01-15 NOTE — Progress Notes (Signed)
Patient awake and writing in her journal in her room, tonight.  Patient bizarre and stating voices are screaming at her in her head.  Patient preoccupied and has to be redirected.  Patient states she is scared to go in her room and states she is going to to stand in the hallway for the rest of the night.

## 2014-01-15 NOTE — Tx Team (Signed)
Interdisciplinary Treatment Plan Update   Date Reviewed:  01/15/2014  Time Reviewed:  9:09 AM  Progress in Treatment:   Attending groups: Yes Participating in groups: Yes, patient attends group and attempts to participates but becomes very emotional Taking medication as prescribed: Yes  Tolerating medication: Yes Family/Significant other contact made:  No, but will ask patient for consent for collateral contact Patient understands diagnosis: Yes  Discussing patient identified problems/goals with staff: Yes Medical problems stabilized or resolved: Yes Denies suicidal/homicidal ideation: Yes Patient has not harmed self or others: Yes  For review of initial/current patient goals, please see plan of care.  Estimated Length of Stay:  3-5 days  Reasons for Continued Hospitalization:  Anxiety Depression Medication stabilization   New Problems/Goals identified:    Discharge Plan or Barriers:   [facility] with [facility]  Additional Comments:    53 year old female, who has been diagnosed with Bipolar Disorder in the past. She has been feeling depressed " for a long time" and recently was feeling more so. After a conversation with her daughter, where she was told she would not be able to see her grandson any more, she developed suicidal ideations. She overdosed on opiates after writing a good bye letter to her family. Patient states she does not remember how she got to hospital. States she has been experiencing hallucinations ( auditory) for years, but they have been bothering her more recently. She also describes neuro-vegetative symptoms of depression as below.  Patient and CSW reviewed patient's identified goals and treatment plan.  Patient verbalized understanding and agreed to treatment plan.   Attendees:  Patient:  01/15/2014 9:09 AM   Signature:  Sallyanne HaversF. Cobos, MD 01/15/2014 9:09 AM  Signature: Geoffery LyonsIrving Lugo, MD 01/15/2014 9:09 AM  Signature:  Harold Barbanonecia Byrd, RN  01/15/2014 9:09 AM  Signature: Serena ColonelAggie Nwoko, NP 01/15/2014 9:09 AM  Signature:  Liborio NixonPatrice White, RN 01/15/2014 9:09 AM  Signature:  Juline PatchQuylle Lyman Balingit, LCSW 01/15/2014 9:09 AM  Signature:  Belenda CruiseKristin Drinkard, LCSW-A 01/15/2014 9:09 AM  Signature:  Leisa LenzValerie Enoch, Care Coordinator [facility] 01/15/2014 9:09 AM  Signature:   01/15/2014 9:09 AM  Signature: 01/15/2014  9:09 AM  Signature:   Onnie BoerJennifer Clark, RN [facility] 01/15/2014  9:09 AM  Signature:   01/15/2014  9:09 AM    Scribe for Treatment Team:   Juline PatchQuylle Giannina Bartolome,  01/15/2014 9:09 AM

## 2014-01-15 NOTE — [facility] Group Notes (Signed)
[facility] LCSW Group Therapy  Feelings Around Diagnosis  01/15/2014 2:42 PM  Type of Therapy:  Group Therapy  Participation Level:  Minimal  Participation Quality:  Redirectable  Affect:  Excited  Cognitive:  Disorganized  Insight:  Developing/Improving  Engagement in Therapy:  Developing/Improving  Modes of Intervention:  Discussion, Education, Exploration, Limit-setting, Problem-solving, Rapport Building and Support  Summary of Progress/Problems:  Patient attempt to participate in group but had to be redirected as she begin to talk about her other personalities.  Patient left group to talk with RN about medication problems.  [name], Vicki Thomas 01/15/2014, 2:42 PM

## 2014-01-15 NOTE — Progress Notes (Addendum)
Patient ID: [name], female   DOB: 08/27/1960, 53 y.o.   MRN: [id number] Pt is awake and active on the unit this afternoon. Pt mood and affect are anxious. Pt is requesting anxiety medication and would also like her Zyprexa increased at bedtime. She c/o seeing ants crawling in her, but she dose not seem to be responding to internal stimuli. Writer discussed psychosis with provider and dose will be increased. Pt needs frequent redirection but is cooperative with staff. Will continue to monitor. Pt continues to request topical lidocaine for genital pain. Provider is aware of pt complaints.

## 2014-01-15 NOTE — [facility] Suicide Risk Assessment (Signed)
[facility] INPATIENT:  Family/Significant Other Suicide Prevention Education  Suicide Prevention Education:  Education Completed; Vicki Thomas, [phone];  has been identified by the patient as the family member/significant other with whom the patient will be residing, and identified as the person(s) who will aid the patient in the event of a mental health crisis (suicidal ideations/suicide attempt).  With written consent from the patient, the family member/significant other has been provided the following suicide prevention education, prior to the and/or following the discharge of the patient.  The suicide prevention education provided includes the following:  Suicide risk factors  Suicide prevention and interventions  National Suicide Hotline telephone number  [facility] assessment telephone number  [facility] Emergency Assistance 911  [facility] and/or [facility]idential Mobile Crisis [facility] telephone number  Request made of family/significant other to:  Remove weapons (e.g., guns, rifles, knives), all items previously/currently identified as safety concern.  Sister advised patient does not have access to weapons.     Remove drugs/medications (over-the-counter, prescriptions, illicit drugs), all items previously/currently identified as a safety concern.  The family member/significant other verbalizes understanding of the suicide prevention education information provided.  The family member/significant other agrees to remove the items of safety concern listed above.  Vicki Thomas 01/15/2014, 1:17 PM

## 2014-01-15 NOTE — Clinical Social Work Note (Addendum)
CSW spoke with patient's friend Vicki Thomas, [phone].  Vicki Thomas advised she talks with patient daily [place].  She was recommending patient have a sitter to be in the home with her at discharge.  Patient has also requested she a Comptrollersitter.  She was informed we are not able to refer her for sitter services at discharge.  She was advised CSW spoke with sister and sister stated she will be available to help patient at discharge.  CSW consulted with Lead CSW and another CSW and was advised correct information given to patient and her friend.  CSW also spoke with sister, Vicki Thomas, [phone], to provide SPE.  Sister was also contacted following conversation with Vicki Thomas to advise her of being unable to arrange a sister for patient.

## 2014-01-15 NOTE — Progress Notes (Signed)
Patient ID: [name], female   DOB: 11/01/1960, 53 y.o.   MRN: [id number] D: Patient alert and cooperative. Pt reports she feels manic and needs medication. Pt reports she is having back pain because she is not able to self cath. Pt report she has been able to urinate but it takes a long time. Pt reports she is not able to void if she is anxious. Pt mood/affect is anxious. Pt denies SI/HI/AVH.  A: Medications administered as prescribed. Emotional support given and will continue to monitor pt's progress for stabilization.  R: Patient is safe and complaint with medications. Pt reports she was able to void this evening. Pt attended evening wrap up group this evening and engaged in discussion.

## 2014-01-15 NOTE — Progress Notes (Signed)
Pt attended spiritual care group on grief and loss facilitated by chaplain Burnis KingfisherMatthew Ronav Furney and counseling intern [place] Austin. Group opened with brief discussion and psycho-social ed around grief and loss in relationships and in relation to self - identifying life patterns, circumstances, changes that cause losses. Established group norm of speaking from own life experience. Group goal of establishing open and affirming space for members to share loss and experience with grief, normalize grief experience and provide psycho social education and grief support. Group drew on narrative and Alderian therapeutic modalities.   Vikki PortsValerie began to share grief around loss of her mother, and how this loss is tied to tensions in relationship with her sister. Her account around grief with her mother's death was somewhat tangential. Described elements of tension with her sister, surrealness of grief, and feeling overwhelmed. Vikki PortsValerie stated there are "vocies" when she is dealing with grief. She was quickly able to comfort another group member, but was unable to relate the same comfort to her own experiences. During the first part of group, she was somewhat intrusive and redirected conversation back to her own experiences; however, she did state she was aware of "taking the floor." Telecia left group while another member was sharing and did not return.  [place] Austin Counseling Intern

## 2014-01-16 MED ORDER — DULOXETINE HCL 60 MG PO CPEP
60.0000 mg | ORAL_CAPSULE | Freq: Every day | ORAL | Status: DC
  Administered 2014-01-17 – 2014-01-20 (×4): 60 mg via ORAL
  Filled 2014-01-16: qty 1
  Filled 2014-01-16: qty 5
  Filled 2014-01-16 (×4): qty 1

## 2014-01-16 MED ORDER — LIDOCAINE 5 % EX OINT
TOPICAL_OINTMENT | Freq: Two times a day (BID) | CUTANEOUS | Status: DC | PRN
  Administered 2014-01-16 – 2014-01-18 (×4): via TOPICAL

## 2014-01-16 MED ORDER — BISACODYL 5 MG PO TBEC: 5.0000 mg | DELAYED_RELEASE_TABLET | Freq: Every day | ORAL | Status: DC | PRN

## 2014-01-16 MED ORDER — TRAZODONE HCL 50 MG PO TABS
50.0000 mg | ORAL_TABLET | Freq: Every evening | ORAL | Status: DC | PRN
  Administered 2014-01-16 – 2014-01-19 (×5): 50 mg via ORAL
  Filled 2014-01-16 (×5): qty 1
  Filled 2014-01-16: qty 10
  Filled 2014-01-16 (×2): qty 1
  Filled 2014-01-16: qty 10
  Filled 2014-01-16 (×4): qty 1

## 2014-01-16 NOTE — Progress Notes (Signed)
Adult Psychoeducational Group Note  Date:  01/16/2014 Time:  9:14 PM  Group Topic/Focus:  Wrap-Up Group:   The focus of this group is to help patients review their daily goal of treatment and discuss progress on daily workbooks.  Participation Level:  Active  Participation Quality:  Appropriate  Affect:  Labile  Cognitive:  Oriented  Insight: Appropriate  Engagement in Group:  Engaged  Modes of Intervention:  Socialization and Support  Additional Comments:  Patient attended and participated in group tonight.She reports that her day has been up and down. She had some pain issues today but she has been feeling better. [facility]. She has been Database administratorlearning cooping skills and today she received information from the nurse that was helpful. The patient advised that upon discharge [facility]. She consider herself a good friend because she sees herself as open-minded and kind.  Lita MainsFrancis, Beatriz Settles [facility] 01/16/2014, 9:14 PM

## 2014-01-16 NOTE — [facility] Group Notes (Signed)
[facility] Mental Health Association Group Therapy  01/16/2014 , 1:20 PM    Type of Therapy:  Mental Health Association Presentation  Participation Level:  Active  Participation Quality:  Attentive  Affect:  Blunted  Cognitive:  Oriented  Insight:  Limited  Engagement in Therapy:  Engaged  Modes of Intervention:  Discussion, Education and Socialization  Summary of Progress/Problems: CSW provided information about the Mental Health Association support groups and peer support resources available. AA/NA meeting list and IRC (interactive resource center) handout also provided and patients were invited to share their experiences with these community resources. Group members were then invited to participate in a group activity where they picked questions randomly out of a cup and shared their response for the group for processing. Vicki Thomas was attentive and engaged during today's processing group. She shared that she is unsure if she would be a good friend because she has "trust issues." "but I would do anyting for someone else. I would give you the shirt off my back." Others shared their opinions of Vicki Thomas as a friend. Vicki Thomas provided emotional and positive support to other group members when they talked about their issues. She is pleasant and supportive to others in the group setting and appears to be oriented with organized thought process and improving insight. She also shared a cherished childhood memory involving her grandmother who was also diagnosed with a mental illness.   The Sherwin-WilliamsHeather Smart, LCSWA  01/16/2014 , 1:20 PM

## 2014-01-16 NOTE — Plan of Care (Signed)
Problem: Ineffective individual coping Goal: STG: Patient will remain free from self harm Outcome: Progressing Pt is safe and is free from self harm

## 2014-01-16 NOTE — Progress Notes (Signed)
Patient ID: Vicki SchwartzValerie Thomas, female   DOB: 09/12/1960, 53 y.o.   MRN: [id number] [facility]  01/16/2014 11:53 AM Vicki Thomas  MRN:  [id number] Subjective:   Patient states "I have a routine I follow. I was not able to because I let my illness get too far gone. Now I am worried people will tell my pain Doctor to stop prescribing for me. I want to be productive. Even my dog gets depressed because of my illness. I think I may be starting to get manic. I have superhuman energy. I could just start singing. I get all activated. I am worried that my legs start swelling after my Zyprexa dose was increased. I have heard people talking about that medication and it worried me."   Objective:  Patient is seen and chart is reviewed. She is very anxious to speak with Provider this morning. Patient has new concerns about her medication. Her speech is noted to be pressured with flight of ideas. She changes the topic frequently during conversation. Evaluated patient's legs which appear to have mild trace edema. No pitting noted. Patient stated "I just noticed it this morning when I got up. It got better when I elevated them." Denies having any current psychosis but states "It will get to that point." Prior notes from MD indicate that the patient has been getting better. The patient reports a long history of being hypersensitive to medications. She became focused on telling writer a story about how the medication Latuda made her "head explode." Review of her medication allergies indicates multiple reported reactions to antipsychotic medications. Spoke with Dr. Jama Flavorsobos to discuss patient's case due to her somatic reports. Dr. Jama Flavorsobos recommended decreasing the Cymbalta to 60 mg daily and continuing the Zyprexa at 5 mg hs.   Diagnosis:  Bipolar Disorder 1 Disorder, most recent episode depressed, PTSD per history.  Total Time spent with patient: 30 minutes  ADL's:  Improving   Sleep: fair   Appetite:   fair  Suicidal Ideation:  At this time denies any suicidal ideations , and contracts for safety on unit Homicidal Ideation:  Denies  AEB (as evidenced by):  Psychiatric Specialty Exam: Physical Exam  Review of Systems  Constitutional: Negative for fever, chills, weight loss, malaise/fatigue and diaphoresis.  HENT: Negative for congestion, ear discharge, ear pain, hearing loss, nosebleeds and tinnitus.   Eyes: Negative for blurred vision, double vision, photophobia, pain, discharge and redness.  Respiratory: Negative for cough, hemoptysis, sputum production, shortness of breath, wheezing and stridor.   Cardiovascular: Positive for leg swelling. Negative for chest pain, palpitations, orthopnea, claudication and PND.  Gastrointestinal: Negative for heartburn, nausea, vomiting, abdominal pain, diarrhea, constipation, blood in stool and melena.  Genitourinary:       Chronic genital pain reported   Musculoskeletal: Positive for myalgias and back pain.  Skin: Negative for itching and rash.  Neurological: Negative for dizziness, tingling, tremors, sensory change, speech change, focal weakness, seizures, loss of consciousness, weakness and headaches.  Endo/Heme/Allergies: Negative for environmental allergies and polydipsia. Does not bruise/bleed easily.  Psychiatric/Behavioral: Positive for depression. The patient is nervous/anxious and has insomnia.     Blood pressure 104/54, pulse 68, temperature 98.4 F (36.9 C), temperature source Oral, resp. rate 20, height 5\' 8"  (1.727 m), weight 72.576 kg (160 lb), SpO2 99 %.Body mass index is 24.33 kg/(m^2).  General Appearance: Fairly Groomed  Patent attorneyye Contact::  improved  Speech:  Normal Rate  Volume:  Decreased  Mood:  Anxious and Euphoric  Affect:  Full Range  Thought Process:  linear overall , becomes somewhat tangential with open ended questions  Orientation:  Other:  fully alert and attentive  Thought Content:  intermittent hallucinations, not  internally preoccupied at this time. No dellusions expressed, somatically  focused  Suicidal Thoughts:  No at this time denies any thoughts of hurting self and  contracts for safety on unit   Homicidal Thoughts:  No  Memory:  Recent and remote fair   Judgement:  Fair  Insight:  Fair  Psychomotor Activity:  Increased  Concentration:  Fair  Recall:  Good  Fund of Knowledge:Good  Language: Good  Akathisia:  Negative  Handed:  Right  AIMS (if indicated):     Assets:  Desire for Improvement Resilience  Sleep:  Number of Hours: 4.75   Musculoskeletal: Strength & Muscle Tone: within normal limits Gait & Station: normal Patient leans: N/A  Current Medications: Current Facility-Administered Medications  Medication Dose Route Frequency Provider Last Rate Last Dose  . acetaminophen (TYLENOL) tablet 650 mg  650 mg Oral Q4H PRN Earney NavyJosephine C Onuoha, NP      . alum & mag hydroxide-simeth (MAALOX/MYLANTA) 200-200-20 MG/5ML suspension 30 mL  30 mL Oral PRN Earney NavyJosephine C Onuoha, NP      . DULoxetine (CYMBALTA) DR capsule 60 mg  60 mg Oral BID Craige CottaFernando A Cobos, MD   60 mg at 01/16/14 0813  . ibuprofen (ADVIL,MOTRIN) tablet 600 mg  600 mg Oral Q8H PRN Earney NavyJosephine C Onuoha, NP   600 mg at 01/15/14 1631  . Influenza vac split quadrivalent PF (FLUARIX) injection 0.5 mL  0.5 mL Intramuscular Tomorrow-1000 Rachael FeeIrving A Lugo, MD   0.5 mL at 01/14/14 1000  . lidocaine (XYLOCAINE) 5 % ointment   Topical BID PRN Craige CottaFernando A Cobos, MD      . LORazepam (ATIVAN) tablet 0.5 mg  0.5 mg Oral TID Craige CottaFernando A Cobos, MD   0.5 mg at 01/16/14 1128  . LORazepam (ATIVAN) tablet 0.5 mg  0.5 mg Oral Q8H PRN Craige CottaFernando A Cobos, MD   0.5 mg at 01/15/14 2207  . nicotine (NICODERM CQ - dosed in mg/24 hours) patch 21 mg  21 mg Transdermal Daily Earney NavyJosephine C Onuoha, NP   21 mg at 01/14/14 0800  . OLANZapine (ZYPREXA) tablet 5 mg  5 mg Oral QHS Sanjuana KavaAgnes I Nwoko, NP   5 mg at 01/15/14 2207  . OXcarbazepine (TRILEPTAL) tablet 150 mg  150 mg Oral BID  Craige CottaFernando A Cobos, MD   150 mg at 01/16/14 0813  . pneumococcal 23 valent vaccine (PNU-IMMUNE) injection 0.5 mL  0.5 mL Intramuscular Tomorrow-1000 Rachael FeeIrving A Lugo, MD   0.5 mL at 01/14/14 1000    Lab Results:  Results for orders placed or performed during the hospital encounter of 01/13/14 (from the past 48 hour(s))  TSH     Status: None   Collection Time: 01/15/14  6:15 AM  Result Value Ref Range   TSH 2.704 0.350 - 4.500 uIU/mL    Comment: [facility]    Physical Findings: AIMS: Facial and Oral Movements Muscles of Facial Expression: None, normal Lips and Perioral Area: None, normal Jaw: None, normal Tongue: None, normal,Extremity Movements Upper (arms, wrists, hands, fingers): None, normal Lower (legs, knees, ankles, toes): None, normal, Trunk Movements Neck, shoulders, hips: None, normal, Overall Severity Severity of abnormal movements (highest score from questions above): None, normal Incapacitation due to abnormal movements: None, normal Patient's awareness of abnormal movements (rate only patient's  report): No Awareness, Dental Status Current problems with teeth and/or dentures?: No Does patient usually wear dentures?: No  CIWA:  CIWA-Ar Total: 0 COWS:  COWS Total Score: 0    Treatment Plan Summary: Daily contact with patient to assess and evaluate symptoms and progress in treatment Medication management See below  Plan: 1. Continue inpatient treatment , milieu, support 2. Decrease Cymbalta to 60 mg daily due to possible reaction of hypomania  3. Ativan 0.5 mg TID for anxiety 4. Continue Trileptal 150 mgrs BID for improved mood stability 5. Continue Zyprexa 5 mgrs QHS for mood stabilization 6. Placed order for patient to use Lidocaine cream from home for chronic genital pain.  Medical Decision Making Problem Points:  Established problem, stable/improving (1), Review of last therapy session (1) and Review of psycho-social stressors (1) Data  Points:  Review or order clinical lab tests (1) Review of medication regiment & side effects (2)  I certify that inpatient services furnished can reasonably be expected to improve the patient's condition.   Fransisca Kaufmann NP-C 01/16/2014, 11:53 AM   Patient reviewed with me as above Nehemiah Massed , MD

## 2014-01-16 NOTE — Plan of Care (Signed)
Problem: Alteration in mood Goal: LTG-Patient reports reduction in suicidal thoughts (Patient reports reduction in suicidal thoughts and is able to verbalize a safety plan for whenever patient is feeling suicidal)  Outcome: Progressing Patient denies suicidal thoughts today.  Problem: Diagnosis: Increased Risk For Suicide Attempt Goal: STG-Patient Will Comply With Medication Regime Outcome: Progressing Patient has complied with medication regimen today.  Problem: Ineffective individual coping Goal: STG: Patient will remain free from self harm Outcome: Progressing Patient remains free from self harm. 15 minute checks completed per protocol for patient safety.   Comments:  Alena BillsGuthrie, Duard Spiewak A, RN

## 2014-01-16 NOTE — [facility] Group Notes (Signed)
[facility] Dis[facility] Planning [facility] Note   01/16/2014 11:32 AM  Participation Quality:  Called out by MD prior to checking in with CSW. CSW to follow-up with pt after group.   Smart, American FinancialHeather LCSWA

## 2014-01-16 NOTE — Progress Notes (Signed)
D: Patient is alert and oriented. Pt's mood and affect is euphoric, animated, and pleasant during interaction, pt is tearful at times. Pt expressing several somatic complaints. Pt's eye contact is fair. Pt's speech is tangential. Pt rates her depression and hopelessness 0/10 and anxiety 3/10. Pt states "I feel a little manic." Pt reports her goal for the day is "learning some more coping skills and getting my zyprexa to work for me so I can go home." Pt reports she plans to reach her goal by "take one step at a time." Pt continues to express concerns that she needs to "self cath" however reports that she has voided twice this morning. 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 Pt reports she has mentioned it "to everyone" with [facility]. Pt reports desire to purge and states "I used to be bulimic but I've never told anyone, you're the first person I've told." Pt has bilateral non-pitting edema in both lower extremities with some slight pain with touch and a little redness. Pt reports decrease swelling and no pain post elevation. Pt is attending groups. Pt denies SI/HI and AH. Pt reports VH of a "shadow" that is frightening her. At 1400 pt complains of extreme nausea and anxiety. Pt requests water and states "I dont want to take my anxiety medication yet because I might throw it up." Pt reports she is going to drink water and try to "calm down" then may want her PRN medication. Pt request informational hand outs to read. A: Pt encouraged to elevate legs. RN consulted with NP, Vernona RiegerLaura in regards to edema, will reassess frequently. Home med obtained and new orders acknowledged for vulva pain.  Pt encouraged to sit in the day room after meals to help reduce urge to purge. Active listening. Encouragement/Support provided to pt.  Pt given mental health wellness and anxiety reducing tips informational hand outs. PRN medication administered for pain and anxiety  per providers orders (See MAR). Scheduled medications administered per providers orders (See MAR). 15 minute checks completed per protocol for patient safety.  R: Pt cooperative and receptive to nursing interventions.

## 2014-01-17 DIAGNOSIS — F4310 Post-traumatic stress disorder, unspecified: Secondary | ICD-10-CM

## 2014-01-17 DIAGNOSIS — F3130 Bipolar disorder, current episode depressed, mild or moderate severity, unspecified: Secondary | ICD-10-CM

## 2014-01-17 LAB — 10-HYDROXYCARBAZEPINE: Triliptal/MTB(Oxcarbazepin): 1.6 ug/mL — ABNORMAL LOW (ref 8.0–35.0)

## 2014-01-17 MED ORDER — IBUPROFEN 600 MG PO TABS
600.0000 mg | ORAL_TABLET | Freq: Three times a day (TID) | ORAL | Status: DC | PRN
  Administered 2014-01-17 – 2014-01-20 (×6): 600 mg via ORAL
  Filled 2014-01-17 (×6): qty 1

## 2014-01-17 NOTE — Plan of Care (Signed)
Problem: Alteration in mood Goal: LTG-Patient reports reduction in suicidal thoughts (Patient reports reduction in suicidal thoughts and is able to verbalize a safety plan for whenever patient is feeling suicidal)  Outcome: Not Progressing Pt denies SI/HI at this time Goal: STG-Patient is able to discuss feelings and issues (Patient is able to discuss feelings and issues leading to depression)  Outcome: Progressing Pt informed writer it was doing for others and putting them before herself that partially led to her depression  Problem: Diagnosis: Increased Risk For Suicide Attempt Goal: STG-Patient Will Attend All Groups On The Unit Outcome: Progressing Pt attending groups AEB documentation Goal: STG-Patient Will Report Suicidal Feelings to Staff Outcome: Progressing Pt contracts for safety verbally

## 2014-01-17 NOTE — Progress Notes (Signed)
Pt upset that she had ECT in 2013 and stated she was upset she can't remeber taking care of her grandfather. Pt redirected and calmed down.

## 2014-01-17 NOTE — Progress Notes (Signed)
[facility] :  Will you be returning to the same living situation after discharge: Yes,  home At discharge, do you have transportation home?:Yes,  sister can pick her up after lunch on Saturday. (pt tentative d/c date of Saturday if stable.) Do you have the ability to pay for your medications:Yes,  [facility]/Medicaid  Release of information consent forms completed and submitted to medical Records by CSW.  Patient to Follow up at: Follow-up Information    [facility].   Why:  You are scheduled with Dr. Tenny Crawoss on Wednesday, February 06, 2013 at 11:30 AM. Please cancel this appt if you are accepted into Psych IOP. Wilson SinkRita will [facility].    Contact information:   [address] [place], [place]   [id number] [phone]      Follow up with [facility] On 01/31/2014.   Why:  Assessment/orientation with [name] for Psych IOP on this date. Please arrive at 8:30AM for appt with completed "new patient packet."    Contact information:   [address] [place], [place] [address] Phone: [phone] Fax: [phone]      Patient denies SI/HI:   Yes,  during group/self report    Safety Planning and Suicide Prevention discussed:  Yes,  SPE completed with pt's friend and pt was provided with SPI pamphlet, encouraged to share information with support network, ask questions, and talk about any concerns relating to SPE.  Smart, Kayla Deshaies LCSWA  01/17/2014, 12:32 PM

## 2014-01-17 NOTE — Progress Notes (Signed)
Patient ID: Vicki SchwartzValerie Thomas, female   DOB: 04/12/1960, 53 y.o.   MRN: [id number]   Vicki Thomas has had many pain complaints today -- in her hip, pelvic area and vulva, as well as numbness and tingling in her hands and feet. She has expressed a desire for access to lidocaine cream more than 2x daily. PRNs have been given when indicated, including ibuprofen and Tylenol at different intervals for pain. Emotional support provided as well. Vicki Thomas denies SI, HI, and AVH. She has posed no behavioral problems on unit. Will continue to monitor for needs and safety.

## 2014-01-17 NOTE — Progress Notes (Signed)
Vicki Thomas applied her PRN Lidocaine cream to her vulva. About 20 minutes later, she complained that she had urinated and the cream had come off. She wanted to apply cream again. Explained to her that the PRN frequency would need to be discussed with her provider. She said that her pain (8 to hip and pelvic area) is preventing her from dealing with her anxiety. She also said that she wants to be discharged soon because she has outside matters to deal with. She denied SI, HI, and a/v disturbances.Will continue to monitor for needs and safety.

## 2014-01-17 NOTE — Tx Team (Signed)
Initial Interdisciplinary Treatment Plan   PATIENT STRESSORS: Medication change or noncompliance   PATIENT STRENGTHS: General fund of knowledge Motivation for treatment/growth   PROBLEM LIST: Problem List/Patient Goals Date to be addressed Date deferred Reason deferred Estimated date of resolution  Risk for SI 01/17/14     Bipolar 01/17/14     "want pain to go away, work on bi-polar" 01/17/14                                          DISCHARGE CRITERIA:  Ability to meet basic life and health needs Motivation to continue treatment in a less acute level of care  PRELIMINARY DISCHARGE PLAN: Attend aftercare/continuing care group Attend PHP/IOP Outpatient therapy  PATIENT/FAMIILY INVOLVEMENT: This treatment plan has been presented to and reviewed with the patient, Vicki Thomas.  The patient and family have been given the opportunity to ask questions and make suggestions.  Jacques Navyhillips, Haruna Rohlfs A 01/17/2014, 1:39 AM

## 2014-01-17 NOTE — B[facility] Group Notes (Signed)
[facility] Aftercare Discharge Planning [facility] Note   01/17/2014 11:13 AM  Participation Quality:  Appropriate   Mood/Affect:  Appropriate  Depression Rating:  0  Anxiety Rating:  0  Thoughts of Suicide:  No Will you contract for safety?   NA  Current AVH:  No  Plan for Discharge/Comments:  Pt reports that she is ready to d/c "hopefully by Sat." she reports no anxiety or depression; "just alittle pain because of the rain." Pt presents as clear with organized thought process. Pt plans to see [name] for psych IOP assessment/orientation on Jan 7th. Pt has appt with Dr. Tenny Crawoss that she may chose to cancel due to "not liking how she doesn't spend enough time with me."   Transportation Means: sister can pick her up anytime anyday.   Supports: sister and best friend   Counselling psychologistmart, OncologistHeather LCSWA

## 2014-01-17 NOTE — Progress Notes (Addendum)
Pt appears to behave with Histrionic type actions. Pt woke up screaming complaining she was in pain and staling she had a bad dream. Pt was sitting on the floor, pt stated she did not fall. Pt stated she does this every morning. Pt was assisted to the bathroom, pt had a steady gait, pt appeared stable, and pt stated she was ok.

## 2014-01-17 NOTE — Progress Notes (Signed)
Pt did not attend karaoke this evening.  

## 2014-01-17 NOTE — Progress Notes (Signed)
D: Pt denies SI/HI/AVH. Pt is pleasant and cooperative. Pt continues to complain of pain all over her body. Pt appears to be in denial about her situation. Pt stated she understands the importance of getting herself right. Pt said she always put other people first, now its time to put herself first. Pt also stated she still has to deal with the death of her mother 10/2012.   A: Pt was offered support and encouragement. Pt was given scheduled medications. Pt was encourage to attend groups. Q 15 minute checks were done for safety. Pt given advil per PRN protocol for generalized pain.   R:Pt attends groups and interacts well with peers and staff. Pt is taking medication. Pt has no complaints at this time .Pt receptive to treatment and safety maintained on unit.

## 2014-01-17 NOTE — Plan of Care (Signed)
Problem: Alteration in mood Goal: LTG-Patient reports reduction in suicidal thoughts (Patient reports reduction in suicidal thoughts and is able to verbalize a safety plan for whenever patient is feeling suicidal)  Outcome: Progressing Pt denies SI  Problem: Ineffective individual coping Goal: LTG: Patient will report a decrease in negative feelings Outcome: Progressing Pt stated she had decreased anxiety today

## 2014-01-17 NOTE — Progress Notes (Signed)
D: Pt denies SI/HI/AVH. Pt is pleasant and cooperative. Pt gets very tangential, and labile at times. Pt is very redirectable. Pt stated she had improved today. Pt endorsed much less anxiety and worrying today.   A: Pt was offered support and encouragement. Pt was given scheduled medications. Pt was encourage to attend groups. Q 15 minute checks were done for safety.   R:Pt attends groups and interacts well with peers and staff. Pt is taking medication.Pt receptive to treatment and safety maintained on unit.

## 2014-01-17 NOTE — BHH Group Notes (Signed)
The focus of this group is to educate the patient on the purpose and policies of crisis stabilization and provide a format to answer questions about their admission.  The group details unit policies and expectations of patients while admitted.  Patient did not attend 0900 nurse education orientation group this morning.  Patient stayed in his room. 

## 2014-01-17 NOTE — Progress Notes (Signed)
[facility] MD Progress Note  01/17/2014 11:26 AM Vicki Thomas  MRN:  [id number] Subjective:   Patient states the Zyprexa is helping. She is having some leg swelling but it is tolerable and better than before. Sleep is good and she plans to continue this routine at home. States she wants to go home by Saturday. Pt has an appt on Jan 7 and wants to use the week to get caught up at home.  Depression is improving but was a little bad this morning. Pt had nightmares last night. Pt felt better after socializing. States being here is helping. Denies SI/HI, AVH.   PTSD- nightmares and triggers with smells.   Objective:  Patient is seen and chart is reviewed. Pt has been attending groups and is visible on the unit. Evaluated patient's legs which appear to have edema. Mild pitting noted. The patient reports a long history of being hypersensitive to medications.  Review of her medication allergies indicates multiple reported reactions to antipsychotic medications.  Diagnosis:  Bipolar Disorder 1 Disorder, most recent episode depressed, PTSD per history.  Total Time spent with patient: 30 minutes  ADL's:  Improving   Sleep: fair   Appetite:  fair  Suicidal Ideation:  At this time denies any suicidal ideations , and contracts for safety on unit Homicidal Ideation:  Denies  AEB (as evidenced by):  Psychiatric Specialty Exam: Physical Exam  Review of Systems  Constitutional: Negative for fever, chills, weight loss, malaise/fatigue and diaphoresis.  HENT: Negative for congestion, ear discharge, ear pain, hearing loss, nosebleeds and tinnitus.   Eyes: Negative for blurred vision, double vision, photophobia, pain, discharge and redness.  Respiratory: Negative for cough, hemoptysis, sputum production, shortness of breath, wheezing and stridor.   Cardiovascular: Positive for leg swelling. Negative for chest pain, palpitations, orthopnea, claudication and PND.  Gastrointestinal: Negative for heartburn,  nausea, vomiting, abdominal pain, diarrhea, constipation, blood in stool and melena.  Genitourinary:       Chronic genital pain reported   Musculoskeletal: Positive for myalgias and back pain.  Skin: Negative for itching and rash.  Neurological: Negative for dizziness, tingling, tremors, sensory change, speech change, focal weakness, seizures, loss of consciousness, weakness and headaches.  Endo/Heme/Allergies: Negative for environmental allergies and polydipsia. Does not bruise/bleed easily.  Psychiatric/Behavioral: Positive for depression. The patient is nervous/anxious. The patient does not have insomnia.     Blood pressure 143/55, pulse 89, temperature 98.2 F (36.8 C), temperature source Oral, resp. rate 20, height 5\' 8"  (1.727 m), weight 72.576 kg (160 lb), SpO2 99 %.Body mass index is 24.33 kg/(m^2).  General Appearance: Fairly Groomed  Patent attorney::  improved  Speech:  Normal Rate  Volume:  Normal  Mood:  Anxious and Euphoric  Affect:  Full Range  Thought Process:  Intact, Linear and Logical  Orientation:  Other:  fully alert and attentive  Thought Content:  not internally preoccupied at this time. No dellusions expressed, somatically  focused  Suicidal Thoughts:  No at this time denies any thoughts of hurting self and  contracts for safety on unit   Homicidal Thoughts:  No  Memory:  Recent and remote fair   Judgement:  Fair  Insight:  Fair  Psychomotor Activity:  Increased  Concentration:  Fair  Recall:  Good  Fund of Knowledge:Good  Language: Good  Akathisia:  Negative  Handed:  Right  AIMS (if indicated):     Assets:  Desire for Improvement Resilience  Sleep:  Number of Hours: 5   Musculoskeletal: Strength &  Muscle Tone: within normal limits Gait & Station: normal Patient leans: N/A  Current Medications: Current Facility-Administered Medications  Medication Dose Route Frequency Provider Last Rate Last Dose  . acetaminophen (TYLENOL) tablet 650 mg  650 mg Oral  Q4H PRN Earney NavyJosephine C Onuoha, NP   650 mg at 01/16/14 1410  . alum & mag hydroxide-simeth (MAALOX/MYLANTA) 200-200-20 MG/5ML suspension 30 mL  30 mL Oral PRN Earney NavyJosephine C Onuoha, NP      . bisacodyl (DULCOLAX) EC tablet 5 mg  5 mg Oral Daily PRN Kerry HoughSpencer E Simon, PA-C      . DULoxetine (CYMBALTA) DR capsule 60 mg  60 mg Oral Daily Fransisca KaufmannLaura Davis, NP   60 mg at 01/17/14 0840  . ibuprofen (ADVIL,MOTRIN) tablet 600 mg  600 mg Oral Q8H PRN Kerry HoughSpencer E Simon, PA-C   600 mg at 01/17/14 [id number]  . Influenza vac split quadrivalent PF (FLUARIX) injection 0.5 mL  0.5 mL Intramuscular Tomorrow-1000 Rachael FeeIrving A Lugo, MD   0.5 mL at 01/14/14 1000  . lidocaine (XYLOCAINE) 5 % ointment   Topical BID PRN Craige CottaFernando A Cobos, MD      . LORazepam (ATIVAN) tablet 0.5 mg  0.5 mg Oral TID Craige CottaFernando A Cobos, MD   0.5 mg at 01/17/14 0841  . LORazepam (ATIVAN) tablet 0.5 mg  0.5 mg Oral Q8H PRN Craige CottaFernando A Cobos, MD   0.5 mg at 01/16/14 2314  . nicotine (NICODERM CQ - dosed in mg/24 hours) patch 21 mg  21 mg Transdermal Daily Earney NavyJosephine C Onuoha, NP   21 mg at 01/14/14 0800  . OLANZapine (ZYPREXA) tablet 5 mg  5 mg Oral QHS Sanjuana KavaAgnes I Nwoko, NP   5 mg at 01/16/14 2155  . OXcarbazepine (TRILEPTAL) tablet 150 mg  150 mg Oral BID Craige CottaFernando A Cobos, MD   150 mg at 01/17/14 0841  . pneumococcal 23 valent vaccine (PNU-IMMUNE) injection 0.5 mL  0.5 mL Intramuscular Tomorrow-1000 Rachael FeeIrving A Lugo, MD   0.5 mL at 01/14/14 1000  . traZODone (DESYREL) tablet 50 mg  50 mg Oral QHS,MR X 1 Spencer E Simon, PA-C   50 mg at 01/16/14 2314    Lab Results:  No results found for this or any previous visit (from the past 48 hour(s)).  Physical Findings: AIMS: Facial and Oral Movements Muscles of Facial Expression: None, normal Lips and Perioral Area: None, normal Jaw: None, normal Tongue: None, normal,Extremity Movements Upper (arms, wrists, hands, fingers): None, normal Lower (legs, knees, ankles, toes): None, normal, Trunk Movements Neck, shoulders, hips:  None, normal, Overall Severity Severity of abnormal movements (highest score from questions above): None, normal Incapacitation due to abnormal movements: None, normal Patient's awareness of abnormal movements (rate only patient's report): No Awareness, Dental Status Current problems with teeth and/or dentures?: No Does patient usually wear dentures?: No  CIWA:  CIWA-Ar Total: 0 COWS:  COWS Total Score: 0    Treatment Plan Summary: Daily contact with patient to assess and evaluate symptoms and progress in treatment Medication management See below  Plan: 1. Continue inpatient treatment , milieu, support 2. Cymbalta to 60 mg daily   3. Ativan 0.5 mg TID for anxiety 4. Continue Trileptal 150 mgrs BID for improved mood stability. Level 1.5 5. Continue Zyprexa 5 mgrs QHS for mood stabilization 6. Placed order for patient to use Lidocaine cream from home for chronic genital pain.  Medical Decision Making Problem Points:  Established problem, stable/improving (1), Review of last therapy session (1) and Review of psycho-social stressors (1)  Data Points:  Review or order clinical lab tests (1) Review of medication regiment & side effects (2)  I certify that inpatient services furnished can reasonably be expected to improve the patient's condition.   Oletta DarterAGARWAL, Jenasis Straley MD 01/17/2014, 11:26 AM

## 2014-01-18 MED ORDER — MUSCLE RUB 10-15 % EX CREA
TOPICAL_CREAM | CUTANEOUS | Status: DC | PRN
  Administered 2014-01-18: 21:00:00 via TOPICAL
  Filled 2014-01-18: qty 85

## 2014-01-18 MED ORDER — LIDOCAINE 5 % EX OINT: TOPICAL_OINTMENT | Freq: Three times a day (TID) | CUTANEOUS | Status: DC | PRN

## 2014-01-18 MED ORDER — HYDROXYZINE HCL 25 MG PO TABS
25.0000 mg | ORAL_TABLET | Freq: Three times a day (TID) | ORAL | Status: DC | PRN
  Administered 2014-01-18 – 2014-01-20 (×4): 25 mg via ORAL
  Filled 2014-01-18 (×4): qty 1

## 2014-01-18 MED ORDER — CAMPHOR-MENTHOL 0.5-0.5 % EX LOTN
TOPICAL_LOTION | CUTANEOUS | Status: DC | PRN
Start: 2014-01-18 — End: 2014-01-20
  Filled 2014-01-18: qty 222

## 2014-01-18 NOTE — Progress Notes (Signed)
D: Pt denies SI/HI/AVH. Pt is pleasant and cooperative. Pt continues to be very dramatic when talking. Pt goes from crying spells to self pity. Pt is redirectable. Pt complained to have constipation, but stated she can't take the Dulcolax ordered. Pt continues to exhibit Histrionic behaviors, over exaggerating every situation and or symptom. Pt is very redirectable and can be very needy, but pt redirects well.   A: Pt was offered support and encouragement. Pt was given scheduled medications. Pt was encourage to attend groups. Q 15 minute checks were done for safety. Pt was given prune juice to help bowels.   R:Pt attends groups and interacts well with peers and staff. Pt is taking medication.Pt receptive to treatment and safety maintained on unit.

## 2014-01-18 NOTE — Progress Notes (Signed)
D: Patient denies SI/HI and A/V hallucinations; patient reports sleep is good; reports appetite is good; reports energy level is normal ; reports ability to concentrate is good; rates depression as 0/10; rates hopelessness 0/10; patient has had complaints of anxiety and has asked if the doctor increased her ativan a few times; patient really wants to go home  A: Monitored q 15 minutes; patient encouraged to attend groups; patient educated about medications; patient given medications per physician orders; patient encouraged to express feelings and/or concerns  R: Patient requires frequent nursing intervention but pleasant; patient has frequent request but can be redirected; patient's interaction with staff and peers is minimal and mostly stays in her room and can get very anxious and very excited with to much stimulation; ; patient ; patient was able to set goal to talk with staff 1:1 when having feelings of SI; patient is taking medications as prescribed and tolerating medications

## 2014-01-18 NOTE — Progress Notes (Signed)
Patient ID: Vicki Thomas, female   DOB: Jan 09, 1961, 53 y.o.   MRN: [id number] [facility] MD Progress Note  01/18/2014 10:42 AM Vicki Thomas  MRN:  [id number] Subjective:   Patient states the Zyprexa is helping. She is having some leg swelling but it is decreased as compared to yesterday. No pitting edema and no redness noted. Mood is good and she is feeling positive. Denies depression. States she is using her coping skills. Sleep is poor due to nightmares. As far as her PTSD she is having nightmares and smells are triggers. States she has not grieved for her mother yet. States she wants to go home on Saturday. Pt has an appt on Jan 7 and wants to use the week to get caught up at home. States being here is helping. Denies SI/HI, AVH. Pt is talking to family for support.   Pt is having arthritic pain and pain in her vagina. Asking for methanol cream and increase in Lidocaine cream.   Objective:  Patient is seen and chart is reviewed. Pt has been attending groups and is visible on the unit. Evaluated patient's legs which appear to have edema. No pitting noted. The patient reports a long history of being hypersensitive to medications.  Review of her medication allergies indicates multiple reported reactions to antipsychotic medications. As been complaining of pain in various locations. Per nursing pt was tangential but redirectable. No disruptive behavior noted.   Diagnosis:  Bipolar Disorder 1 Disorder, most recent episode depressed, PTSD per history.  Total Time spent with patient: 30 minutes  ADL's:  Improving   Sleep: fair   Appetite:  good  Suicidal Ideation:  At this time denies any suicidal ideations , and contracts for safety on unit Homicidal Ideation:  Denies  AEB (as evidenced by):  Psychiatric Specialty Exam: Physical Exam  Review of Systems  Constitutional: Negative for fever, chills, weight loss, malaise/fatigue and diaphoresis.  HENT: Negative for congestion, ear discharge, ear  pain, hearing loss, nosebleeds and tinnitus.   Eyes: Negative for blurred vision, double vision, photophobia, pain, discharge and redness.  Respiratory: Negative for cough, hemoptysis, sputum production, shortness of breath, wheezing and stridor.   Cardiovascular: Positive for leg swelling. Negative for chest pain, palpitations, orthopnea, claudication and PND.  Gastrointestinal: Negative for heartburn, nausea, vomiting, abdominal pain, diarrhea, constipation, blood in stool and melena.  Genitourinary:       Chronic genital pain reported   Musculoskeletal: Positive for myalgias and back pain.  Skin: Negative for itching and rash.  Neurological: Negative for dizziness, tingling, tremors, sensory change, speech change, focal weakness, seizures, loss of consciousness, weakness and headaches.  Endo/Heme/Allergies: Negative for environmental allergies and polydipsia. Does not bruise/bleed easily.  Psychiatric/Behavioral: Positive for depression. The patient is nervous/anxious. The patient does not have insomnia.     Blood pressure 109/68, pulse 71, temperature 98.9 F (37.2 C), temperature source Oral, resp. rate 17, height 5\' 8"  (1.727 m), weight 72.576 kg (160 lb), SpO2 100 %.Body mass index is 24.33 kg/(m^2).  General Appearance: Neat and Well Groomed  Patent attorney::  improved  Speech:  Normal Rate  Volume:  Normal  Mood:  Euphoric  Affect:  Congruent  Thought Process:  Circumstantial and Tangential  Orientation:  Other:  fully alert and attentive  Thought Content:  not internally preoccupied at this time. No dellusions expressed, somatically  focused  Suicidal Thoughts:  No at this time denies any thoughts of hurting self and  contracts for safety on unit   Homicidal  Thoughts:  No  Memory:  Recent and remote fair   Judgement:  Fair  Insight:  Fair  Psychomotor Activity:  Normal  Concentration:  Fair  Recall:  Good  Fund of Knowledge:Good  Language: Good  Akathisia:  Negative   Handed:  Right  AIMS (if indicated):     Assets:  Desire for Improvement Resilience  Sleep:  Number of Hours: 3.5   Musculoskeletal: Strength & Muscle Tone: within normal limits Gait & Station: normal Patient leans: N/A  Current Medications: Current Facility-Administered Medications  Medication Dose Route Frequency Provider Last Rate Last Dose  . acetaminophen (TYLENOL) tablet 650 mg  650 mg Oral Q4H PRN Earney NavyJosephine C Onuoha, NP   650 mg at 01/17/14 1308  . alum & mag hydroxide-simeth (MAALOX/MYLANTA) 200-200-20 MG/5ML suspension 30 mL  30 mL Oral PRN Earney NavyJosephine C Onuoha, NP      . bisacodyl (DULCOLAX) EC tablet 5 mg  5 mg Oral Daily PRN Kerry HoughSpencer E Simon, PA-C      . DULoxetine (CYMBALTA) DR capsule 60 mg  60 mg Oral Daily Fransisca KaufmannLaura Davis, NP   60 mg at 01/18/14 0753  . ibuprofen (ADVIL,MOTRIN) tablet 600 mg  600 mg Oral Q8H PRN Kerry HoughSpencer E Simon, PA-C   600 mg at 01/18/14 0753  . Influenza vac split quadrivalent PF (FLUARIX) injection 0.5 mL  0.5 mL Intramuscular Tomorrow-1000 Rachael FeeIrving A Lugo, MD   0.5 mL at 01/14/14 1000  . lidocaine (XYLOCAINE) 5 % ointment   Topical BID PRN Craige CottaFernando A Cobos, MD      . LORazepam (ATIVAN) tablet 0.5 mg  0.5 mg Oral TID Craige CottaFernando A Cobos, MD   0.5 mg at 01/18/14 0753  . LORazepam (ATIVAN) tablet 0.5 mg  0.5 mg Oral Q8H PRN Craige CottaFernando A Cobos, MD   0.5 mg at 01/17/14 2134  . nicotine (NICODERM CQ - dosed in mg/24 hours) patch 21 mg  21 mg Transdermal Daily Earney NavyJosephine C Onuoha, NP   21 mg at 01/14/14 0800  . OLANZapine (ZYPREXA) tablet 5 mg  5 mg Oral QHS Sanjuana KavaAgnes I Nwoko, NP   5 mg at 01/17/14 2134  . OXcarbazepine (TRILEPTAL) tablet 150 mg  150 mg Oral BID Craige CottaFernando A Cobos, MD   150 mg at 01/18/14 0753  . pneumococcal 23 valent vaccine (PNU-IMMUNE) injection 0.5 mL  0.5 mL Intramuscular Tomorrow-1000 Rachael FeeIrving A Lugo, MD   0.5 mL at 01/14/14 1000  . traZODone (DESYREL) tablet 50 mg  50 mg Oral QHS,MR X 1 Spencer E Simon, PA-C   50 mg at 01/17/14 2317    Lab Results:  No  results found for this or any previous visit (from the past 48 hour(s)).  Physical Findings: AIMS: Facial and Oral Movements Muscles of Facial Expression: None, normal Lips and Perioral Area: None, normal Jaw: None, normal Tongue: None, normal,Extremity Movements Upper (arms, wrists, hands, fingers): None, normal Lower (legs, knees, ankles, toes): None, normal, Trunk Movements Neck, shoulders, hips: None, normal, Overall Severity Severity of abnormal movements (highest score from questions above): None, normal Incapacitation due to abnormal movements: None, normal Patient's awareness of abnormal movements (rate only patient's report): No Awareness, Dental Status Current problems with teeth and/or dentures?: No Does patient usually wear dentures?: No  CIWA:  CIWA-Ar Total: 0 COWS:  COWS Total Score: 0    Treatment Plan Summary: Daily contact with patient to assess and evaluate symptoms and progress in treatment Medication management See below  Plan: 1. Continue inpatient treatment , milieu, support 2.  Cymbalta 60 mg daily for mood 3. Ativan 0.5 mg TID for anxiety 4. Continue Trileptal 150 mgrs BID for improved mood stability. Level 1.5 5. Continue Zyprexa 5 mgrs QHS for mood stabilization 6. Placed order for patient to use Lidocaine cream (increase to TID) from home for chronic genital pain, Ben gay cream for joint pain 7. D/c consider for Sunday if symptoms continue to improve   Medical Decision Making Problem Points:  Established problem, stable/improving (1), Review of last therapy session (1) and Review of psycho-social stressors (1) Data Points:  Review of medication regiment & side effects (2) Review of new medications or change in dosage (2)  I certify that inpatient services furnished can reasonably be expected to improve the patient's condition.   Oletta DarterAGARWAL, Wenonah Milo MD 01/18/2014, 10:42 AM

## 2014-01-19 MED ORDER — MAGNESIUM HYDROXIDE 400 MG/5ML PO SUSP
5.0000 mL | Freq: Every day | ORAL | Status: DC | PRN
  Administered 2014-01-19: 5 mL via ORAL
  Filled 2014-01-19: qty 30

## 2014-01-19 MED ORDER — GLYCERIN (LAXATIVE) 2.1 G RE SUPP
1.0000 | Freq: Once | RECTAL | Status: DC
  Filled 2014-01-19: qty 1

## 2014-01-19 MED ORDER — ZOLPIDEM TARTRATE 5 MG PO TABS
5.0000 mg | ORAL_TABLET | Freq: Every evening | ORAL | Status: DC | PRN
  Administered 2014-01-19: 5 mg via ORAL
  Filled 2014-01-19: qty 1

## 2014-01-19 MED ORDER — LORAZEPAM 0.5 MG PO TABS
0.5000 mg | ORAL_TABLET | Freq: Four times a day (QID) | ORAL | Status: DC | PRN
  Administered 2014-01-19: 0.5 mg via ORAL
  Filled 2014-01-19 (×2): qty 1

## 2014-01-19 MED ORDER — INFLUENZA VAC SPLIT QUAD 0.5 ML IM SUSY
0.5000 mL | PREFILLED_SYRINGE | INTRAMUSCULAR | Status: AC
  Administered 2014-01-20: 0.5 mL via INTRAMUSCULAR
  Filled 2014-01-19: qty 0.5

## 2014-01-19 MED ORDER — NICOTINE POLACRILEX 2 MG MT GUM: 2.0000 mg | CHEWING_GUM | OROMUCOSAL | Status: DC | PRN

## 2014-01-19 NOTE — [facility] Group Notes (Signed)
Adult Psychoeducational Group Note  Date:  01/19/2014 Time:  11:54 AM  Group Topic/Focus:  Recovery Goals: Establishing healthy coping skills  Participation Level:  Appropriate  Participation Quality:  Attentive  Affect:  Anxious  Cognitive:  Alert and Appropriate  Insight: Good  Engagement in Group:  Improving  Modes of Intervention:  Clarification and Discussion  Additional Comments:    Vicki Thomas, Vicki Thomas 01/19/2014, 11:47 AM

## 2014-01-19 NOTE — Progress Notes (Signed)
D: Patient denies SI/HI and A/V hallucinations; patient reports sleep is poor; reports appetite is poor; reports energy level is normal ; reports ability to concentrate is good; rates depression as 0/10; rates hopelessness 0/10; rates anxiety as 4/10; patient reports anxiety  A: Monitored q 15 minutes; patient encouraged to attend groups; patient educated about medications; patient given medications per physician orders; patient encouraged to express feelings and/or concerns  R: Patient has frequent needs and is occupied with having a bowel movement; patient is pleasant but has to be redirected but patient reports that she is excited to go home; patient has a lot of anxiety;  patient's interaction with staff and peers is appropriate; patient was able to set goal to talk with staff 1:1 when having feelings of SI; patient is taking medications as prescribed and tolerating medications
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
## 2014-01-19 NOTE — Progress Notes (Signed)
The patient was found sitting on the floor in her bedroom, but did not fall. The patient explained that she woke up and found that her leg had fallen asleep. She denied falling to the floor. The patient proceeded to slide her body on the floor using her buttocks and a bed spread. She then insisted that this author lift her up by reaching underneath her arm pits. This author assisted her to a standing position, but she began to cry when she was not completely lifted off of the floor. The patient sat in a chair and proceeded to walk to the restroom on two separate occasions. In addition, she maneuvered around her room and took a shower without any staff assistance. Most recently, she walked out into the hallway on her own but stood on the balls of her right foot. The patient can be heard repeating the same statement to herself (she has to get herself organized). These events were reported off to her nurse.

## 2014-01-19 NOTE — Plan of Care (Signed)
Problem: Diagnosis: Increased Risk For Suicide Attempt Goal: LTG-Patient Will Report Improved Mood and Deny Suicidal LTG (by discharge) Patient will report improved mood and deny suicidal ideation.  Outcome: Progressing Pt stated she was feeling a little better and denied SI at this time Goal: STG-Patient Will Comply With Medication Regime Outcome: Progressing Pt takes medication as prescribed  Problem: Ineffective individual coping Goal: STG: Pt will be able to identify effective and ineffective STG: Pt will be able to identify effective and ineffective coping patterns  Outcome: Progressing Pt stated she was going to find ways to help her deal with the situations when she leaves, pt understands what she was doing in the past

## 2014-01-19 NOTE — Progress Notes (Signed)
[facility] MD Progress Note  01/19/2014 10:05 AM Vicki SchwartzValerie Thomas  MRN:  [id number] Subjective:  Overall pt states she is doing well. Denies depression and states she is feeling positive. She is having some anxiety about continued pain. Reports last night while sleeping her legs fell asleep and she eased herself to the floor using a blanket. Denies falls.   Patient states the Zyprexa is helping. She is having some leg swelling but it is decreased as compared to yesterday. No pitting edema and no redness noted. Sleep is poor due to nightmares. As far as her PTSD she is having nightmares and smells are triggers. Denies SI/HI, AVH. Pt is talking to family for support.   Pt is having arthritic pain and pain in her vagina. Also reports constipation.  Objective:  Patient is seen and chart is reviewed. Pt has been attending groups and is visible on the unit. Evaluated patient's legs which appear to have edema. No pitting noted. The patient reports a long history of being hypersensitive to medications.  Review of her medication allergies indicates multiple reported reactions to antipsychotic medications. As been complaining of pain in various locations. Per nursing pt was tangential but redirectable. No disruptive behavior noted.   Diagnosis:  Bipolar Disorder 1 Disorder, most recent episode depressed, PTSD per history.  Total Time spent with patient: 30 minutes  ADL's:  Improving   Sleep: poor  Appetite:  good  Suicidal Ideation:  At this time denies any suicidal ideations , and contracts for safety on unit Homicidal Ideation:  Denies  AEB (as evidenced by):  Psychiatric Specialty Exam: Physical Exam  Review of Systems  Constitutional: Negative for fever, chills, weight loss, malaise/fatigue and diaphoresis.  HENT: Negative for congestion, ear discharge, ear pain, hearing loss, nosebleeds and tinnitus.   Eyes: Negative for blurred vision, double vision, photophobia, pain, discharge and redness.   Respiratory: Negative for cough, hemoptysis, sputum production, shortness of breath, wheezing and stridor.   Cardiovascular: Positive for leg swelling. Negative for chest pain, palpitations, orthopnea, claudication and PND.  Gastrointestinal: Positive for constipation. Negative for heartburn, nausea, vomiting, abdominal pain, diarrhea, blood in stool and melena.  Genitourinary:       Chronic genital pain reported   Musculoskeletal: Positive for myalgias and back pain.  Skin: Negative for itching and rash.  Neurological: Negative for dizziness, tingling, tremors, sensory change, speech change, focal weakness, seizures, loss of consciousness, weakness and headaches.  Endo/Heme/Allergies: Negative for environmental allergies and polydipsia. Does not bruise/bleed easily.  Psychiatric/Behavioral: Positive for suicidal ideas. Negative for depression and hallucinations. The patient is nervous/anxious and has insomnia.     Blood pressure 108/57, pulse 79, temperature 99.2 F (37.3 C), temperature source Oral, resp. rate 16, height 5\' 8"  (1.727 m), weight 72.576 kg (160 lb), SpO2 100 %.Body mass index is 24.33 kg/(m^2).  General Appearance: Neat and Well Groomed  Patent attorneyye Contact::  improved  Speech:  Normal Rate  Volume:  Normal  Mood:  Euthymic  Affect:  Congruent  Thought Process:  Circumstantial  Orientation:  Other:  fully alert and attentive  Thought Content:  not internally preoccupied at this time. No dellusions expressed, somatically  focused  Suicidal Thoughts:  No at this time denies any thoughts of hurting self and  contracts for safety on unit   Homicidal Thoughts:  No  Memory:  Recent and remote fair   Judgement:  Fair  Insight:  Fair  Psychomotor Activity:  Normal  Concentration:  Fair  Recall:  Good  Fund of Knowledge:Good  Language: Good  Akathisia:  Negative  Handed:  Right  AIMS (if indicated):     Assets:  Desire for Improvement Resilience  Sleep:  Number of Hours: 3.75    Musculoskeletal: Strength & Muscle Tone: within normal limits Gait & Station: normal Patient leans: N/A  Current Medications: Current Facility-Administered Medications  Medication Dose Route Frequency Provider Last Rate Last Dose  . acetaminophen (TYLENOL) tablet 650 mg  650 mg Oral Q4H PRN Earney Navy, NP   650 mg at 01/17/14 1308  . alum & mag hydroxide-simeth (MAALOX/MYLANTA) 200-200-20 MG/5ML suspension 30 mL  30 mL Oral PRN Earney Navy, NP      . bisacodyl (DULCOLAX) EC tablet 5 mg  5 mg Oral Daily PRN Kerry Hough, PA-C      . camphor-menthol [facility]) lotion   Topical PRN Oletta Darter, MD      . DULoxetine (CYMBALTA) DR capsule 60 mg  60 mg Oral Daily Fransisca Kaufmann, NP   60 mg at 01/19/14 0816  . hydrOXYzine (ATARAX/VISTARIL) tablet 25 mg  25 mg Oral TID PRN Oletta Darter, MD   25 mg at 01/18/14 2155  . ibuprofen (ADVIL,MOTRIN) tablet 600 mg  600 mg Oral Q8H PRN Kerry Hough, PA-C   600 mg at 01/19/14 0815  . Influenza vac split quadrivalent PF (FLUARIX) injection 0.5 mL  0.5 mL Intramuscular Tomorrow-1000 Rachael Fee, MD   0.5 mL at 01/14/14 1000  . lidocaine (XYLOCAINE) 5 % ointment   Topical TID PRN Oletta Darter, MD      . LORazepam (ATIVAN) tablet 0.5 mg  0.5 mg Oral TID Craige Cotta, MD   0.5 mg at 01/19/14 0815  . LORazepam (ATIVAN) tablet 0.5 mg  0.5 mg Oral Q8H PRN Craige Cotta, MD   0.5 mg at 01/18/14 2231  . MUSCLE RUB CREA   Topical PRN Oletta Darter, MD      . nicotine (NICODERM CQ - dosed in mg/24 hours) patch 21 mg  21 mg Transdermal Daily Earney Navy, NP   21 mg at 01/14/14 0800  . OLANZapine (ZYPREXA) tablet 5 mg  5 mg Oral QHS Sanjuana Kava, NP   5 mg at 01/18/14 2155  . OXcarbazepine (TRILEPTAL) tablet 150 mg  150 mg Oral BID Craige Cotta, MD   150 mg at 01/19/14 0816  . pneumococcal 23 valent vaccine (PNU-IMMUNE) injection 0.5 mL  0.5 mL Intramuscular Tomorrow-1000 Rachael Fee, MD   0.5 mL at 01/14/14 1000  . traZODone  (DESYREL) tablet 50 mg  50 mg Oral QHS,MR X 1 Kerry Hough, PA-C   50 mg at 01/18/14 2156    Lab Results:  No results found for this or any previous visit (from the past 48 hour(s)).  Physical Findings: AIMS: Facial and Oral Movements Muscles of Facial Expression: None, normal Lips and Perioral Area: None, normal Jaw: None, normal Tongue: None, normal,Extremity Movements Upper (arms, wrists, hands, fingers): None, normal Lower (legs, knees, ankles, toes): None, normal, Trunk Movements Neck, shoulders, hips: None, normal, Overall Severity Severity of abnormal movements (highest score from questions above): None, normal Incapacitation due to abnormal movements: None, normal Patient's awareness of abnormal movements (rate only patient's report): No Awareness, Dental Status Current problems with teeth and/or dentures?: No Does patient usually wear dentures?: No  CIWA:  CIWA-Ar Total: 0 COWS:  COWS Total Score: 0    Treatment Plan Summary: Daily contact with patient to assess  and evaluate symptoms and progress in treatment Medication management See below  Plan: 1. Continue inpatient treatment , milieu, support 2. Cymbalta 60 mg daily for mood 3. Ativan 0.5 mg in to QID prn for anxiety 4. Continue Trileptal 150 mgrs BID for improved mood stability. Level 1.5 5. Continue Zyprexa 5 mgrs QHS for mood stabilization 6. Placed order for patient to use Lidocaine cream (increase to TID) from home for chronic genital pain, Ben gay cream for joint pain 7. D/c consider for Sunday if symptoms continue to improve 8. Constipation- milk of magnesia, glycerin suppository 9. Sleep- Ambien 5mg  po qHS prn insomnia  Medical Decision Making Problem Points:  Established problem, stable/improving (1), Review of last therapy session (1) and Review of psycho-social stressors (1) Data Points:  Review of medication regiment & side effects (2) Review of new medications or change in dosage (2)  I  certify that inpatient services furnished can reasonably be expected to improve the patient's condition.   Oletta DarterAGARWAL, Tiffanee Mcnee MD 01/19/2014, 10:05 AM

## 2014-01-19 NOTE — Progress Notes (Signed)
Pt stated that she had a good day and is looking forward to being discharged tomorrow. Pt feels a lot better and looking forward to outpatient services.

## 2014-01-19 NOTE — Progress Notes (Signed)
Pt rolled out of bed and sat on floor again tonight and started screaming and  Hollering. Pt was helped up and escorted to the bathroom , where she took a shower. Pt continues to exhibit histrionic behaviors.

## 2014-01-20 MED ORDER — OLANZAPINE 5 MG PO TABS: 5.0000 mg | ORAL_TABLET | Freq: Every day | ORAL | Status: DC

## 2014-01-20 MED ORDER — DULOXETINE HCL 60 MG PO CPEP: 60.0000 mg | ORAL_CAPSULE | Freq: Every day | ORAL | Status: DC

## 2014-01-20 MED ORDER — LUBIPROSTONE 24 MCG PO CAPS: 24.0000 ug | ORAL_CAPSULE | Freq: Two times a day (BID) | ORAL | Status: DC

## 2014-01-20 MED ORDER — OXCARBAZEPINE 150 MG PO TABS: 150.0000 mg | ORAL_TABLET | Freq: Two times a day (BID) | ORAL | Status: DC

## 2014-01-20 MED ORDER — OCUVITE-LUTEIN PO CAPS: 1.0000 | ORAL_CAPSULE | Freq: Every day | ORAL | Status: DC

## 2014-01-20 MED ORDER — TRAZODONE HCL 50 MG PO TABS: 50.0000 mg | ORAL_TABLET | Freq: Every evening | ORAL | Status: DC | PRN

## 2014-01-20 MED ORDER — ESOMEPRAZOLE MAGNESIUM 40 MG PO CPDR: 40.0000 mg | DELAYED_RELEASE_CAPSULE | Freq: Every day | ORAL | Status: DC

## 2014-01-20 NOTE — Plan of Care (Signed)
Problem: Alteration in mood Goal: STG-Patient reports thoughts of self-harm to staff Outcome: Progressing Pt denies thoughts of self-harm.  She verbally contracted for safety.  Pt reported that she will notify staff if she has thoughts of harming herself.

## 2014-01-20 NOTE — Discharge Summary (Signed)
Physician Discharge Summary Note  Patient:  Vicki Thomas is an 53 y.o., female MRN:  [id number] DOB:  11/09/1960 Patient phone:  [phone] (home)  Patient address[place]   [address] [place] [address],  Total Time spent with patient: 30 minutes  Date of Admission:  01/13/2014 Date of Discharge: 01/20/14  Reason for Admission:  Mood stabilization treatments  Discharge Diagnoses: Active Problems:   Bipolar I disorder, most recent episode (or current) unspecified   Psychiatric Specialty Exam: Physical Exam  Psychiatric: She has a normal mood and affect. Her speech is normal and behavior is normal. Judgment and thought content normal. Cognition and memory are normal.    Review of Systems  Constitutional: Negative.   HENT: Negative.   Eyes: Negative.   Respiratory: Negative.   Cardiovascular: Negative.   Gastrointestinal: Negative.   Genitourinary:       Continues to report chronic genital pain.   Musculoskeletal: Negative.   Skin: Negative.   Neurological: Negative.   Endo/Heme/Allergies: Negative.   Psychiatric/Behavioral: Negative.     Blood pressure 135/85, pulse 89, temperature 98.1 F (36.7 C), temperature source Oral, resp. rate 16, height 5\' 8"  (1.727 m), weight 72.576 kg (160 lb), SpO2 100 %.Body mass index is 24.33 kg/(m^2).  See Physician SRA     Past Psychiatric History: See H&P Diagnosis:  Hospitalizations:  Outpatient Care:  Substance Abuse Care:  Self-Mutilation:  Suicidal Attempts:  Violent Behaviors:   Musculoskeletal: Strength & Muscle Tone: within normal limits Gait & Station: normal Patient leans: N/A  DSM5:  Axis Diagnosis:   AXIS I: Bipolar, Depressed and Post Traumatic Stress Disorder AXIS II: Deferred AXIS III:  Past Medical History  Diagnosis Date  . Interstitial cystitis   . Fibromyalgia   . Chronic fatigue   . Asthma   . Arthritis   . Bipolar 1 disorder   . PTSD (post-traumatic stress  disorder)   . Lymphedema   . Panic disorder with agoraphobia   . Chronic pain   . Hiatal hernia   . Chronic headaches   . Chronic back pain    AXIS IV: other psychosocial or environmental problems AXIS V: 51-60 moderate symptoms  Level of Care:  OP  Hospital Course:  Vicki Thomas is a 53 year old female, who has been diagnosed with Bipolar Disorder in the past. She has been feeling depressed " for a long time" and recently was feeling more so. After a conversation with her daughter, where she was told she would not be able to see her grandson any more, she developed suicidal ideations. She overdosed on opiates after writing a good bye letter to her family. Patient states she does not remember how she got to hospital. States she has been experiencing hallucinations ( auditory) for years, but they have been bothering her more recently. She also describes neuro-vegetative symptoms of depression.         Vicki Thomas was admitted to the adult unit where she was evaluated and her symptoms were identified. Medication management was discussed and implemented. Her Cymbalta was decreased to 60 mg daily due to concern about possible effect of acute mania. Patient was started on Zyprexa 5 mg at hs for improved mood stability and for treatment of psychosis. She was encouraged to participate in unit programming. Medical problems were identified and treated appropriately. Her Lidocaine cream from home was ordered to help with her report of vaginal pain. Home medication was restarted as needed.  She was evaluated each day by a  clinical provider to ascertain the patient's response to treatment.  Improvement was noted by the patient's report of decreasing symptoms, improved sleep and appetite, affect, medication tolerance, behavior, and participation in unit programming.  The patient was asked each day to complete a self inventory noting mood, mental status, pain, new symptoms, anxiety and  concerns.         She responded well to medication and being in a therapeutic and supportive environment. Positive and appropriate behavior was noted and the patient was motivated for recovery.  She worked closely with the treatment team and case manager to develop a discharge plan with appropriate goals. Coping skills, problem solving as well as relaxation therapies were also part of the unit programming. Patient expressed concern that Zyprexa could be causing leg swelling. This was monitored but was found to improve without changing her treatment regimen.          By the day of discharge she was in much improved condition than upon admission.  Symptoms were reported as significantly decreased or resolved completely.  The patient denied SI/HI and voiced no AVH. She was motivated to continue taking medication with a goal of continued improvement in mental health.  Vicki Thomas was discharged home with a plan to follow up as noted below. Patient was provided with sample medications and prescriptions at time of discharge. She left [facility] in stable condition and was picked up from the lobby by her sister.   Consults:  None  Significant Diagnostic Studies:  Admission labs reviewed and completed  Discharge Vitals:   Blood pressure 135/85, pulse 89, temperature 98.1 F (36.7 C), temperature source Oral, resp. rate 16, height 5\' 8"  (1.727 m), weight 72.576 kg (160 lb), SpO2 100 %. Body mass index is 24.33 kg/(m^2). Lab Results:   No results found for this or any previous visit (from the past 72 hour(s)).  Physical Findings: AIMS: Facial and Oral Movements Muscles of Facial Expression: None, normal Lips and Perioral Area: None, normal Jaw: None, normal Tongue: None, normal,Extremity Movements Upper (arms, wrists, hands, fingers): None, normal Lower (legs, knees, ankles, toes): None, normal, Trunk Movements Neck, shoulders, hips: None, normal, Overall Severity Severity of abnormal movements (highest  score from questions above): None, normal Incapacitation due to abnormal movements: None, normal Patient's awareness of abnormal movements (rate only patient's report): No Awareness, Dental Status Current problems with teeth and/or dentures?: No Does patient usually wear dentures?: No  CIWA:  CIWA-Ar Total: 0 COWS:  COWS Total Score: 0  Psychiatric Specialty Exam: See Psychiatric Specialty Exam and Suicide Risk Assessment completed by Attending Physician prior to discharge.  Discharge destination:  Home  Is patient on multiple antipsychotic therapies at discharge:  No   Has Patient had three or more failed trials of antipsychotic monotherapy by history:  No  Recommended Plan for Multiple Antipsychotic Therapies: NA  Discharge Instructions    Discharge instructions    Complete by:  As directed   Please make sure to follow up with your Primary Care Provider for further management of chronic medical problems.            Medication List    STOP taking these medications        acetaminophen 500 MG tablet  Commonly known as:  TYLENOL     Brexpiprazole 2 MG Tabs  Commonly known as:  REXULTI     furosemide 40 MG tablet  Commonly known as:  LASIX     GAS-X PO  Suvorexant 20 MG Tabs  Commonly known as:  BELSOMRA      TAKE these medications      Indication   DULoxetine 60 MG capsule  Commonly known as:  CYMBALTA  Take 1 capsule (60 mg total) by mouth daily.   Indication:  Musculoskeletal Pain, Depression     esomeprazole 40 MG capsule  Commonly known as:  NEXIUM  Take 1 capsule (40 mg total) by mouth daily.   Indication:  Heartburn     ibuprofen 200 MG tablet  Commonly known as:  ADVIL,MOTRIN  Take 400 mg by mouth every 8 (eight) hours as needed. pain      lubiprostone 24 MCG capsule  Commonly known as:  AMITIZA  Take 1 capsule (24 mcg total) by mouth 2 (two) times daily with a meal.   Indication:  Chronic Constipation of Unknown Cause      multivitamin-lutein Caps capsule  Take 1 capsule by mouth daily.   Indication:  Vitamin and Mineral Deficiency     OLANZapine 5 MG tablet  Commonly known as:  ZYPREXA  Take 1 tablet (5 mg total) by mouth at bedtime.   Indication:  Manic-Depression, Mood control     OXcarbazepine 150 MG tablet  Commonly known as:  TRILEPTAL  Take 1 tablet (150 mg total) by mouth 2 (two) times daily.   Indication:  Manic-Depression     traZODone 50 MG tablet  Commonly known as:  DESYREL  Take 1 tablet (50 mg total) by mouth at bedtime and may repeat dose one time if needed.   Indication:  Trouble Sleeping           Follow-up Information    [facility]  On [facility].   Why:  You are scheduled with Dr. Tenny Crawoss on Wednesday, February 06, 2013 at 11:30 AM. Please cancel this appt if you are accepted into Psych IOP. [place] SinkRita will set you up with [facility].    Contact information:   [address] [place], [place]   [address] [phone]      Follow up with [facility] On 01/31/2014.   Why:  Assessment/orientation with [name] for Psych IOP on this date. Please arrive at 8:30AM for appt with completed "new patient packet."    Contact information:   [address] [place], [place] [address] Phone: [phone] Fax: [phone]      Follow-up recommendations:   Activity: as tolerated Diet: resume normal diet Other: f/up with psychiatrist and take meds as prescribed  Comments:   Take all your medications as prescribed by your mental healthcare provider.  Report any adverse effects and or reactions from your medicines to your outpatient provider promptly.  Patient is instructed and cautioned to not engage in alcohol and or illegal drug use while on prescription medicines.  In the event of worsening symptoms, patient is instructed to call the crisis hotline, 911 and or go to the nearest ED for appropriate evaluation and treatment  of symptoms.  Follow-up with your primary care provider for your other medical issues, concerns and or health care needs.   Total Discharge Time:  Greater than 30 minutes.  SignedFransisca Kaufmann: Brandn Mcgath NP-C 01/20/2014, 9:25 AM

## 2014-01-20 NOTE — [facility] Group Notes (Signed)
[facility] Group Notes:  (Nursing/MHT/Case Management/Adjunct)  Date:  01/20/2014  Time:  0915am  Type of Therapy:  Healthy support systems nursing group  Participation Level:  Active  Participation Quality:  Appropriate and Attentive  Affect:  Appropriate  Cognitive:  Alert and Appropriate  Insight:  Appropriate and Good  Engagement in Group:  Engaged  Modes of Intervention:  Discussion and Support  Summary of Progress/Problems: Patient attend group and responded appropriately.  Lendell CapriceGuthrie, Rydell Wiegel A 01/20/2014, 10:53 AM

## 2014-01-20 NOTE — Progress Notes (Signed)
[name] Discharge [facility] :  Will you be returning to the same living situation after discharge: Yes,  returning home At discharge, do you have transportation home?:Yes,  sister will pick pt up Do you have the ability to pay for your medications:Yes,  access to meds - provided prescriptions and reports ability to afford them  Release of information consent forms completed and in the chart;  Patient's signature needed at discharge.  Patient to Follow up at: Follow-up Information    [facility] On 02/06/2014.   Why:  You are scheduled with Dr. Tenny Crawoss on Wednesday, February 06, 2013 at 11:30 AM. Please cancel this appt if you are accepted into Psych IOP. [place] SinkRita will set you up with psychiatrist [facility].    Contact information:   [address] [place], [place]   [address] [phone]      Follow up with [facility] On 01/31/2014.   Why:  Assessment/orientation with [name] for Psych IOP on this date. Please arrive at 8:30AM for appt with completed "new patient packet."    Contact information:   [address] [place], [place] [address] Phone: [phone] Fax: [phone]      Patient denies SI/HI:   Yes,  denies SI/HI today    Safety Planning and Suicide Prevention discussed:  Yes,  discussed with sister  N/A patient is not a smoker  Horton, Vicki ArntChelsea Thomas 01/20/2014, 9:24 AM

## 2014-01-20 NOTE — [facility] Suicide Risk Assessment (Signed)
   Demographic Factors:  Caucasian, Low socioeconomic status and Unemployed  Total Time spent with patient: 30 minutes  Psychiatric Specialty Exam: Physical Exam  Review of Systems  Cardiovascular: Positive for leg swelling.  Psychiatric/Behavioral: Negative for depression, suicidal ideas and hallucinations. The patient has insomnia. The patient is not nervous/anxious.     Blood pressure 135/85, pulse 89, temperature 98.1 F (36.7 C), temperature source Oral, resp. rate 16, height 5\' 8"  (1.727 m), weight 72.576 kg (160 lb), SpO2 100 %.Body mass index is 24.33 kg/(m^2).  General Appearance: Neat  Eye Contact::  Good  Speech:  Clear and Coherent and Normal Rate  Volume:  Normal  Mood:  Euthymic  Affect:  Appropriate and Full Range  Thought Process:  Intact, Linear and Logical  Orientation:  Full (Time, Place, and Person)  Thought Content:  Negative  Suicidal Thoughts:  No  Homicidal Thoughts:  No  Memory:  Immediate;   Good Recent;   Good Remote;   Good  Judgement:  Good  Insight:  Good  Psychomotor Activity:  Normal  Concentration:  Fair  Recall:  Fair  Fund of Knowledge:Good  Language: Good  Akathisia:  No  Handed:  Right  AIMS (if indicated):     Assets:  Communication Skills Desire for Improvement Housing Social Support  Sleep:  Number of Hours: 3.75    Musculoskeletal: Strength & Muscle Tone: within normal limits Gait & Station: normal Patient leans: N/A   Mental Status Per Nursing Assessment::   On Admission:   depressed with SA by OD and AH  Current Mental Status by Physician: NA denies depression, SI/HI, AVH  Loss Factors: Decline in physical health and Financial problems/change in socioeconomic status  Historical Factors: Prior suicide attempts  Risk Reduction Factors:   Sense of responsibility to family, Positive social support and Positive coping skills or problem solving skills  Continued Clinical Symptoms:  More than one psychiatric  diagnosis  Cognitive Features That Contribute To Risk:  n/a  Suicide Risk:  Minimal: No identifiable suicidal ideation.  Patients presenting with no risk factors but with morbid ruminations; may be classified as minimal risk based on the severity of the depressive symptoms  Discharge Diagnoses:   AXIS I:  Bipolar, Depressed and Post Traumatic Stress Disorder AXIS II:  Deferred AXIS III:   Past Medical History  Diagnosis Date  . Interstitial cystitis   . Fibromyalgia   . Chronic fatigue   . Asthma   . Arthritis   . Bipolar 1 disorder   . PTSD (post-traumatic stress disorder)   . Lymphedema   . Panic disorder with agoraphobia   . Chronic pain   . Hiatal hernia   . Chronic headaches   . Chronic back pain    AXIS IV:  other psychosocial or environmental problems AXIS V:  51-60 moderate symptoms  Plan Of Care/Follow-up recommendations:  Activity:  as tolerated Diet:  resume normal diet Other:  f/up with psychiatrist and take meds as prescribed  Is patient on multiple antipsychotic therapies at discharge:  No   Has Patient had three or more failed trials of antipsychotic monotherapy by history:  No  Recommended Plan for Multiple Antipsychotic Therapies: NA    Deanna Wiater 01/20/2014, 8:30 AM

## 2014-01-20 NOTE — Progress Notes (Signed)
D: Pt was moved into room with 1:1 pt by [facility] at shift change per report.  Pt reported her day has "been interesting.  I had some anxiety earlier.  I'm leaving tomorrow.  I feel positive and ready.  I can't wait to see my baby Bella Kennedy and my friends."  Pt reports Bella Kennedy is her dog.  Pt reports "I'll just deal with whatever comes up and know that it will take place and it may be frustrating but I can do this."  Pt denies SI/HI, denies hallucinations. Pt was cooperative with having a roommate.   A: Medications administered per order, except for suppository which pt refused, stating "I'm leaving tomorrow.  I'll just take care of it tomorrow."  Safety maintained.  Met with pt 1:1 and provided support and encouragement.  Actively listened to pt.  Strategies to prevent falls reviewed with pt.  PRN medications administered for pain, anxiety, sleep, see flowsheet.   R: Pt verbally contracts for safety.  She reported that she will notify staff of needs and concerns.  Will continue to monitor and assess for safety.

## 2014-01-20 NOTE — Progress Notes (Signed)
D: Patient is alert and oriented. Pt's mood and affect is "good," pleasant, euphoric, and appropriate to circumstance. Pt's eye contact is fair. Pt's speech is tangential but not as severe as previous days. Pt denies SI/HI and AVH. Pt complains of anxiety today. Pt is attending and participating in groups. Pt rates her depression and hopelessness 0/10, and her anxiety 6/10. Pt reports "I'm excited and happy because I'm going home." Pt reports her goal for the day is to "stay focused and remember to use each coping skill that will help." Pt complains of back pain (See Docflowsheet-vitals) with little relief from PRN medication.  A: Encouragement/Support provided to pt. Active listening by RN. PRN medication administered for anxiety and pain per providers orders (See MAR). Scheduled medications administered per providers orders (See MAR). 15 minute checks completed per protocol for patient safety.  R: Pt cooperative and receptive to nursing interventions.

## 2014-01-20 NOTE — Progress Notes (Signed)
DISCHARGE NOTE: D: Patient alert and oriented upon discharge in stable condition, ambulatory with a steady gait. Pt denies SI/HI and AVH. A: AVS reviewed and a copy was given to pt. Prescriptions/Medication given to pt. Follow up reviewed. Belongings returned to pt. Pt was given time to ask questions and express concerns. Resources reviewed including, NAMI. R: Pt D/C'd to sister.

## 2014-01-20 NOTE — [facility] Group Notes (Signed)
Adult Group Therapy Note  Date:  01/20/2014 Time:  1:15 PM  Group Topic/Focus: Feelings about discharge and creating support network  Participation Level:  Did Not Attend; patient in process of discharge when group began   Clide DalesHarrill, Catherine Campbell 01/20/2014

## 2014-01-22 NOTE — Telephone Encounter (Signed)
Called pt and left message for her to call office due to previous call. This is the second attempt

## 2014-01-23 NOTE — Progress Notes (Signed)
Patient Discharge Instructions:  Next Level Care Provider Has Access to the EMR, 01/23/14  Records [facility] via [facility]/Epic access.  Jerelene ReddenSheena E Woodmont, 01/23/2014, 1:37 PM

## 2014-02-04 ENCOUNTER — Encounter (HOSPITAL_COMMUNITY): Payer: Self-pay

## 2014-02-04 ENCOUNTER — Other Ambulatory Visit (HOSPITAL_COMMUNITY): Payer: 59 | Attending: Psychiatry | Admitting: Psychiatry

## 2014-02-04 DIAGNOSIS — F4310 Post-traumatic stress disorder, unspecified: Secondary | ICD-10-CM | POA: Insufficient documentation

## 2014-02-04 DIAGNOSIS — F17210 Nicotine dependence, cigarettes, uncomplicated: Secondary | ICD-10-CM | POA: Diagnosis not present

## 2014-02-04 DIAGNOSIS — F3162 Bipolar disorder, current episode mixed, moderate: Secondary | ICD-10-CM
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
## 2014-02-05 ENCOUNTER — Other Ambulatory Visit (HOSPITAL_COMMUNITY): Payer: 59 | Admitting: Psychiatry

## 2014-02-05 ENCOUNTER — Telehealth (HOSPITAL_COMMUNITY): Payer: Self-pay | Admitting: Psychiatry

## 2014-02-06 ENCOUNTER — Encounter (HOSPITAL_COMMUNITY): Payer: Self-pay | Admitting: Psychiatry

## 2014-02-06 ENCOUNTER — Ambulatory Visit (HOSPITAL_COMMUNITY): Payer: Self-pay | Admitting: Psychiatry

## 2014-02-06 ENCOUNTER — Other Ambulatory Visit (HOSPITAL_COMMUNITY): Payer: 59 | Admitting: Psychiatry

## 2014-02-06 DIAGNOSIS — F3160 Bipolar disorder, current episode mixed, unspecified: Secondary | ICD-10-CM

## 2014-02-06 DIAGNOSIS — F3162 Bipolar disorder, current episode mixed, moderate: Secondary | ICD-10-CM | POA: Diagnosis not present

## 2014-02-06 MED ORDER — ZOLPIDEM TARTRATE 5 MG PO TABS: 5.0000 mg | ORAL_TABLET | Freq: Every evening | ORAL | Status: DC | PRN

## 2014-02-06 NOTE — Progress Notes (Signed)
    Daily Group Progress Note  Program: IOP  Group Time: 9:00-10:30  Participation Level: Active  Behavioral Response: Appropriate  Type of Therapy:  Psycho-education Group  Summary of Progress: Pt. Met with case Freight forwarder.      Group Time: 10:30-12:00  Participation Level:  Active  Behavioral Response: Appropriate  Type of Therapy: Group Therapy  Summary of Progress: Pt. Met with case Freight forwarder.   [facility]-PIOPB PSYCH
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
## 2014-02-07 ENCOUNTER — Other Ambulatory Visit (HOSPITAL_COMMUNITY): Payer: 59 | Admitting: Psychiatry

## 2014-02-07 DIAGNOSIS — F3162 Bipolar disorder, current episode mixed, moderate: Secondary | ICD-10-CM | POA: Diagnosis not present

## 2014-02-07 DIAGNOSIS — F3160 Bipolar disorder, current episode mixed, unspecified: Secondary | ICD-10-CM

## 2014-02-08 ENCOUNTER — Encounter (HOSPITAL_COMMUNITY): Payer: Self-pay | Admitting: Psychiatry

## 2014-02-08 ENCOUNTER — Other Ambulatory Visit (HOSPITAL_COMMUNITY): Payer: 59 | Admitting: Psychiatry

## 2014-02-08 DIAGNOSIS — F3162 Bipolar disorder, current episode mixed, moderate: Secondary | ICD-10-CM | POA: Diagnosis not present

## 2014-02-08 DIAGNOSIS — F3160 Bipolar disorder, current episode mixed, unspecified: Secondary | ICD-10-CM

## 2014-02-08 NOTE — Progress Notes (Signed)
    Daily Group Progress Note  Program: IOP  Group Time:  9:00-10:30  Participation Level: Active  Behavioral Response: Appropriate  Type of Therapy:  Group Therapy  Summary of Progress: Pt. Met with case manager and psychiatrist.     Group Time: 10:30-12:00  Participation Level:  Active  Behavioral Response: Appropriate  Type of Therapy: Psycho-education Group  Summary of Progress: Pt. Discussed relationship history, high expectations from family for achievement. Pt. Participated in discussion about developing healthy relationship boundaries.   Nancie Neas, LPC
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
## 2014-02-08 NOTE — Progress Notes (Signed)
    Daily Group Progress Note  Program: IOP  Group Time: 9:00-10:30  Participation Level: Active  Behavioral Response: Appropriate  Type of Therapy:  Group Therapy  Summary of Progress: Pt. Reported that her sleep has improved and she was feeling more relaxed. Pt. Attributed improved mood to participation in yesterday's meditation. Pt. Watched and discussed Mel Robbins video on developing motivation.      Group Time: 10:30-12:00  Participation Level:  Minimal  Behavioral Response: Appropriate  Type of Therapy: Psycho-education Group  Summary of Progress: Pt. Left group due to problems with her catheter.  BH-PIOPB PSYCH

## 2014-02-11 ENCOUNTER — Other Ambulatory Visit (HOSPITAL_COMMUNITY): Payer: 59

## 2014-02-11 NOTE — Telephone Encounter (Signed)
D:  Placed call to pt because she didn't attend MH-IOP today.  Pt states that she didn't feel well today and didn't know if the center would be open today due to holiday.  Pt also mentioned that she stopped taking her Trazodone and Zyprexa due to edema worsening.  Plans to return to IOP tomorrow.  A:  Inform Dr. Ladona Ridgelaylor.

## 2014-02-12 ENCOUNTER — Other Ambulatory Visit (HOSPITAL_COMMUNITY): Payer: 59

## 2014-02-13 ENCOUNTER — Other Ambulatory Visit (HOSPITAL_COMMUNITY): Payer: 59

## 2014-02-14 ENCOUNTER — Other Ambulatory Visit (HOSPITAL_COMMUNITY): Payer: 59

## 2014-02-15 ENCOUNTER — Other Ambulatory Visit (HOSPITAL_COMMUNITY): Payer: 59

## 2014-02-18 ENCOUNTER — Other Ambulatory Visit (HOSPITAL_COMMUNITY): Payer: 59

## 2014-02-19 ENCOUNTER — Other Ambulatory Visit (HOSPITAL_COMMUNITY): Payer: 59

## 2014-02-20 ENCOUNTER — Other Ambulatory Visit (HOSPITAL_COMMUNITY): Payer: 59

## 2014-02-21 ENCOUNTER — Other Ambulatory Visit (HOSPITAL_COMMUNITY): Payer: 59
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
## 2014-02-22 ENCOUNTER — Other Ambulatory Visit (HOSPITAL_COMMUNITY): Payer: 59

## 2014-02-25 ENCOUNTER — Other Ambulatory Visit (HOSPITAL_COMMUNITY): Payer: 59

## 2014-02-26 ENCOUNTER — Other Ambulatory Visit (HOSPITAL_COMMUNITY): Payer: 59 | Admitting: Psychiatry

## 2014-02-26 NOTE — Progress Notes (Signed)
  [facility] Discharge Summary  Manuela SchwartzValerie Matera [id number]  Admission date: 02/04/2014 Discharge date: 02/26/2014  Reason for admission: chronic depression and post inpatient treatment  Chemical Use History: none of relavance  Family of Origin Issues: estranged from her entire family including her children  Progress in Program Toward Treatment Goals: no progress  Progress (rationale): Ms Tyler Deisarles had many medical issues that prevented her attending the program so that most of her stay were absences.  She was more focused on finding a program that would treat her PTSD and was disappointed that none could be found.    Benjaman PottAYLOR,Refael Fulop D, MD 02/26/2014

## 2014-02-27 ENCOUNTER — Other Ambulatory Visit (HOSPITAL_COMMUNITY): Payer: 59

## 2014-02-28 ENCOUNTER — Other Ambulatory Visit (HOSPITAL_COMMUNITY): Payer: 59

## 2014-03-01 ENCOUNTER — Other Ambulatory Visit (HOSPITAL_COMMUNITY): Payer: 59

## 2014-03-04 ENCOUNTER — Other Ambulatory Visit (HOSPITAL_COMMUNITY): Payer: 59

## 2014-03-05 ENCOUNTER — Other Ambulatory Visit (HOSPITAL_COMMUNITY): Payer: 59

## 2014-03-06 ENCOUNTER — Other Ambulatory Visit (HOSPITAL_COMMUNITY): Payer: 59

## 2014-03-07 ENCOUNTER — Other Ambulatory Visit (HOSPITAL_COMMUNITY): Payer: 59

## 2014-03-08 ENCOUNTER — Other Ambulatory Visit (HOSPITAL_COMMUNITY): Payer: 59

## 2014-03-11 ENCOUNTER — Other Ambulatory Visit (HOSPITAL_COMMUNITY): Payer: 59

## 2014-03-12 ENCOUNTER — Other Ambulatory Visit (HOSPITAL_COMMUNITY): Payer: 59

## 2014-03-13 ENCOUNTER — Other Ambulatory Visit (HOSPITAL_COMMUNITY): Payer: 59

## 2014-03-14 ENCOUNTER — Other Ambulatory Visit (HOSPITAL_COMMUNITY): Payer: 59

## 2014-03-15 ENCOUNTER — Other Ambulatory Visit (HOSPITAL_COMMUNITY): Payer: 59
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
## 2014-05-19 NOTE — Discharge Summary (Signed)
PATIENT NAME:  Vicki Thomas, Vicki Thomas MR#:  [id number] DATE OF BIRTH:  October 05, 1960  DATE OF ADMISSION:  04/15/2011 DATE OF DISCHARGE:  04/28/2011  HOSPITAL COURSE: The patient was admitted to the hospital with worsening depression and failure of outpatient treatment. She had suicidal ideation and reported auditory hallucinations. The patient has a long history of depression that had been resistant to multiple medications. Dr. Jeanie Sewer referred her for ECT treatment and I evaluated the patient early in her hospital stay. It appeared that because of her history of recurrent severe depression possibly with psychotic features, failure to respond to other medication, and willingness to consider ECT that she would be a reasonable ECT candidate. The patient was given an opportunity to ask questions and consider treatment and did voluntarily request ECT treatment. Treatment was started on the usual three times a week schedule. At this point she has had four right unilateral ECT treatments. The patient has tolerated treatment well. She has had some headache after treatments and some nausea but otherwise minimal side effects without major memory loss or confusion. After the second or third treatment, the patient started to report a significant improvement in her mood. As of today after her fourth treatment she reports that her mood is feeling much better. She has not been on any other psychiatric medicine to treat her depression during her hospital stay because of her failure to respond to other medicines. At the time of discharge, she is requesting doxepin be considered because of her difficulty sleeping in the past and that doxepin had been helpful. Side effects including risk of overdose were reviewed. The patient will be prescribed doxepin to try to help with sleep at discharge. At this point, the patient is totally denying any suicidal ideation. Denying any hallucinations. Not showing signs of psychosis. She is requesting to  be discharged home where she lives with her son. Her son can assist with bringing her in for outpatient ECT. At this point, I would like to continue outpatient ECT since she is showing improvement until we get maximum benefit and then work on probably a maintenance program. The patient is agreeable to all of this. She will be discharged to follow-up with maintenance ECT on Friday. In addition, during this hospital stay we tried to do a work-up of her chronic elevated liver enzymes. The patient had had longstanding elevated liver enzymes that went up and down without a clear diagnosis. Medical consult was obtained. Multiple labs were done. Serial liver function tests were checked. They gradually were improving although they remain elevated. Ultrasound was done of the abdomen which showed some dilatation of ducts but she has had a cholecystectomy. Her Hepatitis A, B, and C tests are negative. She had some serologies checked for autoimmune hepatitis which are potentially contributory to a diagnosis but I do not think that has been completely determined at this point. She continues to have multiple somatic complaints, especially nausea, chronic difficulty with digestion, chronic pain but all of that appears to be much improved. There has not been a significant change to her medications that she takes for her outpatient medical treatment.   DISCHARGE MEDICATIONS:  1. Oxycodone 10 mg q.6 hours as needed for pain.  2. Premarin 1.25 mg per day.  3. Lasix 40 mg per day.  4. Potassium chloride 10 mEq per day.  5. Flomax 0.4 mg per day.  6. Pyridium 100 mg 3 times a day.  7. Phenergan 25 mg q.6 hours p.r.n. for nausea.  8. Robaxin  500 mg 3 times a day p.r.n. for pain.  9. Magnesium oxide 400 mg twice a day. 10. Lactobacillus probiotic 2 capsules twice a day.  11. Multivitamin one per day.  12. Colace 200 mg twice a day.  13. Doxepin 50 mg at bedtime.  14. Oxymorphone extended-release 20 mg b.i.d.  15. Amitiza  24 mcg once twice a day.   LABORATORY RESULTS: Serial liver enzymes have been checked in the hospital. Labs at the time of admission showed a drug screen positive for opiates and benzodiazepines. Alcohol nondetectable. Lithium undetectable. Tegretol undetectable. Alkaline phosphatase 250, ALT 526, AST 439, total protein 6.2, albumin 3.4. The rest of the chemistry panel was unremarkable. Thyroid stimulating hormone elevated at 5.95. At other times she had liver function enzymes that went as high as an AST of 1603. Serial tests, as I mentioned before, have been done. Urinalysis showed some white blood cells and red blood cells. Final liver tests that I see showed the alkaline phosphatase improved all the way down to 162, ALT 64, AST 34, lipase 54. It was never really clear what had caused the elevation. There was some speculation about possibly medication related although unclear what that would be. Because of the large number of other labs, I suggest review of the computer for any other specifics. I will mention that the most recent work-up of the anti-smooth muscle antibody is in the negative range, however, the antimitochondrial antibody M2 is positive at 26.7.   MENTAL STATUS EXAMINATION: Casually dressed, neatly groomed woman. Normal energy level. Normal eye contact. Normal psychomotor activity. Normal speech. Affect euthymic, reactive and appropriate. Mood stated as being much better. Smiling. Thoughts lucid. No auditory or visual hallucinations. Denied suicidal or homicidal ideation. Denies delusions or hallucinations. Shows improved judgment and insight.   DIAGNOSES PRINCIPLE AND PRIMARY:  AXIS I: Major depression, severe, recurrent.   SECONDARY DIAGNOSES:  AXIS I: Deferred.   AXIS II: Deferred.   AXIS III:  1. Liver pathology of unclear type. 2. Chronic pain. 3. Status post surgery for weight loss. 4. Status post cholecystectomy. 5. Postmenopausal stage. 6. Chronic nausea.   AXIS IV:  Moderate. Stress from chronic medical problems.   AXIS V: Functioning at time of discharge 55.   ____________________________ Audery AmelJohn T. Damonie Furney, MD jtc:drc D: 04/28/2011 17:41:24 ET T: 04/29/2011 11:22:26 ET JOB#: [id number]  cc: Audery AmelJohn T. Chastity Noland, MD, <Dictator> Audery AmelJOHN T Loi Rennaker MD ELECTRONICALLY SIGNED 04/29/2011 14:53

## 2014-05-19 NOTE — Consult Note (Signed)
PATIENT NAME:  Vicki Thomas, Vicki Thomas MR#:  [id number] DATE OF BIRTH:  May 10, 1960  DATE OF CONSULTATION:  04/17/2011  REFERRING PHYSICIAN:  Dr. Jeanie SewerWilliford  CONSULTING PHYSICIAN:  Duncan Dulleresa Evy Lutterman, MD  REASON FOR CONSULTATION: Elevated liver function tests.   HISTORY OF PRESENT ILLNESS: Ms. Tyler Deisarles is a 54 year old white female with a history of depression, bipolar disorder, lymphedema involving the left side of the body, fibromyalgia, irritable bowel syndrome, chronic interstitial cystitis, and morbid obesity status post gastric bypass surgery who was admitted on March 21st with suicidal ideation and command self-destructive auditory hallucinations. When she was admitted, she had elevated liver enzymes, specifically AST was 1603, ALT 841, total bilirubin 1.3, and alkaline phosphatase was 262. The patient denies any recent use of alcohol, antibiotics, or surreptitious use of prior antipsychotics. She has a history of transient liver enzymes elevations which she states no doctor has been able to figure out. She definitely denies any recent use of alcohol as it has been known to irritate her interstitial cystitis. She does have chronic nausea with vomiting on an almost daily basis. In the past she has taken promethazine for this. She reports that she has had some nausea and vomiting since she has been here in the hospital due to multiple food intolerances. She does have chronic suprapubic pain which is aggravated by certain ingestions as well and reports those currently. She has no history of viral hepatitis.   PRIMARY CARE DOCTOR: She has none currently. The last medicine doctor she had seen was a Dr. Dell PontoZeller at an Urgent Care one week prior to admission.    PAST MEDICAL HISTORY:  1. Bipolar disorder with multiple failed trials of antipsychotics including Seroquel, Valium, Lamictal, Tegretol, Depakote, and lithium.  2. History of alcohol abuse, quit in 2006 with one relapse in 2008.  3. Morbid obesity status post  gastric bypass in [place] with loss of weight of nearly 250 pounds.  4. Interstitial cystitis, last treated by urologist Dr. Jacquelyne BalintMcDermott in [place]. Last seen October 2012.  CURRENT MEDICATIONS: 1. Amitiza 24 mcg twice daily for constipation.  2. Lasix 40 mg daily.  3. K-Dur 10 mEq daily.  4. Premarin, dose unknown. 5. Robaxin 500 mg t.i.d.  6. Oxymorphone ER 20 mg twice daily.  7. Oxycodone 20 mg 4 times daily as needed for pain per patient.  8. Pyridium 100 mg t.i.d.  9. Flomax 0.4 mg p.o. daily.   PAST SURGICAL HISTORY:   1. Cholecystectomy in 2007. 2. Hysterectomy and appendectomy in 1994. 3. Hemorrhoidectomy x2.  4. Gastric bypass in 2006 complicated by a small bowel obstruction 2 to 3 months post surgery requiring laparotomy.  5. Podiatric surgery, bilateral, for plantar fasciitis.  ALLERGIES: She has a list of allergies that is too numerous to list but penicillin, erythromycin, Allegra, and iodine all cause itching and rash. The other allergies appear to be more intolerances. However, Remeron causes blurred vision and tape causes blisters.   LAST HOSPITALIZATION: Unknown.   FAMILY HISTORY: Notable for mother who had both tobacco-induced lung cancer and ovarian cancer. She has a sister who had lip cancer and another sister with some form of uterine cancer. Multiple family members have schizophrenia and bipolar disorder. She has a maternal uncle and maternal grandfather who both had alcohol-induced cirrhosis.   SOCIAL HISTORY: She is an ex-smoker having quit in February of 2013, previously had quit for 14 years but resumed. Cumulative pack-year history is somewhere around 30 years. She is an Therapist, artex-alcoholic, last  known alcoholic drink was 2008.   REVIEW OF SYSTEMS: Positive for fatigue, weakness, pain, and weight gain. She states that she has gained 15 pounds over her ideal weight of 150 since her bypass. She denies any changes in vision. She denies any tinnitus or  ear pain. She has no history of recent sinus pain or difficulty swallowing. She denies any cough, wheezing, hemoptysis, or dyspnea. She denies chest pain, orthopnea. She does have chronic edema secondary to lymphedema. She does have chronic nausea with occasional vomiting and states she has vomited daily since admission due to multiple food intolerances. She does have chronic abdominal pain. 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 She has chronic dysuria and frequency. She has no history of polyuria or nocturia. She did have a history of diabetes which became resolved with gastric bypass. She has no history of rashes or lesions currently. No history of chronic neck, back, knee, shoulder, or hip pain. She does note numbness involving both hands and just right before she gets ready to vomit. She does have history of bipolar disorder and attention deficit disorder as well as depression.   PHYSICAL EXAMINATION:   GENERAL: This is a well nourished, well appearing female in no apparent distress.   VITAL SIGNS: Blood pressure 130/66, pulse 64 and regular, temperature 96.7, respirations 18.   HEENT: Pupils are equal, round, and reactive to light. Extraocular movements are intact. There is no scleral icterus. Oropharynx is benign.   NECK: Supple without lymphadenopathy, JVD, thyromegaly, or carotid bruits.   LUNGS: Clear to auscultation bilaterally with no wheezing or rhonchi.   CARDIOVASCULAR: Regular rate and rhythm with no murmurs, rubs, or gallops.   ABDOMEN: Soft with no organomegaly. She is nontender to palpation in all four quadrants.   GYN: Deferred.   MUSCULOSKELETAL: Notable for no joint effusions or joint pain.   SKIN: Warm and dry without lymphedema currently or erythema.   LYMPH: She has no cervical, axillary, inguinal, or supraclavicular lymphadenopathy.   NEUROLOGICAL: Grossly nonfocal.   PSYCHOLOGICAL: Notable for  somatization and anxiety but otherwise normal.    CURRENT LABORATORY DATA: Sodium 141, potassium 3.1, chloride 107, bicarb 34, BUN 12, creatinine 0.68, glucose on admission 129, now 89. Again, liver function tests notable for an ALT of 841 and now 526, AST 1603 and now 439, alkaline phosphatase 262 and now 250, total bilirubin 1.3 and now 0.7. TSH was slightly elevated at 5.95. CBC was normal with a white count of 4.4, hemoglobin 12.5, platelets 177. Urinalysis was positive for nitrates but there were 2 white blood cells and 2 red blood cells. PA and lateral chest x-ray is within normal limits.   ASSESSMENT AND PLAN:  1. Transaminitis. The patient had significant elevation of AST and ALT at 2:1 ratio on admission. Alkaline phosphatase was also slightly elevated. There are multiple, multiple drugs that could cause acute liver injury, however, since her enzymes are trending down overnight and have decreased by 50% this is likely due to an ingestion of something that has now been stopped. Given her prior use of antipsychotics, will check Tylenol, carbamazepine, Tegretol, valproic, and alcohol levels. Would not pursue anymore costly work-up unless liver enzymes remain elevated above normal but would consider ruling out hematochromatosis and fatty liver with iron studies and abdominal ultrasound if remain elevated.   We will follow along with you. Thank you very much for this interesting consult.   ESTIMATED TIME OF CARE: 60 minutes.  ____________________________ Duncan Dull, MD tt:drc D: 04/17/2011 16:25:33 ET T: 04/18/2011 10:51:05 ET JOB#: [id number]  cc: Duncan Dull, MD, <Dictator> Duncan Dull MD ELECTRONICALLY SIGNED 04/19/2011 5:57
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
## 2014-05-19 NOTE — H&P (Signed)
PATIENT NAME:  Vicki Thomas, Vicki Thomas MR#:  [id number] DATE OF BIRTH:  1960/06/11  DATE OF ADMISSION:  07/04/2011  IDENTIFYING INFORMATION AND CHIEF COMPLAINT: 54 year old woman brought to the Emergency Room by her son because of worsening depression, suicidal thoughts, thoughts about cutting herself, inability to take care of herself.   CHIEF COMPLAINT: "I thought about cutting myself."   HISTORY OF PRESENT ILLNESS: Information obtained from the patient and from the chart. Patient reports that for the past couple of months and she stopped getting ECT she has been off of all of her medicine. There is no particular reason for this. She seemed to have just not gotten it refilled or followed up with appropriate outpatient treatment. Her mood has been getting increasingly depressed. She feels tired, feels depressed, feels negative, cries frequently. Not able to take care of herself. Eating poorly. She has been having increasingly intense intrusive thoughts about hurting or cutting herself.   PAST PSYCHIATRIC HISTORY: Multiple prior hospitalizations over the years. Most recently she was in the hospital here back in March. She has a history of a diagnosis of bipolar disorder and gives a history of manic-like behavior decades ago but more recently has had primarily recurrent severe depressions with anxiety. She has been treated with multiple medications including multiple mood stabilizers and antidepressants. She has either been unable to tolerate them because of complaints of side effects or feels like they have not been of any benefit to her. The medication she appears to think has been most helpful for her has been Valium. She tends to focus on taking Valium or other benzodiazepines and narcotics as the most helpful treatment for her. Patient was receiving ECT just a couple of months ago. She has had multiple sessions of ECT and actually was getting what appeared to be clear benefit from it with improved mood, improved  energy, decreased anxiety. However, she stopped coming for follow-up ECT treatments. She is now stating that she thinks it caused severe memory loss and does not want to get it again.   SOCIAL HISTORY: Patient is living with one of her grown sons. She is not able to work. She is not in any long-term intimate relationship.   PAST MEDICAL HISTORY: Patient has multiple medical complaints. History of chronic pain all over her body. Unclear diagnosis. History of complaints of interstitial cystitis. History of inability to tolerate a large number of medications because of side effects.   REVIEW OF SYSTEMS: Patient complains of feeling depressed and feeling anxious. Memory is poor. Claims that she cannot remember things from the distant past or cannot remember any of the medicines that she has been taking. Her mood is described as being depressed and she has thoughts about cutting herself although does not think she is going to do it in the hospital. Denies any hallucinations. Says she is tired and has difficulty sleeping.   MENTAL STATUS EXAM: Somewhat disheveled woman decreased hygiene. Poor eye contact. Passively cooperative. Affect anxious. Mood stated as nervous and depressed. Speech is decreased in total amount. Thoughts are focused on her somatic complaints. No obvious gross delusions, no paranoia. Denies hallucinations. Denies suicidal intent. Says she does have thoughts of cutting but is not going to do it. Denies homicidal ideation. Impaired judgment and insight.   PHYSICAL EXAMINATION: GENERAL: Somewhat disheveled looking woman. Walks with a limp and indicates that she is feeling general pain all over.   SKIN: No acute skin lesions. No cuts.   HEENT: Pupils equal and reactive. Face  symmetric. Mucosa dry.   BACK: Tender throughout but especially low down on the right side.   NECK: Not tender to palpation.   MUSCULOSKELETAL: Decreased range of motion especially in the left leg. Cannot lift her  left leg got up independently. Upper extremities seem to have full range of motion. Strength is decreased in the right lower extremity. Reflexes symmetric in her arms. Face is symmetric. Cranial nerves symmetric.   LUNGS: Clear to auscultation with no extra sounds.   HEART: Regular rate and rhythm.   ABDOMEN: Soft, nontender, normal bowel sounds.   VITAL SIGNS: Current temperature 97.6, pulse 48, respirations 18, blood pressure 114/64.   LABORATORY, DIAGNOSTIC AND RADIOLOGICAL DATA: On admission her alcohol level was undetectable. TSH 0.88, which is normal. Chemistry showed an elevated alkaline phosphatase at 184, elevated chloride at 110. Drug screen positive for cannabis. CBC normal. Urinalysis is showing 3+ bacteria, but epithelial cells, only 2 white blood cells, negative leukocyte esterase.   ASSESSMENT: 54 year old woman with a long history of chronic recurrent medical problems and psychiatric problems with somatic focus. Has a history of depression and anxiety. Has not responded to any antidepressants or mood stabilizers consistently. Is back in the hospital reporting thoughts of hurting herself. Has been off of all of her medicine. Is refusing ECT which had previously been helpful to her.   TREATMENT PLAN: Restart previous pain medication. Minimize use of benzodiazepines. Engage patient in groups and activities on the unit. Not clear what medicines we would start for depression given her multiple medicine intolerances. Defer treatment for that for right now. Do daily supportive therapy. Do daily psychoeducation. Work on making sure she has a more comprehensive outpatient treatment plan in place.   DIAGNOSIS PRINCIPLE AND PRIMARY:  AXIS I: Depression, not otherwise specified.   SECONDARY DIAGNOSES:  AXIS I: Marijuana abuse.   AXIS II: Deferred but has histrionic features.   AXIS III: Chronic pain of unknown etiology.   AXIS IV: Moderate to severe from poor self care, burden of  illness.   AXIS V: Functioning at time of evaluation 35.  ____________________________ Audery Amel, MD jtc:cms D: 07/05/2011 18:23:19 ET T: 07/06/2011 06:23:00 ET  JOB#: [id number] cc: Audery Amel, MD, <Dictator> Audery Amel MD ELECTRONICALLY SIGNED 07/06/2011 11:30

## 2014-05-19 NOTE — Consult Note (Signed)
PATIENT NAME:  Vicki Thomas, Vicki Thomas MR#:  [id number] DATE OF BIRTH:  [birth year]  DATE OF CONSULTATION:  04/22/2011  REFERRING PHYSICIAN:  Antonietta Breach, MD CONSULTING PHYSICIAN:  Prentice Docker. DeFrancesco, MD  CHIEF COMPLAINT:  Chronic pelvic pain/vaginal pain. Vicki Thomas is a 54 year old single white female, para 2-0-0-2, menopausal since 1993 after total abdominal hysterectomy with bilateral salpingo-oophorectomy done secondary to chronic pelvic pain, on Premarin therapy 0.625 mg daily since that time for control of vasomotor symptoms, presents for evaluation of chronic vaginal burning-type pain. The patient describes her pain within the vaginal walls  and burning in nature. There are apparently no exacerbating or alleviating symptoms. She is not sexually active with intercourse for many months. Prior to surgical menopause she did have long history of severe dysmenorrhea and deep thrust dyspareunia. The patient does not recall pathology that was identified at the time of her hysterectomy. The total abdominal hysterectomy with bilateral salpingo-oophorectomy was done in 1993 in [place].   The patient has other chronic illnesses including fibromyalgia and interstitial cystitis for which she is being treated with chronic opioid therapies. She could not recall the names of her urologist or internist who were managing these conditions.   The patient denies chronic vaginal discharge. She denies vaginal odor. She denies any recent sexual contact.   PAST MEDICAL HISTORY:  1. Fibromyalgia.  2. Chronic fatigue syndrome.  3. Interstitial cystitis.  4. Lymphedema.  5. Surgical menopause.   PAST SURGICAL HISTORY:  1. Laparoscopic cholecystectomy.  2. Laparoscopic gastric reduction surgery (?).    3. Total abdominal hysterectomy, bilateral salpingo-oophorectomy, appendectomy.   PAST OB HISTORY: Para 2-0-0-2, G1, forceps delivery at term, G2 vacuum assisted delivery at term.   PAST GYN HISTORY: Menarche  age 35, menopause 65 with total abdominal hysterectomy/bilateral salpingo-oophorectomy done for chronic pelvic pain. Pathology unknown. The patient denies history of abnormal Pap smears. The patient reports long history of dysmenorrhea and deep thrust dyspareunia.   FAMILY HISTORY: No history of gynecologic malignancy.   SOCIAL HISTORY: Positive for tobacco use in the past. Denies alcohol use. Reports history of THC in the past.   DRUG ALLERGIES: Erythromycin, penicillin.   CURRENT MEDICATIONS:  1. Lasix 40 mg a day.   2. Potassium chloride 10 mEq q. day.  3. Premarin (likely 0.625 mg a day) (burgundy, football).    4. Robaxin 500 mg t.i.d.  5. Oxymorphone ER 20 mg b.i.d.  6. Amitiza 24 mcg b.i.d. for constipation.   REVIEW OF SYSTEMS: Chronic constipation with bowel movement frequency approximately twice a week. Remaining 14 point health systems updated as negative.   PHYSICAL EXAMINATION:    VITAL SIGNS: Weight 164 pounds, blood pressure 107/68, heart rate 75.   GENERAL: The patient is a pleasant, cooperative white female in no acute distress. She is oriented. She has some memory lapse with regard to physicians' name and locations of treatments she has had regarding her gynecologic and urologic care.   HEENT: Normocephalic, atraumatic. No exophthalmos. No nystagmus or lid lag.   NECK: Supple. There is no thyromegaly. Flank without CVA tenderness.   BREASTS: Not examined.   ABDOMEN: Soft, nontender. No hepatosplenomegaly. No pelvic masses. Multiple laparoscopy port site scars are seen in the upper abdomen. There is a Pfannenstiel incision in her lower abdomen from hysterectomy noted. No hernias are appreciated.   PELVIC: External genitalia normal. BUS notable for a yellow discharge noted at the urethral meatus. The urethra is nontender. Bladder nontender. Vagina notable for decreased estrogen effect. Minimal grayish-white  secretions are noted in the vaginal vault. The vault is  fairly well supported. Bimanual exam reveals tender vaginal sidewalls without palpable nodularity or mass. There is no adnexal mass or tenderness appreciated.   RECTOVAGINAL EXAM: There is nodular hard stool in the rectum as well as up near the vaginal cuff apex. This is consistent with a chronic constipation-type picture. Sphincter tone is normal.   IMPRESSION:  261. A 54 year old surgically menopausal female admitted with bipolar disorder with major depressive episode.    2. Surgically menopausal, status post a total abdominal hysterectomy/bilateral salpingo-oophorectomy; on adequate estrogen replacement therapy in the form of Premarin 0.625 mg q. day.   3. Chronic pain syndrome including history of fibromyalgia, interstitial cystitis, chronic pelvic pain with possible gynecologic component, slightly improved after total abdominal hysterectomy/bilateral salpingo-oophorectomy. Current vaginal pain of unclear etiology, but possibly  related to atrophy and/or monilia.  4. Chronic constipation.   PLAN:  1. Diflucan 150 mg p.o. x1 dose.  2. Consider trial of Vagifem therapy 10 mcg per vagina biweekly.  3. Continue with Premarin estrogen replacement therapy in the form of 0.625 mg q. day orally.  4. Consider treatment for chronic constipation including liberal use of MiraLAX; consider GI consultation for further options if this is unsuccessful or inadequate.           ADDENDUM NOTE: Wet prep was done of vaginal secretions revealing no significant white blood cells, no hyphae, no clue cells, no trichomonas.    ____________________________ Prentice DockerMartin A. DeFrancesco, MD mad:vtd D: 04/22/2011 17:19:19 ET T: 04/24/2011 07:14:00 ET JOB#: [id number]  cc: Daphine DeutscherMartin A. DeFrancesco, MD, <Dictator> Prentice DockerMARTIN A DEFRANCESCO MD ELECTRONICALLY SIGNED 04/24/2011 9:26
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
## 2014-05-19 NOTE — Consult Note (Signed)
PATIENT NAME:  Vicki Thomas, Vicki Thomas MR#:  [id number] DATE OF BIRTH:  [birth year]  DATE OF CONSULTATION:  04/17/2011  ADDENDUM:   CONSULTING PHYSICIAN:  Duncan Dulleresa Tullo, MD  ASSESSMENT AND PLAN:  2. Irritable bowel syndrome: Per patient, she has chronic constipation despite her history of gastric bypass and prior dumping syndrome. She apparently has had a trial of Amitiza in the past and is requesting that this be removed resumed. Her constipation may be due to her use of narcotics. I would continue Amitiza for right now to avoid overuse of laxatives. I will check a magnesium level. Her thyroid was slightly underactive on admission. This will need to be repeated in six weeks to rule out true hypothyroidism as since this may be a contributor to her constipation-predominate irritable bowel syndrome.  3. Interstitial cystitis: Again, per patient with prior evaluation and management by Dr. Jacquelyne BalintMcDermott in [place]. Her last urology evaluation was over six months ago, and per patient the bladder washes have failed to provide relief; and she is requesting a different urologist. This can be handled as an outpatient.  4. History of bipolar disorder: Management per Psychiatry.  5. Suicidal ideation secondary to severe depression: Per Psychiatry, she is planned to receive ECT on Monday morning.   ESTIMATED TIME OF CARE: 60 minutes.   ____________________________ Duncan Dulleresa Tullo, MD tt:cbb D: 04/17/2011 16:51:51 ET T: 04/18/2011 10:47:28 ET JOB#: [id number]  cc: Duncan Dulleresa Tullo, MD, <Dictator> Duncan DullERESA TULLO MD ELECTRONICALLY SIGNED 04/19/2011 5:57

## 2014-05-19 NOTE — H&P (Signed)
PATIENT NAME:  [name], Rainey MR#:  [id number] DATE OF BIRTH:  16-Jul-1960  DATE OF ADMISSION:  04/15/2011   This physical examination was conducted with female staff escort on 04/16/2011.   HEENT: Normocephalic, atraumatic. Pupils equally round and reactive to light and accommodation. Hearing intact bilaterally to finger rub. Oropharynx clear without erythema.   EXTREMITIES: No cyanosis, clubbing, or edema.   SKIN: Normal turgor. No rashes.   RESPIRATORY: No wheezing, rhonchi, or rales. Lungs are clear to auscultation.   CARDIOVASCULAR: Normal rate and rhythm. No murmurs, rubs, or gallops.   ABDOMEN: Bowel sounds positive, nontender.   GENITOURINARY: Deferred.   NEUROLOGIC: Cranial nerves II through XII intact. General sensory intact throughout to light touch. Muscle strength 5/5 throughout. Deep tendon reflexes normal strength and symmetry throughout with no Babinski. Coordination intact bilaterally by finger-to-nose testing.  ____________________________ Adelene AmasJames S. Musab Wingard, MD jsw:drc D: 04/17/2011 20:44:07 ET T: 04/18/2011 08:53:08 ET JOB#: [id number]  cc: Adelene AmasJames S. Aubreanna Percle, MD, <Dictator> Lester CarolinaJAMES S Karstyn Birkey MD ELECTRONICALLY SIGNED 04/19/2011 19:58

## 2014-05-19 NOTE — Consult Note (Signed)
Brief Consult Note: Diagnosis: bipolar disorder 1, depressed.   Patient was seen by consultant.   Recommend further assessment or treatment.   Orders entered.   Discussed with Attending MD.   Comments: ECT: Patient seen for ECT consult. Briefly, she has bipolar disorder and currently has symptoms of major depression. Has failed multiple meds both antidepressants and mood stabilzers. Severely impaired from illness. Pt counceled on nature and purpose of ECT including potential benifits and risks. She is agreeable to treatment. Will transfer to my service. ECT nurse notified. Workup ordered.  Electronic Signatures: Clapacs, Jackquline DenmarkJohn T (MD)  (Signed 22-Mar-13 14:45)  Authored: Brief Consult Note   Last Updated: 22-Mar-13 14:45 by Audery Amellapacs, John T (MD)

## 2014-05-19 NOTE — Consult Note (Signed)
Chief Complaint:   Subjective/Chief Complaint Elevated  LFTs   VITAL SIGNS/ANCILLARY NOTES: **Vital Signs.:   23-Mar-13 07:59   Vital Signs Type Routine   Temperature Temperature (F) 96.7   Celsius 35.9   Pulse Pulse 64   Respirations Respirations 18   Systolic BP Systolic BP 810   Diastolic BP (mmHg) Diastolic BP (mmHg) 69   Systolic BP Systolic BP 175   Diastolic BP (mmHg) Diastolic BP (mmHg) 66   Brief Assessment:   Cardiac Regular  no murmur  -- JVD    Respiratory normal resp effort  clear BS    Gastrointestinal details normal Soft  Nontender  Nondistended  No masses palpable  Bowel sounds normal  No rebound tenderness  No gaurding  No rigidity  No organomegaly     Routine Chem:  23-Mar-13 05:53    Glucose, Serum 89   BUN 12   Creatinine (comp) 0.68   Sodium, Serum 141   Potassium, Serum 3.7   Chloride, Serum 101   CO2, Serum 34   Calcium (Total), Serum 8.6   Anion Gap 6   Osmolality (calc) 280   eGFR (African American) >60   eGFR (Non-African American) >60  Hepatic:  23-Mar-13 05:53    Bilirubin, Total 0.7   Bilirubin, Direct 0.2   Alkaline Phosphatase 250   SGPT (ALT) 526   SGOT (AST) 439   Total Protein, Serum 6.2   Albumin, Serum 3.4  General Ref:  23-Mar-13 05:53    Acetaminophen, Serum 2  TDMs:  23-Mar-13 05:53    Tegretol Carbamazepine, Serum, Total < 0.5   Lithium, Serum < 0.20   Radiology Results: XRay:    22-Mar-13 22:53, Chest PA and Lateral   Chest PA and Lateral    REASON FOR EXAM:    ECT workup  COMMENTS:       PROCEDURE: DXR - DXR CHEST PA (OR AP) AND LATERAL  - Apr 16 2011 10:53PM     RESULT: The lungs are well-expanded. There is no focal infiltrate. The   cardiac silhouette is normal in size. The pulmonary vascularity is not   engorged. The perihilar lung markings are mildly prominent especially   inferiorly, bilaterally. I see no pleural effusion. The mediastinum is   normal in width. There are surgical clips in the left  upper quadrant of   the abdomen.    IMPRESSION:  I do not see evidence of CHF nor of pneumonia. I cannot   exclude acute bronchitis in the appropriate clinical setting.      Verified By: DAVID A. Martinique, M.D., MD   Assessment/Plan:  Invasive Device Daily Assessment of Necessity:   Does the patient currently have any of the following indwelling devices? none   Assessment/Plan:   Assessment Asked to see patient for elevated LFTS.  Please see didctated report #[id number];  1) Acute liver injury:  etiology not known but I suspect secondary to drug ingestion, although patient denies surreptitious use of ETOH, tylenol and prior antipsychotics .  The only family history of liver disease is alcohol induced cirrhosis.  Will check ETOH, tylenol, carbamezine, tegretol and lithium levels.  Rule out viral hepatitis. Unlikely  to be due to any of the meds she is currently taking since her enzyme levels have decreased to 50% overnight.  If they do not normalize will consider ruling out other causes including  hemachromatosis, autoimmune hepatitis and nonalcoholic steatosis (fatty liver/NASH) .  2) IBS:  per patient, with chronic constipation despite history  of gastric bypass,  requesting repeat trial of amitiza.  Would continue for now to avoid overuse of laxatives.  Will check Mg level .  TSH slightly elevated which will need to be repeated as hypothyrodism may be contributing.   3) Interstitial cystitis:  per patient , with prior evaluation by Dr. Vikki Ports in [place] . Per patient ,  bladder washes have failed to provide relief and she is requesting a second opinion, which can be done as outpatient locally.   4) Bipolar disorder :  per psychiatry.    Plan Thank you for this intedresting consult.   Electronic Signatures: Deborra Medina (MD)  (Signed 23-Mar-13 16:48)  Authored: Chief Complaint, VITAL SIGNS/ANCILLARY NOTES, Brief Assessment, Lab Results, Radiology Results, Assessment/Plan   Last Updated:  23-Mar-13 16:48 by Deborra Medina (MD)

## 2014-11-26 DIAGNOSIS — R32 Unspecified urinary incontinence: Secondary | ICD-10-CM | POA: Diagnosis not present

## 2014-12-02 DIAGNOSIS — G894 Chronic pain syndrome: Secondary | ICD-10-CM | POA: Diagnosis not present

## 2014-12-02 DIAGNOSIS — M792 Neuralgia and neuritis, unspecified: Secondary | ICD-10-CM | POA: Diagnosis not present

## 2014-12-02 DIAGNOSIS — M545 Low back pain: Secondary | ICD-10-CM | POA: Diagnosis not present

## 2014-12-02 DIAGNOSIS — Z79899 Other long term (current) drug therapy: Secondary | ICD-10-CM | POA: Diagnosis not present

## 2014-12-02 DIAGNOSIS — M25559 Pain in unspecified hip: Secondary | ICD-10-CM | POA: Diagnosis not present

## 2014-12-04 ENCOUNTER — Other Ambulatory Visit: Payer: Self-pay | Admitting: Pain Medicine

## 2014-12-04 DIAGNOSIS — M542 Cervicalgia: Secondary | ICD-10-CM

## 2014-12-04 DIAGNOSIS — M545 Low back pain: Secondary | ICD-10-CM

## 2014-12-18 DIAGNOSIS — G4700 Insomnia, unspecified: Secondary | ICD-10-CM | POA: Diagnosis not present

## 2014-12-18 DIAGNOSIS — F3130 Bipolar disorder, current episode depressed, mild or moderate severity, unspecified: Secondary | ICD-10-CM | POA: Diagnosis not present

## 2014-12-18 DIAGNOSIS — M5136 Other intervertebral disc degeneration, lumbar region: Secondary | ICD-10-CM | POA: Diagnosis not present

## 2014-12-18 DIAGNOSIS — F419 Anxiety disorder, unspecified: Secondary | ICD-10-CM | POA: Diagnosis not present

## 2014-12-24 DIAGNOSIS — M545 Low back pain: Secondary | ICD-10-CM | POA: Diagnosis not present

## 2014-12-24 DIAGNOSIS — M79609 Pain in unspecified limb: Secondary | ICD-10-CM | POA: Diagnosis not present

## 2014-12-26 DIAGNOSIS — R32 Unspecified urinary incontinence: Secondary | ICD-10-CM | POA: Diagnosis not present

## 2015-01-13 DIAGNOSIS — M79606 Pain in leg, unspecified: Secondary | ICD-10-CM | POA: Diagnosis not present

## 2015-01-13 DIAGNOSIS — M25559 Pain in unspecified hip: Secondary | ICD-10-CM | POA: Diagnosis not present

## 2015-01-13 DIAGNOSIS — G894 Chronic pain syndrome: Secondary | ICD-10-CM | POA: Diagnosis not present

## 2015-01-13 DIAGNOSIS — M545 Low back pain: Secondary | ICD-10-CM | POA: Diagnosis not present

## 2015-01-13 DIAGNOSIS — Z79899 Other long term (current) drug therapy: Secondary | ICD-10-CM | POA: Diagnosis not present

## 2015-01-14 DIAGNOSIS — F4310 Post-traumatic stress disorder, unspecified: Secondary | ICD-10-CM | POA: Diagnosis not present

## 2015-01-14 DIAGNOSIS — F419 Anxiety disorder, unspecified: Secondary | ICD-10-CM | POA: Diagnosis not present

## 2015-01-14 DIAGNOSIS — R32 Unspecified urinary incontinence: Secondary | ICD-10-CM | POA: Diagnosis not present

## 2015-01-14 DIAGNOSIS — R7989 Other specified abnormal findings of blood chemistry: Secondary | ICD-10-CM | POA: Diagnosis not present

## 2015-01-14 DIAGNOSIS — E162 Hypoglycemia, unspecified: Secondary | ICD-10-CM | POA: Diagnosis not present

## 2015-01-14 DIAGNOSIS — N94819 Vulvodynia, unspecified: Secondary | ICD-10-CM | POA: Diagnosis not present

## 2015-01-14 DIAGNOSIS — G43809 Other migraine, not intractable, without status migrainosus: Secondary | ICD-10-CM | POA: Diagnosis not present

## 2015-01-14 DIAGNOSIS — Z79899 Other long term (current) drug therapy: Secondary | ICD-10-CM | POA: Diagnosis not present

## 2015-01-26 DIAGNOSIS — R32 Unspecified urinary incontinence: Secondary | ICD-10-CM | POA: Diagnosis not present

## 2015-02-06 DIAGNOSIS — M5412 Radiculopathy, cervical region: Secondary | ICD-10-CM | POA: Diagnosis not present

## 2015-02-06 DIAGNOSIS — R200 Anesthesia of skin: Secondary | ICD-10-CM | POA: Diagnosis not present

## 2015-02-11 DIAGNOSIS — M5136 Other intervertebral disc degeneration, lumbar region: Secondary | ICD-10-CM | POA: Diagnosis not present

## 2015-02-11 DIAGNOSIS — F419 Anxiety disorder, unspecified: Secondary | ICD-10-CM | POA: Diagnosis not present

## 2015-04-02 ENCOUNTER — Emergency Department (HOSPITAL_COMMUNITY)
Admission: EM | Admit: 2015-04-02 | Discharge: 2015-04-02 | Disposition: A | Discharge status: Home / Self Care | Payer: Medicare Other | Attending: Emergency Medicine | Admitting: Emergency Medicine

## 2015-04-02 ENCOUNTER — Encounter (HOSPITAL_COMMUNITY): Payer: Self-pay | Admitting: Emergency Medicine

## 2015-04-02 ENCOUNTER — Emergency Department (HOSPITAL_COMMUNITY): Payer: Medicare Other

## 2015-04-02 DIAGNOSIS — F319 Bipolar disorder, unspecified: Secondary | ICD-10-CM | POA: Insufficient documentation

## 2015-04-02 DIAGNOSIS — E876 Hypokalemia: Secondary | ICD-10-CM | POA: Insufficient documentation

## 2015-04-02 DIAGNOSIS — J40 Bronchitis, not specified as acute or chronic: Secondary | ICD-10-CM

## 2015-04-02 DIAGNOSIS — Z87891 Personal history of nicotine dependence: Secondary | ICD-10-CM | POA: Insufficient documentation

## 2015-04-02 DIAGNOSIS — M549 Dorsalgia, unspecified: Secondary | ICD-10-CM | POA: Insufficient documentation

## 2015-04-02 DIAGNOSIS — Z79899 Other long term (current) drug therapy: Secondary | ICD-10-CM | POA: Diagnosis not present

## 2015-04-02 DIAGNOSIS — R0602 Shortness of breath: Secondary | ICD-10-CM | POA: Diagnosis present

## 2015-04-02 LAB — BASIC METABOLIC PANEL
Anion gap: 6 (ref 5–15)
BUN: 9 mg/dL (ref 6–20)
CO2: 23 mmol/L (ref 22–32)
Calcium: 7.8 mg/dL — ABNORMAL LOW (ref 8.9–10.3)
Chloride: 112 mmol/L — ABNORMAL HIGH (ref 101–111)
Creatinine, Ser: 0.61 mg/dL (ref 0.44–1.00)
GFR calc Af Amer: >60 mL/min (ref >60)
GFR calc non Af Amer: >60 mL/min (ref >60)
GLUCOSE: 84 mg/dL (ref 65–99)
POTASSIUM: 2.7 mmol/L — AB (ref 3.5–5.1)
Sodium: 141 mmol/L (ref 135–145)

## 2015-04-02 LAB — CBC WITH DIFFERENTIAL/PLATELET
BASOS ABS: 0 10*3/uL (ref 0.0–0.1)
Basophils Relative: 0 %
Eosinophils Absolute: 0.1 10*3/uL (ref 0.0–0.7)
Eosinophils Relative: 2 %
HCT: 34.4 % — ABNORMAL LOW (ref 36.0–46.0)
HEMOGLOBIN: 11.2 g/dL — AB (ref 12.0–15.0)
LYMPHS ABS: 2.5 10*3/uL (ref 0.7–4.0)
Lymphocytes Relative: 49 %
MCH: 30 pg (ref 26.0–34.0)
MCHC: 32.6 g/dL (ref 30.0–36.0)
MCV: 92.2 fL (ref 78.0–100.0)
Monocytes Absolute: 0.5 10*3/uL (ref 0.1–1.0)
Monocytes Relative: 9 %
NEUTROS ABS: 2.1 10*3/uL (ref 1.7–7.7)
NEUTROS PCT: 40 %
Platelets: 162 10*3/uL (ref 150–400)
RBC: 3.73 MIL/uL — AB (ref 3.87–5.11)
RDW: 15.4 % (ref 11.5–15.5)
WBC: 5.2 10*3/uL (ref 4.0–10.5)

## 2015-04-02 LAB — POTASSIUM: Potassium: 3.9 mmol/L (ref 3.5–5.1)

## 2015-04-02 LAB — TROPONIN I

## 2015-04-02 MED ORDER — POTASSIUM CHLORIDE 10 MEQ/100ML IV SOLN
INTRAVENOUS | Status: AC
  Filled 2015-04-02: qty 100

## 2015-04-02 MED ORDER — MAGNESIUM SULFATE IN D5W 10-5 MG/ML-% IV SOLN
INTRAVENOUS | Status: AC
  Filled 2015-04-02: qty 100

## 2015-04-02 MED ORDER — DOXYCYCLINE HYCLATE 100 MG PO CAPS: 100.0000 mg | ORAL_CAPSULE | Freq: Two times a day (BID) | ORAL | Status: DC

## 2015-04-02 MED ORDER — MAGNESIUM SULFATE IN D5W 10-5 MG/ML-% IV SOLN
1.0000 g | Freq: Once | INTRAVENOUS | Status: AC
  Administered 2015-04-02: 1 g via INTRAVENOUS

## 2015-04-02 MED ORDER — IPRATROPIUM-ALBUTEROL 0.5-2.5 (3) MG/3ML IN SOLN
3.0000 mL | Freq: Once | RESPIRATORY_TRACT | Status: AC
  Administered 2015-04-02: 3 mL via RESPIRATORY_TRACT
  Filled 2015-04-02: qty 3

## 2015-04-02 MED ORDER — POTASSIUM CHLORIDE CRYS ER 20 MEQ PO TBCR
40.0000 meq | EXTENDED_RELEASE_TABLET | Freq: Once | ORAL | Status: AC
  Administered 2015-04-02: 40 meq via ORAL

## 2015-04-02 MED ORDER — POTASSIUM CHLORIDE ER 10 MEQ PO TBCR: 10.0000 meq | EXTENDED_RELEASE_TABLET | Freq: Every day | ORAL | Status: DC

## 2015-04-02 MED ORDER — IPRATROPIUM BROMIDE HFA 17 MCG/ACT IN AERS: 2.0000 | INHALATION_SPRAY | RESPIRATORY_TRACT | Status: DC | PRN

## 2015-04-02 MED ORDER — DEXAMETHASONE SODIUM PHOSPHATE 4 MG/ML IJ SOLN
10.0000 mg | Freq: Once | INTRAMUSCULAR | Status: AC
  Administered 2015-04-02: 10 mg via INTRAVENOUS
  Filled 2015-04-02: qty 3

## 2015-04-02 MED ORDER — POTASSIUM CHLORIDE 10 MEQ/100ML IV SOLN
10.0000 meq | INTRAVENOUS | Status: AC
  Administered 2015-04-02 (×2): 10 meq via INTRAVENOUS

## 2015-04-02 MED ORDER — SODIUM CHLORIDE 0.9 % IV BOLUS (SEPSIS)
1000.0000 mL | Freq: Once | INTRAVENOUS | Status: AC
  Administered 2015-04-02: 1000 mL via INTRAVENOUS

## 2015-04-02 MED ORDER — POTASSIUM CHLORIDE CRYS ER 20 MEQ PO TBCR
EXTENDED_RELEASE_TABLET | ORAL | Status: AC
  Filled 2015-04-02: qty 2

## 2015-04-02 NOTE — ED Provider Notes (Signed)
CSN: [id number]     Arrival date & time 04/02/15  0116 History   First MD Initiated Contact with Patient 04/02/15 0126     Chief Complaint  Patient presents with  . Shortness of Breath     (Consider location/radiation/quality/duration/timing/severity/associated sxs/prior Treatment) HPI  This a 55 year old female who presents with cough and shortness of breath. Patient reports symptoms since February 28. Reports chills without fever. States the cough is productive. Reports shortness of breath. Denies any chest pain or lower extremity swelling.  Denies any nausea, vomiting, diarrhea. No known sick contacts. She is a smoker but no history of COPD. States that she used her grandsons nebulizer with some relief. She is concerned she has pneumonia.  Past Medical History  Diagnosis Date  . Interstitial cystitis   . Fibromyalgia   . Chronic fatigue   . Arthritis   . Bipolar 1 disorder (HCC)   . PTSD (post-traumatic stress disorder)   . Lymphedema   . Panic disorder with agoraphobia   . Chronic pain   . Hiatal hernia   . Chronic headaches   . Chronic back pain   . Anxiety   . History of borderline personality disorder    Past Surgical History  Procedure Laterality Date  . Cholecystectomy    . Abdominal hysterectomy    . Tonsillectomy    . Foot surgery    . Bariatric surgery    . Hemorrhoid surgery    . Hernia repair    . Esophagus surgery    . Appendectomy     Family History  Problem Relation Age of Onset  . Alcohol abuse Mother   . Bipolar disorder Father   . Paranoid behavior Father   . Alcohol abuse Father   . Bipolar disorder Sister   . Anxiety disorder Sister   . Alcohol abuse Brother   . Drug abuse Brother   . Bipolar disorder Paternal Aunt   . Schizophrenia Maternal Grandfather   . Anxiety disorder Maternal Aunt    Social History  Substance Use Topics  . Smoking status: Former Smoker -- 1.00 packs/day for 6 years  . Smokeless tobacco: Never Used  . Alcohol  Use: No   OB History    No data available     Review of Systems  Constitutional: Positive for chills. Negative for fever.  Respiratory: Positive for cough and shortness of breath.   Cardiovascular: Negative for chest pain and leg swelling.  Gastrointestinal: Negative for nausea, vomiting and abdominal pain.  Skin: Negative for rash.  Psychiatric/Behavioral: The patient is nervous/anxious.   All other systems reviewed and are negative.     Allergies  Bee venom; Erythromycin; Haldol; Penicillins; Abilify; Aripiprazole; Ciprocin-fluocin-procin; Fexofenadine hcl; Gabapentin; Iodine; Latuda; Lithium; Marcaine; Paroxetine hcl; Pregabalin; Prozac; Remeron; Seroquel; Tape; Zofran; and Zonisamide  Home Medications   Prior to Admission medications   Medication Sig Start Date End Date Taking? Authorizing Provider  DULoxetine (CYMBALTA) 60 MG capsule Take 1 capsule (60 mg total) by mouth daily. 01/20/14  Yes Thermon LeylandLaura A Davis, NP  esomeprazole (NEXIUM) 40 MG capsule Take 1 capsule (40 mg total) by mouth daily. 01/20/14  Yes Thermon LeylandLaura A Davis, NP  doxycycline (VIBRAMYCIN) 100 MG capsule Take 1 capsule (100 mg total) by mouth 2 (two) times daily. 04/02/15   Shon Batonourtney F Horton, MD  ibuprofen (ADVIL,MOTRIN) 200 MG tablet Take 400 mg by mouth every 8 (eight) hours as needed. pain    Historical Provider, MD  ipratropium (ATROVENT HFA) 17 MCG/ACT inhaler  Inhale 2 puffs into the lungs every 4 (four) hours as needed (wheeze). 04/02/15   Shon Baton, MD  lubiprostone (AMITIZA) 24 MCG capsule Take 1 capsule (24 mcg total) by mouth 2 (two) times daily with a meal. 01/20/14   Thermon Leyland, NP  multivitamin-lutein [facility]) CAPS capsule Take 1 capsule by mouth daily. 01/20/14   Thermon Leyland, NP  OLANZapine (ZYPREXA) 5 MG tablet Take 1 tablet (5 mg total) by mouth at bedtime. 01/20/14   Thermon Leyland, NP  OXcarbazepine (TRILEPTAL) 150 MG tablet Take 1 tablet (150 mg total) by mouth 2 (two) times  daily. Patient taking differently: Take 150 mg by mouth 2 (two) times daily. Take one in the a.m. And two at hs. 01/20/14   Thermon Leyland, NP  potassium chloride (K-DUR) 10 MEQ tablet Take 1 tablet (10 mEq total) by mouth daily. 04/02/15   Shon Baton, MD  traZODone (DESYREL) 50 MG tablet Take 1 tablet (50 mg total) by mouth at bedtime and may repeat dose one time if needed. 01/20/14   Thermon Leyland, NP  zolpidem (AMBIEN) 5 MG tablet Take 1 tablet (5 mg total) by mouth at bedtime as needed for sleep. 02/06/14   Benjaman Pott, MD   BP 97/61 mmHg  Pulse 47  Temp(Src) 97.7 F (36.5 C) (Oral)  Resp 14  Ht  (1.727 m)  Wt 178 lb (80.74 kg)  BMI 27.07 kg/m2  SpO2 96% Physical Exam  Constitutional: She is oriented to person, place, and time. No distress.  Anxious appearing, no acute distress  HENT:  Head: Normocephalic and atraumatic.  Cardiovascular: Normal rate, regular rhythm and normal heart sounds.   No murmur heard. Pulmonary/Chest: Effort normal. No respiratory distress. She has wheezes. She exhibits no tenderness.  Abdominal: Soft. Bowel sounds are normal. There is no tenderness. There is no rebound.  Musculoskeletal: She exhibits no edema.  Neurological: She is alert and oriented to person, place, and time.  Skin: Skin is warm and dry.  Psychiatric: She has a normal mood and affect.  Nursing note and vitals reviewed.   ED Course  Procedures (including critical care time) Labs Review Labs Reviewed  CBC WITH DIFFERENTIAL/PLATELET - Abnormal; Notable for the following:    RBC 3.73 (*)    Hemoglobin 11.2 (*)    HCT 34.4 (*)    All other components within normal limits  BASIC METABOLIC PANEL - Abnormal; Notable for the following:    Potassium 2.7 (*)    Chloride 112 (*)    Calcium 7.8 (*)    All other components within normal limits  TROPONIN I  POTASSIUM    Imaging Review Dg Chest 2 View  04/02/2015  CLINICAL DATA:  Cough and shortness of breath for 1 week.  EXAM: CHEST  2 VIEW COMPARISON:  01/21/2014 FINDINGS: The cardiomediastinal contours are normal. The lungs are clear. Pulmonary vasculature is normal. No consolidation, pleural effusion, or pneumothorax. No acute osseous abnormalities are seen. Surgical clips in the left upper quadrant of the abdomen. IMPRESSION: No active cardiopulmonary disease. Electronically Signed   By: Rubye Oaks M.D.   On: 04/02/2015 02:23   I have personally reviewed and evaluated these images and lab results as part of my medical decision-making.   EKG Interpretation   Date/Time:  Wednesday April 02 2015 01:35:13 EST Ventricular Rate:  53 PR Interval:  154 QRS Duration: 86 QT Interval:  447 QTC Calculation: 420 R Axis:   78  Text Interpretation:  Sinus rhythm Abnormal R-wave progression, early  transition Confirmed by HORTON  MD, COURTNEY ([id number]) on 04/02/2015 3:06:31  AM      MDM   Final diagnoses:  Bronchitis  Hypokalemia    Patient presents with cough and shortness of breath. She is a smoker. No fevers. Wheezing on exam but otherwise nontoxic. Satting 99% on room air. Afebrile. EKG reassuring. Chest x-ray without evidence of pneumonia.  Basic labwork obtained. No leukocytosis. Potassium 2.7. Patient was given 2 runs of potassium as well as IV magnesium and oral potassium. She was also given Decadron given her history of COPD and wheezing on exam and a DuoNeb. Suspect some underlying COPD that is undiagnosed.  Patient has rested comfortably in the ER. Not requiring supplemental oxygen. Ambulated without difficulty and maintained her oxygen saturations. Repeat potassium 3.9. She will need to follow-up with her primary physician for potassium recheck in one week. We'll place on 10 mEq daily until that time. Patient will also be given an albuterol inhaler and doxycycline for acute bronchitis.  After history, exam, and medical workup I feel the patient has been appropriately medically screened and is safe for  discharge home. Pertinent diagnoses were discussed with the patient. Patient was given return precautions.     Shon Baton, MD 04/02/15 [phone]

## 2015-04-02 NOTE — ED Notes (Signed)
CRITICAL VALUE ALERT  Critical value received: Potassium 2.7  Date of notification:  04/02/2015  Time of notification:  0242  Critical value read back:Yes.    Nurse who received alert:  Clarene Reamerasey Jaimya Feliciano  MD notified (1st page):  Horton  Time of first page:  [phone]

## 2015-04-02 NOTE — ED Notes (Signed)
Patient EKG done and given to Dr Wilkie AyeHorton. Patient on cardiac monitor.

## 2015-04-02 NOTE — Discharge Instructions (Signed)
Hypokalemia °Hypokalemia means that the amount of potassium in the blood is lower than normal. Potassium is a chemical, called an electrolyte, that helps regulate the amount of fluid in the body. It also stimulates muscle contraction and helps nerves function properly. Most of the body's potassium is inside of cells, and only a very small amount is in the blood. Because the amount in the blood is so small, minor changes can be life-threatening. °CAUSES °· Antibiotics. °· Diarrhea or vomiting. °· Using laxatives too much, which can cause diarrhea. °· Chronic kidney disease. °· Water pills (diuretics). °· Eating disorders (bulimia). °· Low magnesium level. °· Sweating a lot. °SIGNS AND SYMPTOMS °· Weakness. °· Constipation. °· Fatigue. °· Muscle cramps. °· Mental confusion. °· Skipped heartbeats or irregular heartbeat (palpitations). °· Tingling or numbness. °DIAGNOSIS  °Your health care provider can diagnose hypokalemia with blood tests. In addition to checking your potassium level, your health care provider may also check other lab tests. °TREATMENT °Hypokalemia can be treated with potassium supplements taken by mouth or adjustments in your current medicines. If your potassium level is very low, you may need to get potassium through a vein (IV) and be monitored in the hospital. A diet high in potassium is also helpful. Foods high in potassium are: °· Nuts, such as peanuts and pistachios. °· Seeds, such as sunflower seeds and pumpkin seeds. °· Peas, lentils, and lima beans. °· Whole grain and bran cereals and breads. °· Fresh fruit and vegetables, such as apricots, avocado, bananas, cantaloupe, kiwi, oranges, tomatoes, asparagus, and potatoes. °· Orange and tomato juices. °· Red meats. °· Fruit yogurt. °HOME CARE INSTRUCTIONS °· Take all medicines as prescribed by your health care provider. °· Maintain a healthy diet by including nutritious food, such as fruits, vegetables, nuts, whole grains, and lean meats. °· If  you are taking a laxative, be sure to follow the directions on the label. °SEEK MEDICAL CARE IF: °· Your weakness gets worse. °· You feel your heart pounding or racing. °· You are vomiting or having diarrhea. °· You are diabetic and having trouble keeping your blood glucose in the normal range. °SEEK IMMEDIATE MEDICAL CARE IF: °· You have chest pain, shortness of breath, or dizziness. °· You are vomiting or having diarrhea for more than 2 days. °· You faint. °MAKE SURE YOU:  °· Understand these instructions. °· Will watch your condition. °· Will get help right away if you are not doing well or get worse. °  °This information is not intended to replace advice given to you by your health care provider. Make sure you discuss any questions you have with your health care provider. °  °Document Released: 01/11/2005 Document Revised: 02/01/2014 Document Reviewed: 07/14/2012 °Elsevier Interactive Patient Education ©2016 [employer] °Acute Bronchitis °Bronchitis is inflammation of the airways that extend from the windpipe into the lungs (bronchi). The inflammation often causes mucus to develop. This leads to a cough, which is the most common symptom of bronchitis.  °In acute bronchitis, the condition usually develops suddenly and goes away over time, usually in a couple weeks. Smoking, allergies, and asthma can make bronchitis worse. Repeated episodes of bronchitis may cause further lung problems.  °CAUSES °Acute bronchitis is most often caused by the same virus that causes a cold. The virus can spread from person to person (contagious) through coughing, sneezing, and touching contaminated objects. °SIGNS AND SYMPTOMS  °· Cough.   °· Fever.   °· Coughing up mucus.   °· Body aches.   °· Chest congestion.   °·   Chills.   °· Shortness of breath.   °· Sore throat.   °DIAGNOSIS  °Acute bronchitis is usually diagnosed through a physical exam. Your health care provider will also ask you questions about your medical history. Tests,  such as chest X-rays, are sometimes done to rule out other conditions.  °TREATMENT  °Acute bronchitis usually goes away in a couple weeks. Oftentimes, no medical treatment is necessary. Medicines are sometimes given for relief of fever or cough. Antibiotic medicines are usually not needed but may be prescribed in certain situations. In some cases, an inhaler may be recommended to help reduce shortness of breath and control the cough. A cool mist vaporizer may also be used to help thin bronchial secretions and make it easier to clear the chest.  °HOME CARE INSTRUCTIONS °· Get plenty of rest.   °· Drink enough fluids to keep your urine clear or pale yellow (unless you have a medical condition that requires fluid restriction). Increasing fluids may help thin your respiratory secretions (sputum) and reduce chest congestion, and it will prevent dehydration.   °· Take medicines only as directed by your health care provider. °· If you were prescribed an antibiotic medicine, finish it all even if you start to feel better. °· Avoid smoking and secondhand smoke. Exposure to cigarette smoke or irritating chemicals will make bronchitis worse. If you are a smoker, consider using nicotine gum or skin patches to help control withdrawal symptoms. Quitting smoking will help your lungs heal faster.   °· Reduce the chances of another bout of acute bronchitis by washing your hands frequently, avoiding people with cold symptoms, and trying not to touch your hands to your mouth, nose, or eyes.   °· Keep all follow-up visits as directed by your health care provider.   °SEEK MEDICAL CARE IF: °Your symptoms do not improve after 1 week of treatment.  °SEEK IMMEDIATE MEDICAL CARE IF: °· You develop an increased fever or chills.   °· You have chest pain.   °· You have severe shortness of breath. °· You have bloody sputum.   °· You develop dehydration. °· You faint or repeatedly feel like you are going to pass out. °· You develop repeated  vomiting. °· You develop a severe headache. °MAKE SURE YOU:  °· Understand these instructions. °· Will watch your condition. °· Will get help right away if you are not doing well or get worse. °  °This information is not intended to replace advice given to you by your health care provider. Make sure you discuss any questions you have with your health care provider. °  °Document Released: 02/19/2004 Document Revised: 02/01/2014 Document Reviewed: 07/04/2012 °Elsevier Interactive Patient Education ©2016 [employer] ° °

## 2015-04-02 NOTE — ED Notes (Signed)
Pt was sleeping upon entering room.  Pt awoke while in room and started sitting up rocking back and forth repeating "no no leave me alone, there's too much going on".  Made Dr. Wilkie AyeHorton aware

## 2015-04-02 NOTE — ED Notes (Signed)
Patient assisted to restroom and returned to room.

## 2015-04-02 NOTE — ED Notes (Signed)
Ambulated around nurses station.  Pt c/o dizziness.  O2 saturation remained between 98-100%

## 2015-04-02 NOTE — ED Notes (Signed)
Pt c/o sob since 03/25/15. Pt is tearful in the room.

## 2015-09-24 DIAGNOSIS — G894 Chronic pain syndrome: Secondary | ICD-10-CM | POA: Diagnosis not present

## 2015-11-04 DIAGNOSIS — E559 Vitamin D deficiency, unspecified: Secondary | ICD-10-CM | POA: Diagnosis not present

## 2015-11-04 DIAGNOSIS — J3089 Other allergic rhinitis: Secondary | ICD-10-CM | POA: Diagnosis not present

## 2015-11-04 DIAGNOSIS — M797 Fibromyalgia: Secondary | ICD-10-CM | POA: Diagnosis not present

## 2015-11-04 DIAGNOSIS — Z79899 Other long term (current) drug therapy: Secondary | ICD-10-CM | POA: Diagnosis not present

## 2015-11-04 DIAGNOSIS — E1165 Type 2 diabetes mellitus with hyperglycemia: Secondary | ICD-10-CM | POA: Diagnosis not present

## 2015-12-01 DIAGNOSIS — J3089 Other allergic rhinitis: Secondary | ICD-10-CM | POA: Diagnosis not present

## 2015-12-01 DIAGNOSIS — J208 Acute bronchitis due to other specified organisms: Secondary | ICD-10-CM | POA: Diagnosis not present

## 2015-12-01 DIAGNOSIS — E1165 Type 2 diabetes mellitus with hyperglycemia: Secondary | ICD-10-CM | POA: Diagnosis not present

## 2015-12-01 DIAGNOSIS — M797 Fibromyalgia: Secondary | ICD-10-CM | POA: Diagnosis not present

## 2015-12-10 ENCOUNTER — Encounter: Payer: Self-pay | Admitting: Physical Medicine & Rehabilitation

## 2015-12-10 ENCOUNTER — Encounter: Payer: Medicare Other | Attending: Physical Medicine & Rehabilitation | Admitting: Physical Medicine & Rehabilitation

## 2015-12-10 VITALS — BP 118/83 | HR 75 | Resp 14

## 2015-12-10 DIAGNOSIS — E669 Obesity, unspecified: Secondary | ICD-10-CM | POA: Insufficient documentation

## 2015-12-10 DIAGNOSIS — Z79899 Other long term (current) drug therapy: Secondary | ICD-10-CM

## 2015-12-10 DIAGNOSIS — M797 Fibromyalgia: Secondary | ICD-10-CM | POA: Insufficient documentation

## 2015-12-10 DIAGNOSIS — M792 Neuralgia and neuritis, unspecified: Secondary | ICD-10-CM | POA: Insufficient documentation

## 2015-12-10 DIAGNOSIS — M5441 Lumbago with sciatica, right side: Secondary | ICD-10-CM | POA: Diagnosis not present

## 2015-12-10 DIAGNOSIS — F513 Sleepwalking [somnambulism]: Secondary | ICD-10-CM | POA: Diagnosis not present

## 2015-12-10 DIAGNOSIS — G629 Polyneuropathy, unspecified: Secondary | ICD-10-CM | POA: Insufficient documentation

## 2015-12-10 DIAGNOSIS — N94819 Vulvodynia, unspecified: Secondary | ICD-10-CM | POA: Diagnosis not present

## 2015-12-10 DIAGNOSIS — Z5181 Encounter for therapeutic drug level monitoring: Secondary | ICD-10-CM

## 2015-12-10 DIAGNOSIS — F603 Borderline personality disorder: Secondary | ICD-10-CM | POA: Insufficient documentation

## 2015-12-10 DIAGNOSIS — I890 Lymphedema, not elsewhere classified: Secondary | ICD-10-CM | POA: Insufficient documentation

## 2015-12-10 DIAGNOSIS — G479 Sleep disorder, unspecified: Secondary | ICD-10-CM | POA: Insufficient documentation

## 2015-12-10 DIAGNOSIS — F4310 Post-traumatic stress disorder, unspecified: Secondary | ICD-10-CM | POA: Insufficient documentation

## 2015-12-10 DIAGNOSIS — R51 Headache: Secondary | ICD-10-CM | POA: Insufficient documentation

## 2015-12-10 DIAGNOSIS — M545 Low back pain: Secondary | ICD-10-CM | POA: Insufficient documentation

## 2015-12-10 DIAGNOSIS — F319 Bipolar disorder, unspecified: Secondary | ICD-10-CM | POA: Diagnosis not present

## 2015-12-10 DIAGNOSIS — G8929 Other chronic pain: Secondary | ICD-10-CM | POA: Diagnosis not present

## 2015-12-10 MED ORDER — MELOXICAM 15 MG PO TABS: 15.0000 mg | ORAL_TABLET | Freq: Every day | ORAL | 1 refills | Status: DC

## 2015-12-10 MED ORDER — METHOCARBAMOL 500 MG PO TABS: 1000.0000 mg | ORAL_TABLET | Freq: Three times a day (TID) | ORAL | 1 refills | Status: DC | PRN

## 2015-12-10 NOTE — Addendum Note (Signed)
Addended by: Doreene ElandSHUMAKER, Callaghan Laverdure W on: 12/10/2015 03:50 PM   Modules accepted: Orders
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
## 2015-12-14 ENCOUNTER — Encounter: Payer: Self-pay | Admitting: Physical Medicine & Rehabilitation

## 2015-12-16 LAB — TOXASSURE SELECT,+ANTIDEPR,UR

## 2015-12-17 IMAGING — CR DG CHEST 1V PORT
1 series · 1 of 1 positions shown · non-contrast
Comparison: Prior radiograph from 12/19/2013

CLINICAL DATA: Initial valuation for altered mental status,
respiratory distress.

EXAM:
PORTABLE CHEST - 1 VIEW

[portable]
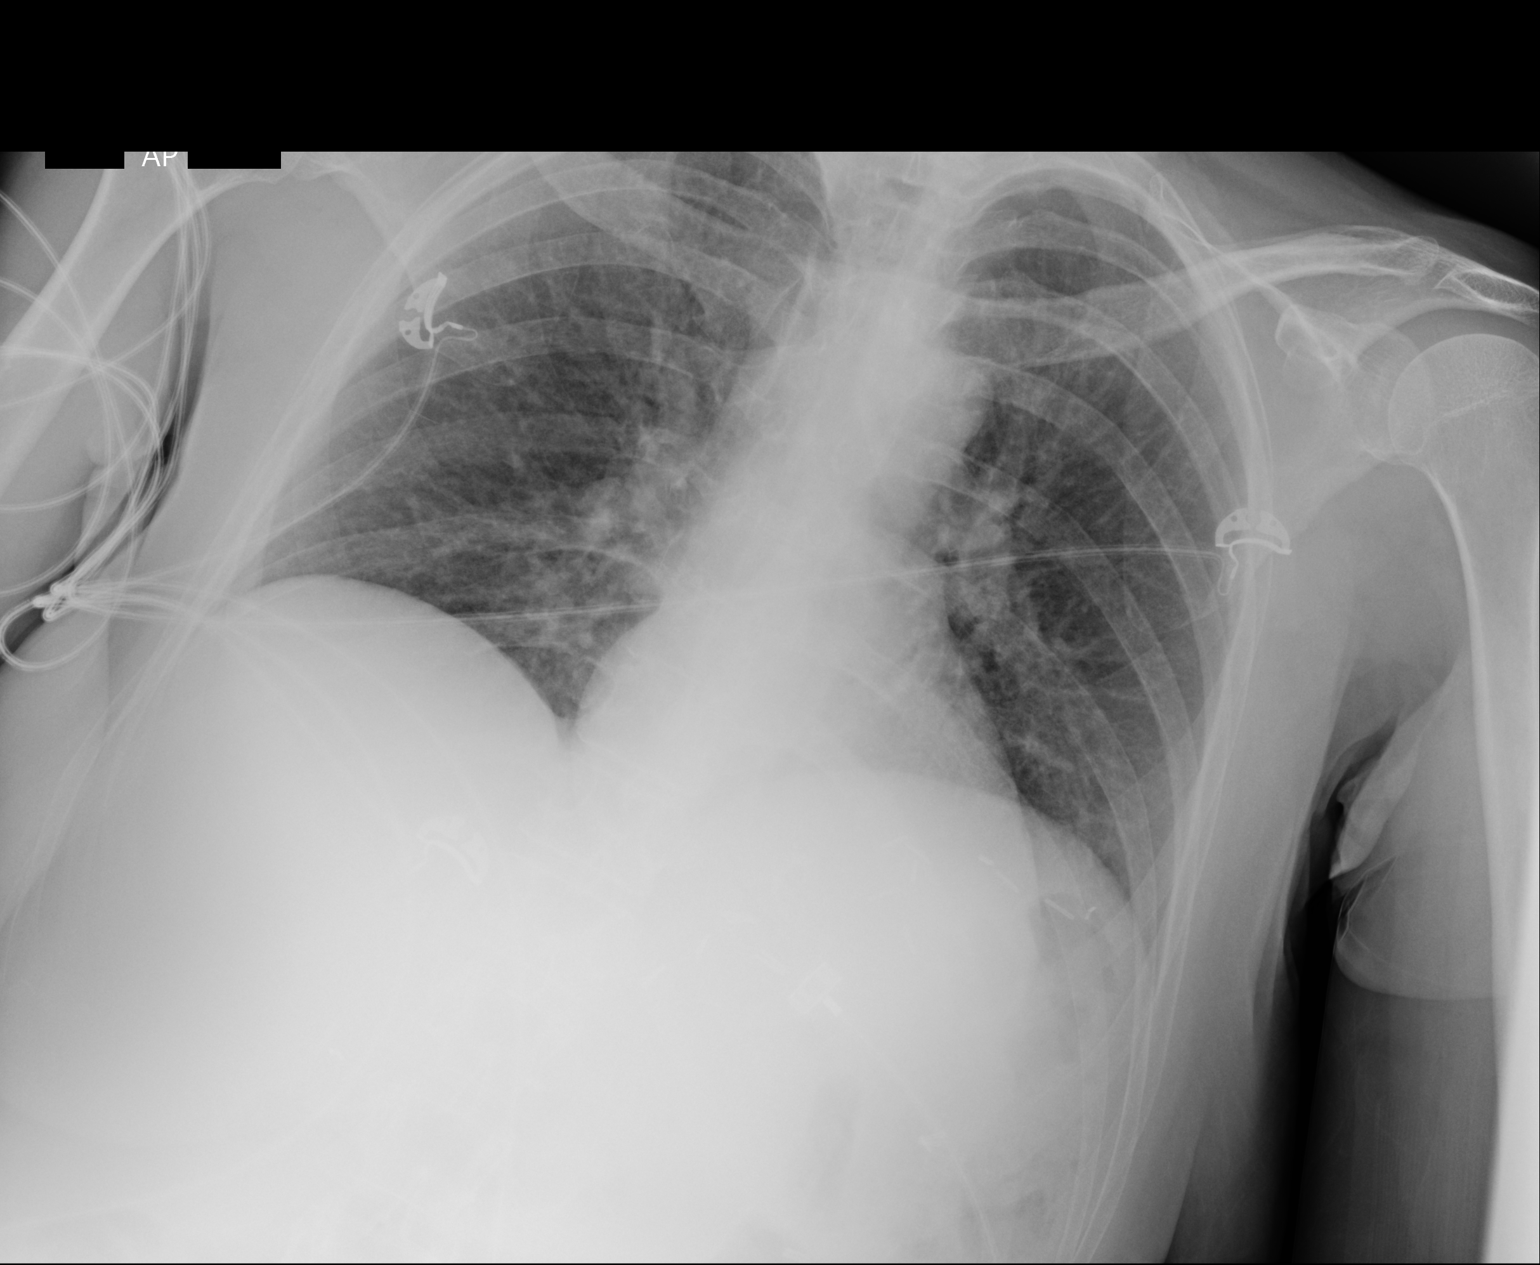

[1 of 1 positions shown; findings below may reference images not displayed]

FINDINGS: Cardiac and mediastinal silhouettes are stable in size and contour,
and remain within normal limits.

Lungs are hypoinflated. There is diffuse pulmonary vascular
congestion with indistinctness of the interstitial markings without
overt pulmonary edema. This finding favored to enlarged prior Re
related to shallow lung inflation. No focal infiltrate. No
pneumothorax.

Surgical clips overlie the left upper quadrant.

No acute osseus abnormality.
IMPRESSION: Shallow lung inflation with secondary diffuse pulmonary vascular
congestion and bronchovascular crowding. No definite cardiopulmonary
abnormality.
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
## 2015-12-29 ENCOUNTER — Ambulatory Visit (HOSPITAL_COMMUNITY): Payer: Self-pay | Admitting: Psychiatry

## 2016-01-08 DIAGNOSIS — N94819 Vulvodynia, unspecified: Secondary | ICD-10-CM | POA: Diagnosis not present

## 2016-01-09 ENCOUNTER — Ambulatory Visit (HOSPITAL_COMMUNITY): Payer: Medicare Other

## 2016-01-12 ENCOUNTER — Ambulatory Visit (HOSPITAL_COMMUNITY): Payer: Medicare Other | Attending: Physical Medicine & Rehabilitation | Admitting: Physical Therapy

## 2016-01-12 ENCOUNTER — Encounter (HOSPITAL_COMMUNITY): Payer: Self-pay | Admitting: Physical Therapy

## 2016-01-12 DIAGNOSIS — G8929 Other chronic pain: Secondary | ICD-10-CM | POA: Insufficient documentation

## 2016-01-12 DIAGNOSIS — M545 Low back pain: Secondary | ICD-10-CM | POA: Diagnosis not present

## 2016-01-12 DIAGNOSIS — R2689 Other abnormalities of gait and mobility: Secondary | ICD-10-CM | POA: Diagnosis not present

## 2016-01-12 DIAGNOSIS — M6281 Muscle weakness (generalized): Secondary | ICD-10-CM | POA: Diagnosis not present
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
## 2016-01-22 ENCOUNTER — Encounter: Payer: Medicare Other | Admitting: Physical Medicine & Rehabilitation

## 2016-01-22 ENCOUNTER — Other Ambulatory Visit: Payer: Self-pay | Admitting: Physical Medicine & Rehabilitation

## 2016-01-23 ENCOUNTER — Other Ambulatory Visit: Payer: Self-pay

## 2016-01-28 ENCOUNTER — Ambulatory Visit (HOSPITAL_COMMUNITY): Payer: Medicare Other | Attending: Physical Medicine & Rehabilitation

## 2016-01-28 ENCOUNTER — Telehealth (HOSPITAL_COMMUNITY): Payer: Self-pay

## 2016-01-28 ENCOUNTER — Encounter: Payer: Medicare Other | Admitting: Physical Medicine & Rehabilitation

## 2016-01-28 NOTE — Telephone Encounter (Signed)
No show, spoke to pt who was not aware of apt scheduled for today, stated she has plans to speak to MD prior beginning therapy (apt scheduled for this Friday 1/5/180.  Pt informed of next apt date and time with contact info given.   8076 Bridgeton CourtCasey Cockerham, LPTA; CBIS [phone]

## 2016-01-30 ENCOUNTER — Encounter: Payer: Self-pay | Admitting: Physical Medicine & Rehabilitation

## 2016-01-30 ENCOUNTER — Encounter: Payer: Medicare Other | Attending: Physical Medicine & Rehabilitation | Admitting: Physical Medicine & Rehabilitation

## 2016-01-30 VITALS — BP 112/71 | HR 79 | Resp 14

## 2016-01-30 DIAGNOSIS — M545 Low back pain: Secondary | ICD-10-CM | POA: Insufficient documentation

## 2016-01-30 DIAGNOSIS — F603 Borderline personality disorder: Secondary | ICD-10-CM | POA: Insufficient documentation

## 2016-01-30 DIAGNOSIS — M797 Fibromyalgia: Secondary | ICD-10-CM | POA: Diagnosis not present

## 2016-01-30 DIAGNOSIS — F513 Sleepwalking [somnambulism]: Secondary | ICD-10-CM | POA: Insufficient documentation

## 2016-01-30 DIAGNOSIS — G8929 Other chronic pain: Secondary | ICD-10-CM | POA: Diagnosis not present

## 2016-01-30 DIAGNOSIS — I890 Lymphedema, not elsewhere classified: Secondary | ICD-10-CM | POA: Diagnosis not present

## 2016-01-30 DIAGNOSIS — R51 Headache: Secondary | ICD-10-CM | POA: Diagnosis not present

## 2016-01-30 DIAGNOSIS — G629 Polyneuropathy, unspecified: Secondary | ICD-10-CM | POA: Insufficient documentation

## 2016-01-30 DIAGNOSIS — F4310 Post-traumatic stress disorder, unspecified: Secondary | ICD-10-CM | POA: Diagnosis not present

## 2016-01-30 DIAGNOSIS — N94819 Vulvodynia, unspecified: Secondary | ICD-10-CM

## 2016-01-30 DIAGNOSIS — M792 Neuralgia and neuritis, unspecified: Secondary | ICD-10-CM

## 2016-01-30 DIAGNOSIS — E669 Obesity, unspecified: Secondary | ICD-10-CM

## 2016-01-30 DIAGNOSIS — M5441 Lumbago with sciatica, right side: Secondary | ICD-10-CM

## 2016-01-30 DIAGNOSIS — F319 Bipolar disorder, unspecified: Secondary | ICD-10-CM

## 2016-01-30 DIAGNOSIS — G479 Sleep disorder, unspecified: Secondary | ICD-10-CM

## 2016-01-30 MED ORDER — BACLOFEN 10 MG PO TABS: 10.0000 mg | ORAL_TABLET | Freq: Three times a day (TID) | ORAL | 1 refills | Status: AC

## 2016-01-30 MED ORDER — NAPROXEN 500 MG PO TABS: 500.0000 mg | ORAL_TABLET | Freq: Two times a day (BID) | ORAL | 1 refills | Status: DC
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
## 2016-02-02 ENCOUNTER — Ambulatory Visit (HOSPITAL_COMMUNITY): Payer: Medicare Other | Admitting: Physical Therapy

## 2016-02-03 ENCOUNTER — Telehealth (HOSPITAL_COMMUNITY): Payer: Self-pay | Admitting: Physical Therapy

## 2016-02-03 NOTE — Telephone Encounter (Signed)
No Show #2 on 02/03/16. Called and left voicemail reminding pt of missed appointment. Reminded her of no show policy and because this was her 2nd no show, all appointments would be canceled except for her next appointment on 02/05/16 at 2:30. Provided office # and encouraged pt to call if she plans on missing this appointment.    6:10 PM,02/03/16 Marylyn IshiharaSara Kiser PT, DPT [facility] [phone]

## 2016-02-05 ENCOUNTER — Ambulatory Visit (HOSPITAL_COMMUNITY): Payer: Medicare Other | Admitting: Physical Therapy

## 2016-02-05 ENCOUNTER — Telehealth (HOSPITAL_COMMUNITY): Payer: Self-pay | Admitting: Physical Therapy

## 2016-02-05 NOTE — Telephone Encounter (Signed)
No show #3. Spike with pt who apologized and requested she be discharged from PT due to high levels of pain. She will try to return once she has found a doctor that can help her with the pain. Reminded her that she will need a new MD order to return.  4:04 PM,02/05/16 Vicki IshiharaSara Kiser PT, DPT [facility] [phone]

## 2016-02-09 ENCOUNTER — Ambulatory Visit (HOSPITAL_COMMUNITY): Payer: Medicare Other

## 2016-02-12 ENCOUNTER — Encounter (HOSPITAL_COMMUNITY): Payer: Self-pay | Admitting: Physical Therapy

## 2016-02-17 ENCOUNTER — Encounter (HOSPITAL_COMMUNITY): Payer: Self-pay | Admitting: Physical Therapy

## 2016-02-18 NOTE — Progress Notes (Deleted)
Psychiatric Initial Adult Assessment   Patient Identification: Vicki Thomas MRN:  [id number] Date of Evaluation:  02/18/2016 Referral Source: *** Chief Complaint:   Visit Diagnosis: No diagnosis found.  History of Present Illness:   Vicki Thomas is a 55 year old female with bipolar I disorder, PTSD, borderline personality disorder,  Fibromyalgia, lymphedema, chronic pain, chronic headaches, bariatric surgery who presented to establish care.     Associated Signs/Symptoms: Depression Symptoms:  {DEPRESSION SYMPTOMS:20000} (Hypo) Manic Symptoms:  {BHH MANIC SYMPTOMS:22872} Anxiety Symptoms:  {BHH ANXIETY SYMPTOMS:22873} Psychotic Symptoms:  {BHH PSYCHOTIC SYMPTOMS:22874} PTSD Symptoms: {BHH PTSD WUJWJXBJ:47829}  Past Psychiatric History:  Outpatient: used to see Dr. Tenny Craw, was in IOP Psychiatry admission: most recent in 12/20-27/15 in [facility] for SI of overdosing opiates Previous suicide attempt: multiple Past trials of medication:  History of violence:   Previous Psychotropic Medications: {YES/NO:21197}  Substance Abuse History in the last 12 months:  {yes no:314532}  Consequences of Substance Abuse: {BHH CONSEQUENCES OF SUBSTANCE ABUSE:22880}  Past Medical History:  Past Medical History:  Diagnosis Date  . Anxiety   . Arthritis   . Bipolar 1 disorder (HCC)   . Chronic back pain   . Chronic fatigue   . Chronic headaches   . Chronic pain   . Fibromyalgia   . Hiatal hernia   . History of borderline personality disorder   . Interstitial cystitis   . Lymphedema   . Panic disorder with agoraphobia   . PTSD (post-traumatic stress disorder)     Past Surgical History:  Procedure Laterality Date  . ABDOMINAL HYSTERECTOMY    . APPENDECTOMY    . BARIATRIC SURGERY    . CHOLECYSTECTOMY    . ESOPHAGUS SURGERY    . FOOT SURGERY    . HEMORRHOID SURGERY    . HERNIA REPAIR    . TONSILLECTOMY      Family Psychiatric History: ***  Family History:  Family History   Problem Relation Age of Onset  . Alcohol abuse Mother   . Bipolar disorder Father   . Paranoid behavior Father   . Alcohol abuse Father   . Bipolar disorder Sister   . Anxiety disorder Sister   . Alcohol abuse Brother   . Drug abuse Brother   . Bipolar disorder Paternal Aunt   . Schizophrenia Maternal Grandfather   . Anxiety disorder Maternal Aunt     Social History:   Social History   Social History  . Marital status: Single    Spouse name: N/A  . Number of children: N/A  . Years of education: N/A   Social History Main Topics  . Smoking status: Former Smoker    Packs/day: 1.00    Years: 6.00  . Smokeless tobacco: Never Used  . Alcohol use No  . Drug use: No  . Sexual activity: Not Currently   Other Topics Concern  . Not on file   Social History Narrative  . No narrative on file    Additional Social History: ***  Allergies:   Allergies  Allergen Reactions  . Bee Venom Swelling    Requires Epipen  . Erythromycin Shortness Of Breath    Throat closes, FLUSHING   . Haldol [Haloperidol Decanoate] Other (See Comments)    Seizures  . Penicillins Shortness Of Breath    Throat closes, FLUSHING  . Abilify [Aripiprazole]   . Aripiprazole   . Ciprocin-Fluocin-Procin [Fluocinolone Acetonide]   . Fexofenadine Hcl   . Gabapentin     Dizziness and excessive falling  .  Iodine   . Latuda [Lurasidone Hcl]     Feels like her head is going to explode   . Lithium   . Marcaine [Bupivacaine Hcl]   . Paroxetine Hcl   . Pregabalin   . Prozac [Fluoxetine Hcl]   . Remeron [Mirtazapine]   . Seroquel [Quetiapine Fumarate]   . Tape   . Zofran   . Zonisamide     Metabolic Disorder Labs: No results found for: HGBA1C, MPG No results found for: PROLACTIN No results found for: CHOL, TRIG, HDL, CHOLHDL, VLDL, LDLCALC   Current Medications: Current Outpatient Prescriptions  Medication Sig Dispense Refill  . baclofen (LIORESAL) 10 MG tablet Take 1 tablet (10 mg total) by  mouth 3 (three) times daily. 90 tablet 1  . DULoxetine (CYMBALTA) 60 MG capsule Take 1 capsule (60 mg total) by mouth daily. 30 capsule 0  . esomeprazole (NEXIUM) 40 MG capsule Take 1 capsule (40 mg total) by mouth daily.    Marland Kitchen loratadine (CLARITIN) 10 MG tablet Take 10 mg by mouth daily.    . multivitamin-lutein (OCUVITE-LUTEIN) CAPS capsule Take 1 capsule by mouth daily.  0  . naproxen (NAPROSYN) 500 MG tablet Take 1 tablet (500 mg total) by mouth 2 (two) times daily with a meal. 60 tablet 1  . potassium chloride (K-DUR) 10 MEQ tablet Take 1 tablet (10 mEq total) by mouth daily. 10 tablet 0  . promethazine (PHENERGAN) 25 MG tablet Take 25 mg by mouth every 8 (eight) hours as needed for nausea or vomiting.    . rizatriptan (MAXALT) 5 MG tablet Take 5 mg by mouth as needed for migraine. May repeat in 2 hours if needed    . Sennosides 25 MG TABS Take by mouth.    . simethicone (MYLICON) 125 MG chewable tablet Chew 125 mg by mouth every 6 (six) hours as needed for flatulence.     No current facility-administered medications for this visit.     Neurologic: Headache: Yes Seizure: No Paresthesias:No  Musculoskeletal: Strength & Muscle Tone: within normal limits Gait & Station: normal Patient leans: N/A  Psychiatric Specialty Exam: ROS  There were no vitals taken for this visit.There is no height or weight on file to calculate BMI.  General Appearance: Fairly Groomed  Eye Contact:  Good  Speech:  Clear and Coherent  Volume:  Normal  Mood:  {BHH MOOD:22306}  Affect:  {Affect (PAA):22687}  Thought Process:  Coherent and Goal Directed  Orientation:  Full (Time, Place, and Person)  Thought Content:  Logical  Suicidal Thoughts:  {ST/HT (PAA):22692}  Homicidal Thoughts:  {ST/HT (PAA):22692}  Memory:  Immediate;   Good Recent;   Good Remote;   Good  Judgement:  {Judgement (PAA):22694}  Insight:  {Insight (PAA):22695}  Psychomotor Activity:  Normal  Concentration:  Concentration: Good  and Attention Span: Good  Recall:  Good  Fund of Knowledge:Good  Language: Good  Akathisia:  No  Handed:  Right  AIMS (if indicated):  N/A  Assets:  Communication Skills Desire for Improvement  ADL's:  Intact  Cognition: WNL  Sleep:  ***   Assessment  Plan  The patient demonstrates the following risk factors for suicide: Chronic risk factors for suicide include: {Chronic Risk Factors for ZOXWRUE:45409811}. Acute risk factors for suicide include: {Acute Risk Factors for BJYNWGN:56213086}. Protective factors for this patient include: {Protective Factors for Suicide VHQI:69629528}. Considering these factors, the overall suicide risk at this point appears to be {Desc; low/moderate/high:110033}. Patient {ACTION; IS/IS UXL:24401027} appropriate for outpatient  follow up.   Treatment Plan Summary: [facility] [place] ZOXW:[id number]}[phone]}   Neysa Hottereina Dema Timmons, MD 1/24/20183:42 PM

## 2016-02-19 ENCOUNTER — Encounter (HOSPITAL_COMMUNITY): Payer: Self-pay | Admitting: Physical Therapy

## 2016-02-25 ENCOUNTER — Ambulatory Visit (HOSPITAL_COMMUNITY): Payer: Self-pay | Admitting: Psychiatry

## 2016-03-12 ENCOUNTER — Encounter: Payer: Medicare Other | Admitting: Physical Medicine & Rehabilitation

## 2017-03-05 IMAGING — DX DG CHEST 2V
2 series · 2 of 2 positions shown · non-contrast
Comparison: 01/21/2014

CLINICAL DATA: Cough and shortness of breath for 1 week.

EXAM:
CHEST  2 VIEW

[chest pa]
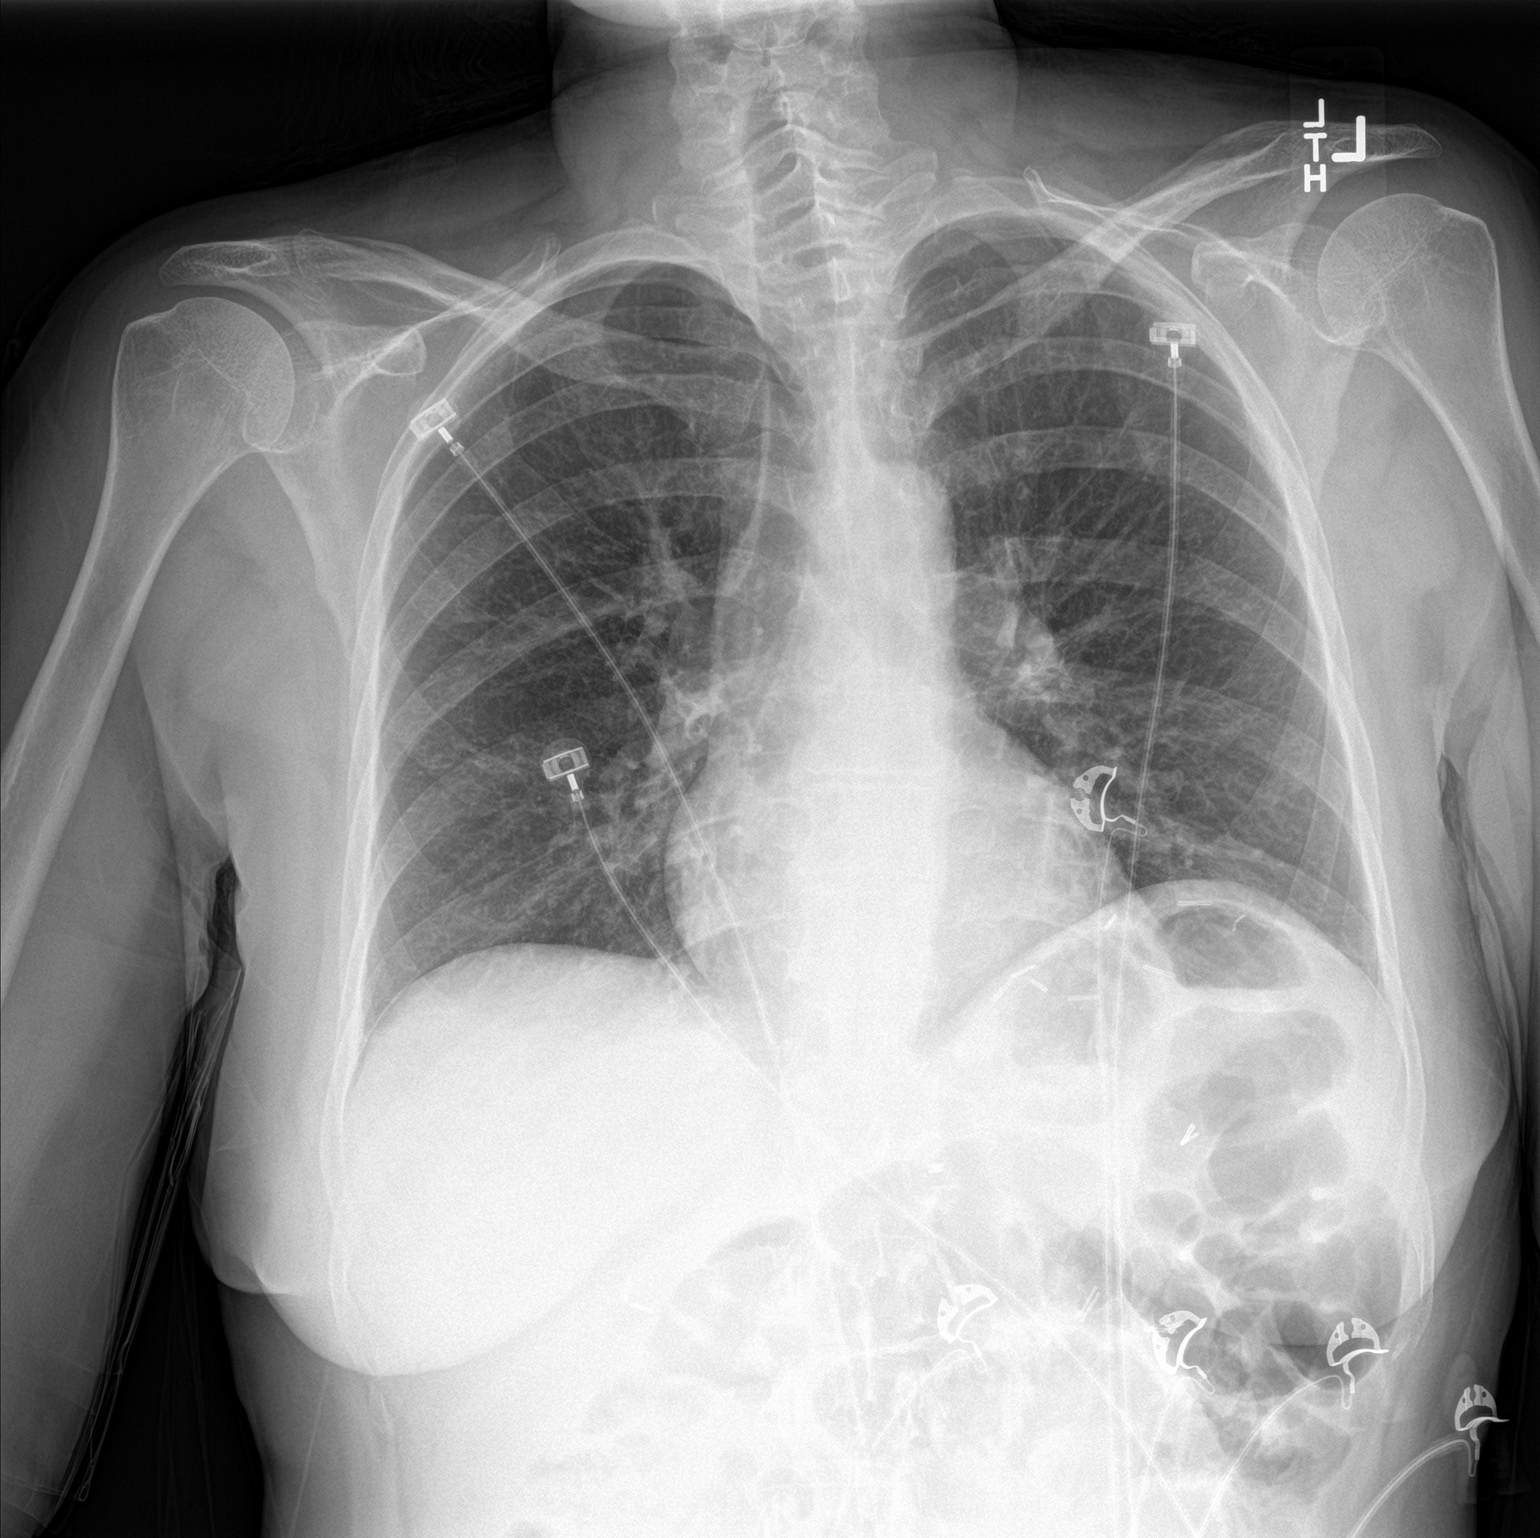

[chest lat]
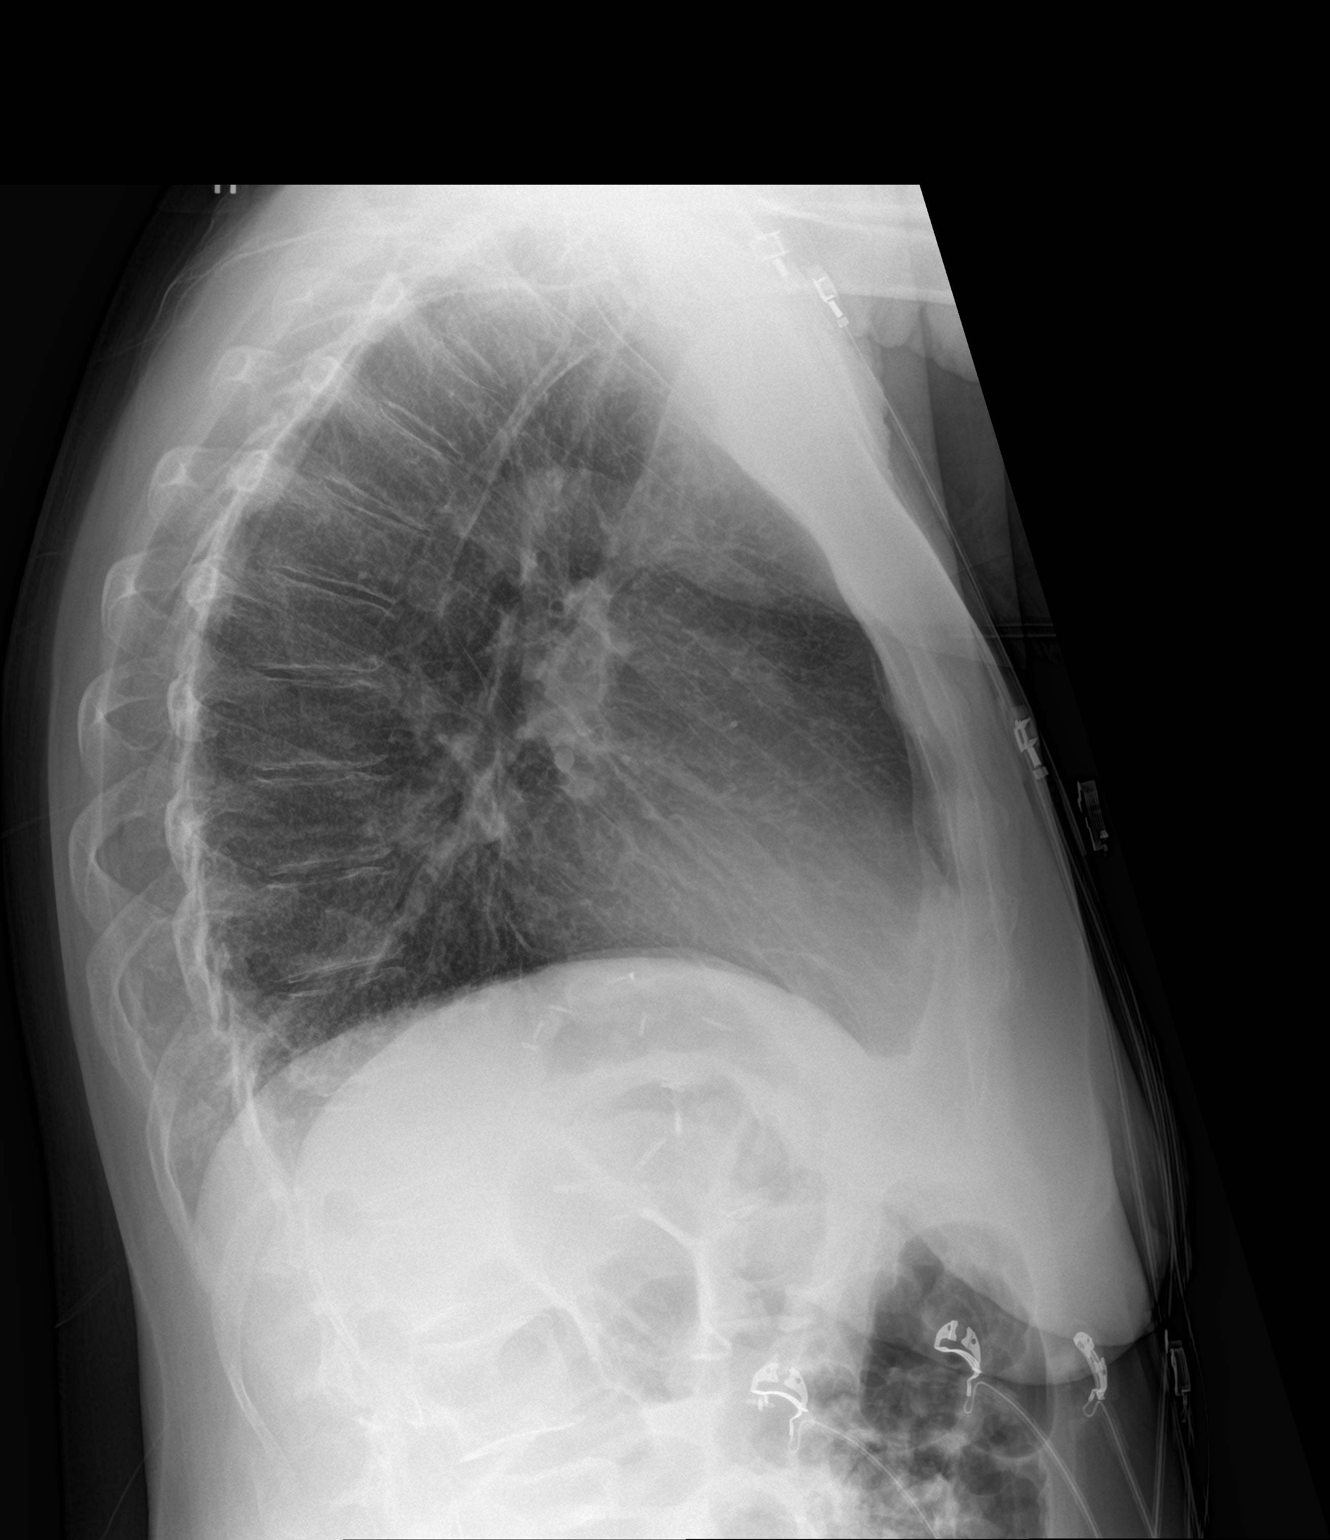

[2 of 2 positions shown; findings below may reference images not displayed]

FINDINGS: The cardiomediastinal contours are normal. The lungs are clear.
Pulmonary vasculature is normal. No consolidation, pleural effusion,
or pneumothorax. No acute osseous abnormalities are seen. Surgical
clips in the left upper quadrant of the abdomen.
IMPRESSION: No active cardiopulmonary disease.

## 2019-06-24 ENCOUNTER — Telehealth: Payer: Self-pay | Admitting: Physician Assistant

## 2019-06-24 NOTE — Telephone Encounter (Signed)
I connected by phone with Vicki Thomas and/or patient's caregiver on 06/24/2019 at 8:24 AM to discuss the potential vaccination through our Homebound vaccination initiative.   Prevaccination Checklist for COVID-19 Vaccines  1.  Are you feeling sick today? no  2.  Have you ever received a dose of a COVID-19 vaccine?  no      If yes, which one? None   3.  Have you ever had an allergic reaction: (This would include a severe reaction [ e.g., anaphylaxis] that required treatment with epinephrine or EpiPen or that caused you to go to the hospital.  It would also include an allergic reaction that occurred within 4 hours that caused hives, swelling, or respiratory distress, including wheezing.) A.  A previous dose of COVID-19 vaccine. no  B.  A vaccine or injectable therapy that contains multiple components, one of which is a COVID-19 vaccine component, but it is not known which component elicited the immediate reaction. no  C.  Are you allergic to polyethylene glycol? no   4.  Have you ever had an allergic reaction to another vaccine (other than COVID-19 vaccine) or an injectable medication? (This would include a severe reaction [ e.g., anaphylaxis] that required treatment with epinephrine or EpiPen or that caused you to go to the hospital.  It would also include an allergic reaction that occurred within 4 hours that caused hives, swelling, or respiratory distress, including wheezing.)  no   5.  Have you ever had a severe allergic reaction (e.g., anaphylaxis) to something other than a component of the COVID-19 vaccine, or any vaccine or injectable medication?  This would include food, pet, venom, environmental, or oral medication allergies.  no   6.  Have you received any vaccine in the last 14 days? no   7.  Have you ever had a positive test for COVID-19 or has a doctor ever told you that you had COVID-19?  no   8.  Have you received passive antibody therapy (monoclonal antibodies or convalescent  serum) as a treatment for COVID-19? no   9.  Do you have a weakened immune system caused by something such as HIV infection or cancer or do you take immunosuppressive drugs or therapies?  no   10.  Do you have a bleeding disorder or are you taking a blood thinner? no   11.  Are you pregnant or breast-feeding? no   12.  Do you have dermal fillers? no   __________________   This patient is a 59 y.o. female that meets the FDA criteria to receive homebound vaccination. Patient or parent/caregiver understands they have the option to accept or refuse homebound vaccination.  Patient passed the pre-screening checklist and would like to proceed with homebound vaccination.  Based on questionnaire above, I recommend the patient be observed for 30 minutes.  There are no other household members/caregivers who are also interested in receiving the vaccine.   I will send the patient's information to our scheduling team who will reach out to schedule the patient and potential caregiver/family members for homebound vaccination.    Cline Crock 06/24/2019 8:24 AM

## 2019-06-26 ENCOUNTER — Telehealth: Payer: Self-pay

## 2019-06-26 NOTE — Telephone Encounter (Signed)
From my understanding your only seeing new patients in person right? KW

## 2019-06-26 NOTE — Telephone Encounter (Signed)
Correct, chart was reviewed and will have to be in office visits.

## 2019-06-26 NOTE — Telephone Encounter (Signed)
Copied from CRM [id number]. Topic: Appointment Scheduling - Scheduling Inquiry for Clinic >> May 30, 2019 12:01 PM Elliot Gault wrote:  Vicki Thomas. Demilio daughter would like to establish patient as a new patient. Daughter requesting new patient be virtual due to patient having a fear to leave home. Please advise daughter If New Patient can be virtual

## 2019-06-26 NOTE — Telephone Encounter (Signed)
Attempted to contact patient voice mail box is full if patient returns call okay for [facility] triage to advise patient of provider message below. KW

## 2019-06-27 NOTE — Telephone Encounter (Signed)
Attempted to call pt.  Left vm to return call to office to discuss scheduling appt. To establish care.

## 2019-07-03 ENCOUNTER — Ambulatory Visit: Payer: Medicare Other | Attending: Critical Care Medicine

## 2019-07-03 DIAGNOSIS — Z23 Encounter for immunization: Secondary | ICD-10-CM

## 2019-07-03 NOTE — Progress Notes (Signed)
   Covid-19 Vaccination Clinic  Name:  Kameisha Malicki    MRN: [id number] DOB: May 26, 1960  07/03/2019  Ms. Puleo was observed post Covid-19 immunization for 15 minutes without incident. She was provided with Vaccine Information Sheet and instruction to access the V-Safe system.   Ms. Lacap was instructed to call 911 with any severe reactions post vaccine: Marland Kitchen Difficulty breathing  . Swelling of face and throat  . A fast heartbeat  . A bad rash all over body  . Dizziness and weakness   Immunizations Administered    Name Date Dose VIS Date Route   Moderna COVID-19 Vaccine 07/03/2019 12:35 PM 0.5 mL 12/2018 Intramuscular   Manufacturer: Moderna   Lot: [id number]   NDC: 42706-237-62

## 2019-07-31 ENCOUNTER — Ambulatory Visit: Payer: Medicare Other | Attending: Critical Care Medicine

## 2019-07-31 DIAGNOSIS — Z23 Encounter for immunization: Secondary | ICD-10-CM

## 2019-07-31 NOTE — Progress Notes (Signed)
   Covid-19 Vaccination Clinic  Name:  Vicki Thomas    MRN: [id number] DOB: [birth year]  07/31/2019  Vicki Thomas was observed post Covid-19 immunization for 15 minutes without incident. She was provided with Vaccine Information Sheet and instruction to access the V-Safe system.   Vicki Thomas was instructed to call 911 with any severe reactions post vaccine: Marland Kitchen Difficulty breathing  . Swelling of face and throat  . A fast heartbeat  . A bad rash all over body  . Dizziness and weakness   Immunizations Administered    Name Date Dose VIS Date Route   Moderna COVID-19 Vaccine 07/31/2019 12:30 PM 0.5 mL 12/2018 Intramuscular   Manufacturer: Gala Murdoch   Lot: [id number]   NDC: 32671-245-80

## 2019-12-05 ENCOUNTER — Ambulatory Visit: Payer: Self-pay

## 2020-01-29 ENCOUNTER — Ambulatory Visit: Payer: Self-pay

## 2020-02-06 ENCOUNTER — Ambulatory Visit: Payer: Self-pay

## 2020-02-19 ENCOUNTER — Ambulatory Visit: Payer: Medicare Other | Attending: Critical Care Medicine

## 2020-02-19 DIAGNOSIS — Z23 Encounter for immunization: Secondary | ICD-10-CM

## 2020-02-19 NOTE — Progress Notes (Signed)
   Covid-19 Vaccination Clinic  Name:  Vicki Thomas    MRN: [id number] DOB: 11/30/1960  02/19/2020  [name] was observed post Covid-19 immunization for 15 minutes without incident. She was provided with Vaccine Information Sheet and instruction to access the V-Safe system.   [name] was instructed to call 911 with any severe reactions post vaccine: Marland Kitchen Difficulty breathing  . Swelling of face and throat  . A fast heartbeat  . A bad rash all over body  . Dizziness and weakness   Immunizations Administered    Name Date Dose VIS Date Route   Moderna Covid-19 Booster Vaccine 02/19/2020 12:10 AM 0.25 mL 11/14/2019 Intramuscular   Manufacturer: Moderna   Lot: [id number]   NDC: 50932-671-24

## 2023-05-03 ENCOUNTER — Emergency Department (HOSPITAL_COMMUNITY)
Admission: EM | Admit: 2023-05-03 | Discharge: 2023-05-03 | Disposition: A | Discharge status: Home / Self Care | Attending: Emergency Medicine | Admitting: Emergency Medicine

## 2023-05-03 ENCOUNTER — Other Ambulatory Visit: Payer: Self-pay

## 2023-05-03 ENCOUNTER — Encounter (HOSPITAL_COMMUNITY): Payer: Self-pay | Admitting: Emergency Medicine

## 2023-05-03 DIAGNOSIS — R509 Fever, unspecified: Secondary | ICD-10-CM | POA: Insufficient documentation

## 2023-05-03 DIAGNOSIS — R300 Dysuria: Secondary | ICD-10-CM | POA: Diagnosis not present

## 2023-05-03 DIAGNOSIS — R208 Other disturbances of skin sensation: Secondary | ICD-10-CM | POA: Insufficient documentation

## 2023-05-03 DIAGNOSIS — N3000 Acute cystitis without hematuria: Secondary | ICD-10-CM

## 2023-05-03 LAB — BASIC METABOLIC PANEL WITH GFR
Anion gap: 11 (ref 5–15)
BUN: 11 mg/dL (ref 8–23)
CO2: 27 mmol/L (ref 22–32)
Calcium: 9 mg/dL (ref 8.9–10.3)
Chloride: 102 mmol/L (ref 98–111)
Creatinine, Ser: 0.97 mg/dL (ref 0.44–1.00)
GFR, Estimated: >60 mL/min (ref >60)
Glucose, Bld: 116 mg/dL — ABNORMAL HIGH (ref 70–99)
Potassium: 3.2 mmol/L — ABNORMAL LOW (ref 3.5–5.1)
Sodium: 140 mmol/L (ref 135–145)

## 2023-05-03 LAB — CBC WITH DIFFERENTIAL/PLATELET
Abs Immature Granulocytes: 0.05 10*3/uL (ref 0.00–0.07)
Basophils Absolute: 0 10*3/uL (ref 0.0–0.1)
Basophils Relative: 0 %
Eosinophils Absolute: 0 10*3/uL (ref 0.0–0.5)
Eosinophils Relative: 0 %
HCT: 45.3 % (ref 36.0–46.0)
Hemoglobin: 15.3 g/dL — ABNORMAL HIGH (ref 12.0–15.0)
Immature Granulocytes: 1 %
Lymphocytes Relative: 16 %
Lymphs Abs: 1.5 10*3/uL (ref 0.7–4.0)
MCH: 33.8 pg (ref 26.0–34.0)
MCHC: 33.8 g/dL (ref 30.0–36.0)
MCV: 100 fL (ref 80.0–100.0)
Monocytes Absolute: 0.7 10*3/uL (ref 0.1–1.0)
Monocytes Relative: 7 %
Neutro Abs: 7.1 10*3/uL (ref 1.7–7.7)
Neutrophils Relative %: 76 %
Platelets: 234 10*3/uL (ref 150–400)
RBC: 4.53 MIL/uL (ref 3.87–5.11)
RDW: 12.1 % (ref 11.5–15.5)
WBC: 9.4 10*3/uL (ref 4.0–10.5)
nRBC: 0 % (ref 0.0–0.2)

## 2023-05-03 LAB — URINALYSIS, ROUTINE W REFLEX MICROSCOPIC
Bacteria, UA: NONE SEEN
Bilirubin Urine: NEGATIVE
Glucose, UA: NEGATIVE mg/dL
Ketones, ur: NEGATIVE mg/dL
Leukocytes,Ua: NEGATIVE
Nitrite: POSITIVE — AB
Protein, ur: 30 mg/dL — AB
Specific Gravity, Urine: 1.018 (ref 1.005–1.030)
pH: 5 (ref 5.0–8.0)

## 2023-05-03 MED ORDER — CIPROFLOXACIN HCL 500 MG PO TABS: 500.0000 mg | ORAL_TABLET | Freq: Two times a day (BID) | ORAL | 0 refills | Status: DC

## 2023-05-03 MED ORDER — ONDANSETRON 8 MG PO TBDP: ORAL_TABLET | ORAL | 0 refills | Status: DC

## 2023-05-03 MED ORDER — CIPROFLOXACIN HCL 250 MG PO TABS
500.0000 mg | ORAL_TABLET | Freq: Once | ORAL | Status: AC
  Administered 2023-05-03: 500 mg via ORAL
  Filled 2023-05-03: qty 2

## 2023-05-03 NOTE — ED Triage Notes (Signed)
 Pt c/o bilateral leg and feet pain . Pt states she has not been out of her house in 8 years. Pt states she feels anxious.

## 2023-05-03 NOTE — Discharge Instructions (Signed)
 Begin taking Cipro as prescribed.  Begin taking Zofran as prescribed as needed for nausea.  Follow-up with a primary doctor in the near future.

## 2023-05-03 NOTE — ED Provider Notes (Signed)
 [place] EMERGENCY DEPARTMENT AT [facility] Provider Note   CSN: [id number] Arrival date & time: 05/03/23  0129     History  Chief Complaint  Patient presents with   Leg Pain    Vicki Thomas is a 63 y.o. female.  Patient is a 63 year old female with past medical history of agoraphobia, PTSD.  Patient states that she has "not left her house in 8 years".  Patient presenting tonight with complaints of a burning sensation in her hands and feet.  She describes a constant pain that is worsening and is concerned she may have a neuropathy.  She denies to me she is having any fevers or chills.  No injury or trauma.       Home Medications Prior to Admission medications   Medication Sig Start Date End Date Taking? Authorizing Provider  DULoxetine (CYMBALTA) 60 MG capsule Take 1 capsule (60 mg total) by mouth daily. 01/20/14   Thermon Leyland, NP  esomeprazole (NEXIUM) 40 MG capsule Take 1 capsule (40 mg total) by mouth daily. 01/20/14   Thermon Leyland, NP  loratadine (CLARITIN) 10 MG tablet Take 10 mg by mouth daily.    [provider]  multivitamin-lutein (OCUVITE-LUTEIN) CAPS capsule Take 1 capsule by mouth daily. 01/20/14   Thermon Leyland, NP  naproxen (NAPROSYN) 500 MG tablet Take 1 tablet (500 mg total) by mouth 2 (two) times daily with a meal. 01/30/16   Patel, Maryln Gottron, MD  potassium chloride (K-DUR) 10 MEQ tablet Take 1 tablet (10 mEq total) by mouth daily. 04/02/15   Horton, Mayer Masker, MD  promethazine (PHENERGAN) 25 MG tablet Take 25 mg by mouth every 8 (eight) hours as needed for nausea or vomiting.    [provider]  rizatriptan (MAXALT) 5 MG tablet Take 5 mg by mouth as needed for migraine. May repeat in 2 hours if needed    [provider]  Sennosides 25 MG TABS Take by mouth.    [provider]  simethicone (MYLICON) 125 MG chewable tablet Chew 125 mg by mouth every 6 (six) hours as needed for flatulence.    [provider]      Allergies    Bee venom, Erythromycin, Haldol [haloperidol decanoate], Penicillins, Abilify [aripiprazole], Aripiprazole, Ciprocin-fluocin-procin [fluocinolone acetonide], Fexofenadine hcl, Gabapentin, Iodine, Latuda [lurasidone hcl], Lithium, Marcaine [bupivacaine hcl], Paroxetine hcl, Pregabalin, Prozac [fluoxetine hcl], Remeron [mirtazapine], Seroquel [quetiapine fumarate], Tape, Zofran, and Zonisamide    Review of Systems   Review of Systems  All other systems reviewed and are negative.   Physical Exam Updated Vital Signs BP 106/71 (BP Location: Right Arm)   Pulse 90   Temp 98.6 F (37 C) (Oral)   Resp 20   Ht 5\' 8"  (1.727 m)   Wt 80.7 kg   SpO2 95%   BMI 27.05 kg/m  Physical Exam Vitals and nursing note reviewed.  Constitutional:      General: She is not in acute distress.    Appearance: She is well-developed. She is not diaphoretic.  HENT:     Head: Normocephalic and atraumatic.  Cardiovascular:     Rate and Rhythm: Normal rate and regular rhythm.     Heart sounds: No murmur heard.    No friction rub. No gallop.  Pulmonary:     Effort: Pulmonary effort is normal. No respiratory distress.     Breath sounds: Normal breath sounds. No wheezing.  Abdominal:     General: Bowel sounds are normal. There  is no distension.     Palpations: Abdomen is soft.     Tenderness: There is no abdominal tenderness.  Musculoskeletal:        General: Normal range of motion.     Cervical back: Normal range of motion and neck supple.     Comments: Hands and feet are both grossly normal in appearance.  She is able to move all digits and pulses are all palpable throughout.  Skin:    General: Skin is warm and dry.  Neurological:     General: No focal deficit present.     Mental Status: She is alert and oriented to person, place, and time.     ED Results / Procedures / Treatments   Labs (all labs ordered are listed, but only abnormal results are displayed) Labs Reviewed   BASIC METABOLIC PANEL WITH GFR  CBC WITH DIFFERENTIAL/PLATELET  URINALYSIS, ROUTINE W REFLEX MICROSCOPIC    EKG None  Radiology No results found.  Procedures Procedures    Medications Ordered in ED Medications - No data to display  ED Course/ Medical Decision Making/ A&P  Patient is a 63 year old female with history of agoraphobia presenting with complaints of burning in her hands and feet as well as dysuria and low-grade fever.  Patient arrives here with stable vital signs and is afebrile.  Physical examination is unremarkable.  She is neurologically intact and abdomen is benign.  Laboratory studies obtained including CBC and basic metabolic panel.  There is no leukocytosis and electrolytes are basically unremarkable.  Urinalysis shows positive nitrites.  Patient to be treated for UTI with Keflex.  She is also requesting medication for nausea so Zofran will be prescribed as well.  Overall, I feel as though patient needs to obtain a primary care physician to discuss her issues.  Final Clinical Impression(s) / ED Diagnoses Final diagnoses:  None    Rx / DC Orders ED Discharge Orders     None         Geoffery Lyons, MD 05/03/23 [phone]

## 2023-05-04 ENCOUNTER — Encounter (HOSPITAL_COMMUNITY): Payer: Self-pay

## 2023-05-04 ENCOUNTER — Emergency Department (HOSPITAL_COMMUNITY)
Admission: EM | Admit: 2023-05-04 | Discharge: 2023-05-05 | Disposition: A | Discharge status: Other Acute Inpatient Hospital | Attending: Emergency Medicine | Admitting: Emergency Medicine

## 2023-05-04 ENCOUNTER — Other Ambulatory Visit: Payer: Self-pay

## 2023-05-04 DIAGNOSIS — Z79899 Other long term (current) drug therapy: Secondary | ICD-10-CM | POA: Insufficient documentation

## 2023-05-04 DIAGNOSIS — E876 Hypokalemia: Secondary | ICD-10-CM | POA: Diagnosis not present

## 2023-05-04 DIAGNOSIS — F4310 Post-traumatic stress disorder, unspecified: Secondary | ICD-10-CM | POA: Diagnosis not present

## 2023-05-04 DIAGNOSIS — M797 Fibromyalgia: Secondary | ICD-10-CM | POA: Diagnosis not present

## 2023-05-04 DIAGNOSIS — F603 Borderline personality disorder: Secondary | ICD-10-CM | POA: Diagnosis not present

## 2023-05-04 DIAGNOSIS — N3001 Acute cystitis with hematuria: Secondary | ICD-10-CM | POA: Insufficient documentation

## 2023-05-04 DIAGNOSIS — R45851 Suicidal ideations: Secondary | ICD-10-CM | POA: Diagnosis not present

## 2023-05-04 DIAGNOSIS — F209 Schizophrenia, unspecified: Secondary | ICD-10-CM | POA: Diagnosis present

## 2023-05-04 LAB — URINALYSIS, ROUTINE W REFLEX MICROSCOPIC
Bilirubin Urine: NEGATIVE
Bilirubin Urine: NEGATIVE
Cellular Cast, UA: 29
Cellular Cast, UA: 12
Glucose, UA: NEGATIVE mg/dL
Glucose, UA: NEGATIVE mg/dL
Hgb urine dipstick: NEGATIVE
Hgb urine dipstick: NEGATIVE
Ketones, ur: NEGATIVE mg/dL
Ketones, ur: 5 mg/dL — AB
Leukocytes,Ua: NEGATIVE
Leukocytes,Ua: NEGATIVE
Nitrite: NEGATIVE
Nitrite: POSITIVE — AB
Protein, ur: 100 mg/dL — AB
Protein, ur: 30 mg/dL — AB
Specific Gravity, Urine: 1.021 (ref 1.005–1.030)
Specific Gravity, Urine: 1.016 (ref 1.005–1.030)
pH: 5 (ref 5.0–8.0)
pH: 5 (ref 5.0–8.0)

## 2023-05-04 LAB — CBC WITH DIFFERENTIAL/PLATELET
Abs Immature Granulocytes: 0.02 10*3/uL (ref 0.00–0.07)
Basophils Absolute: 0.1 10*3/uL (ref 0.0–0.1)
Basophils Relative: 1 %
Eosinophils Absolute: 0.1 10*3/uL (ref 0.0–0.5)
Eosinophils Relative: 2 %
HCT: 43.1 % (ref 36.0–46.0)
Hemoglobin: 14.9 g/dL (ref 12.0–15.0)
Immature Granulocytes: 0 %
Lymphocytes Relative: 23 %
Lymphs Abs: 1.8 10*3/uL (ref 0.7–4.0)
MCH: 33.9 pg (ref 26.0–34.0)
MCHC: 34.6 g/dL (ref 30.0–36.0)
MCV: 98 fL (ref 80.0–100.0)
Monocytes Absolute: 0.6 10*3/uL (ref 0.1–1.0)
Monocytes Relative: 8 %
Neutro Abs: 5.1 10*3/uL (ref 1.7–7.7)
Neutrophils Relative %: 66 %
Platelets: 252 10*3/uL (ref 150–400)
RBC: 4.4 MIL/uL (ref 3.87–5.11)
RDW: 12.4 % (ref 11.5–15.5)
WBC: 7.7 10*3/uL (ref 4.0–10.5)
nRBC: 0 % (ref 0.0–0.2)

## 2023-05-04 LAB — MAGNESIUM: Magnesium: 2.1 mg/dL (ref 1.7–2.4)

## 2023-05-04 LAB — RAPID URINE DRUG SCREEN, HOSP PERFORMED
Barbiturates: NOT DETECTED
Benzodiazepines: NOT DETECTED
Cocaine: NOT DETECTED

## 2023-05-04 LAB — COMPREHENSIVE METABOLIC PANEL WITH GFR
ALT: 17 U/L (ref 0–44)
AST: 25 U/L (ref 15–41)
Albumin: 3.8 g/dL (ref 3.5–5.0)
Alkaline Phosphatase: 82 U/L (ref 38–126)
Anion gap: 17 — ABNORMAL HIGH (ref 5–15)
BUN: 15 mg/dL (ref 8–23)
CO2: 18 mmol/L — ABNORMAL LOW (ref 22–32)
Calcium: 8.9 mg/dL (ref 8.9–10.3)
Chloride: 102 mmol/L (ref 98–111)
Creatinine, Ser: 1.38 mg/dL — ABNORMAL HIGH (ref 0.44–1.00)
GFR, Estimated: 43 mL/min — ABNORMAL LOW (ref >60)
Glucose, Bld: 139 mg/dL — ABNORMAL HIGH (ref 70–99)
Potassium: 2.6 mmol/L — CL (ref 3.5–5.1)
Sodium: 137 mmol/L (ref 135–145)
Total Bilirubin: 0.9 mg/dL (ref 0.0–1.2)
Total Protein: 6.6 g/dL (ref 6.5–8.1)

## 2023-05-04 LAB — ETHANOL: Alcohol, Ethyl (B): <10 mg/dL (ref <10)

## 2023-05-04 MED ORDER — CIPROFLOXACIN HCL 250 MG PO TABS
500.0000 mg | ORAL_TABLET | Freq: Two times a day (BID) | ORAL | Status: DC
  Administered 2023-05-04 – 2023-05-05 (×2): 500 mg via ORAL
  Filled 2023-05-04 (×2): qty 2

## 2023-05-04 MED ORDER — LORAZEPAM 1 MG PO TABS
1.0000 mg | ORAL_TABLET | Freq: Three times a day (TID) | ORAL | Status: DC | PRN
  Administered 2023-05-04 – 2023-05-05 (×2): 1 mg via ORAL
  Filled 2023-05-04 (×2): qty 1

## 2023-05-04 MED ORDER — POTASSIUM CHLORIDE CRYS ER 20 MEQ PO TBCR
40.0000 meq | EXTENDED_RELEASE_TABLET | Freq: Once | ORAL | Status: AC
  Administered 2023-05-04: 40 meq via ORAL
  Filled 2023-05-04: qty 2

## 2023-05-04 MED ORDER — ACETAMINOPHEN 325 MG PO TABS
650.0000 mg | ORAL_TABLET | ORAL | Status: DC | PRN
  Administered 2023-05-04: 650 mg via ORAL
  Filled 2023-05-04: qty 2

## 2023-05-04 MED ORDER — ONDANSETRON 4 MG PO TBDP
4.0000 mg | ORAL_TABLET | Freq: Three times a day (TID) | ORAL | Status: DC | PRN
  Administered 2023-05-04 – 2023-05-05 (×2): 4 mg via ORAL
  Filled 2023-05-04 (×2): qty 1

## 2023-05-04 NOTE — ED Triage Notes (Addendum)
 EMS stated, "lives on [address] Diagnosed with UTI yesterday. Family gave pt UTI medication this morning. Pt's family said she was walking around with knife and saying she had demons in her stomach."  Pt is calm and cooperative, lives at home by herself. A&Ox4. Pt is able to recall events that led up to her coming here. Pt stated, "I was trying to hurt myself. I wasn't trying to scare my daughter. I'm just scared. I never felt like this before."

## 2023-05-04 NOTE — ED Notes (Signed)
 Pt crying in front of nurses desk. Stated, "my neck and back hurt."

## 2023-05-04 NOTE — ED Notes (Signed)
Belongings placed in locker # 7

## 2023-05-04 NOTE — ED Provider Notes (Signed)
 [place] EMERGENCY DEPARTMENT AT [facility] Provider Note   CSN: [id number] Arrival date & time: 05/04/23  1715     History  Chief Complaint  Patient presents with   Psychiatric Evaluation    Sevilla Murtagh is a 63 y.o. female.  Pt is a 63 yo female with pmhx significant for agoraphobia, fibromyalgia, bipolar d/o, ptsd, and borderline personality d/o.  Pt came to the ED last night for a burning sensation in her hands and feet.  She had some leukocytes in her urine and was dx'd with a uti.  She went home with her daughter and took her first dose of cipro this am.  Later today, she was found walking around daughter's house saying she has demons in her stomach and that she wanted to hurt herself.  She called EMS.  Pt's daughter told EMS she has a hx of schizophrenia, but I don't see that dx on her chart.         Home Medications Prior to Admission medications   Medication Sig Start Date End Date Taking? Authorizing Provider  ciprofloxacin (CIPRO) 500 MG tablet Take 1 tablet (500 mg total) by mouth 2 (two) times daily. One po bid x 7 days 05/03/23   Geoffery Lyons, MD  DULoxetine (CYMBALTA) 60 MG capsule Take 1 capsule (60 mg total) by mouth daily. 01/20/14   Thermon Leyland, NP  esomeprazole (NEXIUM) 40 MG capsule Take 1 capsule (40 mg total) by mouth daily. 01/20/14   Thermon Leyland, NP  loratadine (CLARITIN) 10 MG tablet Take 10 mg by mouth daily.    [provider]  multivitamin-lutein (OCUVITE-LUTEIN) CAPS capsule Take 1 capsule by mouth daily. 01/20/14   Thermon Leyland, NP  naproxen (NAPROSYN) 500 MG tablet Take 1 tablet (500 mg total) by mouth 2 (two) times daily with a meal. 01/30/16   Marcello Fennel, MD  ondansetron (ZOFRAN-ODT) 8 MG disintegrating tablet 8mg  ODT q4 hours prn nausea 05/03/23   Geoffery Lyons, MD  potassium chloride (K-DUR) 10 MEQ tablet Take 1 tablet (10 mEq total) by mouth daily. 04/02/15   Horton, Mayer Masker, MD  promethazine (PHENERGAN) 25 MG  tablet Take 25 mg by mouth every 8 (eight) hours as needed for nausea or vomiting.    [provider]  rizatriptan (MAXALT) 5 MG tablet Take 5 mg by mouth as needed for migraine. May repeat in 2 hours if needed    [provider]  Sennosides 25 MG TABS Take by mouth.    [provider]  simethicone (MYLICON) 125 MG chewable tablet Chew 125 mg by mouth every 6 (six) hours as needed for flatulence.    [provider]      Allergies    Bee venom, Brexpiprazole, Erythromycin, Penicillins, Fluoxetine hcl, Haloperidol decanoate, Lamotrigine, Lurasidone hcl, Quetiapine fumarate, Abilify [aripiprazole], Aripiprazole, Ciprocin-fluocin-procin [fluocinolone acetonide], Fexofenadine hcl, Gabapentin, Iodine, Lithium, Marcaine [bupivacaine hcl], Paroxetine hcl, Pregabalin, Remeron [mirtazapine], Seroquel [quetiapine fumarate], Tape, Zofran, and Zonisamide    Review of Systems   Review of Systems  Psychiatric/Behavioral:  Positive for suicidal ideas.   All other systems reviewed and are negative.   Physical Exam Updated Vital Signs BP 117/79 (BP Location: Left Arm)   Pulse 63   Temp 98.1 F (36.7 C) (Oral)   Resp 15   Ht 5\' 8"  (1.727 m)   Wt 80.7 kg   SpO2 96%   BMI 27.05 kg/m  Physical Exam Vitals and nursing note reviewed.  Constitutional:  Appearance: Normal appearance.  HENT:     Head: Normocephalic and atraumatic.     Right Ear: External ear normal.     Left Ear: External ear normal.     Nose: Nose normal.     Mouth/Throat:     Mouth: Mucous membranes are moist.     Pharynx: Oropharynx is clear.  Eyes:     Extraocular Movements: Extraocular movements intact.     Conjunctiva/sclera: Conjunctivae normal.     Pupils: Pupils are equal, round, and reactive to light.  Cardiovascular:     Rate and Rhythm: Normal rate and regular rhythm.     Pulses: Normal pulses.     Heart sounds: Normal heart sounds.  Pulmonary:     Effort: Pulmonary effort is  normal.     Breath sounds: Normal breath sounds.  Abdominal:     General: Abdomen is flat. Bowel sounds are normal.     Palpations: Abdomen is soft.  Musculoskeletal:        General: Normal range of motion.     Cervical back: Normal range of motion and neck supple.  Skin:    General: Skin is warm.     Capillary Refill: Capillary refill takes less than 2 seconds.  Neurological:     General: No focal deficit present.     Mental Status: She is alert and oriented to person, place, and time.  Psychiatric:        Mood and Affect: Mood is anxious.        Thought Content: Thought content includes suicidal ideation.     ED Results / Procedures / Treatments   Labs (all labs ordered are listed, but only abnormal results are displayed) Labs Reviewed  COMPREHENSIVE METABOLIC PANEL WITH GFR - Abnormal; Notable for the following components:      Result Value   Potassium 2.6 (*)    CO2 18 (*)    Glucose, Bld 139 (*)    Creatinine, Ser 1.38 (*)    GFR, Estimated 43 (*)    Anion gap 17 (*)    All other components within normal limits  RAPID URINE DRUG SCREEN, HOSP PERFORMED - Abnormal; Notable for the following components:   Opiates RESULTS UNAVAILABLE DUE TO INTERFERING SUBSTANCE (*)    Amphetamines RESULTS UNAVAILABLE DUE TO INTERFERING SUBSTANCE (*)    Tetrahydrocannabinol RESULTS UNAVAILABLE DUE TO INTERFERING SUBSTANCE (*)    All other components within normal limits  URINALYSIS, ROUTINE W REFLEX MICROSCOPIC - Abnormal; Notable for the following components:   Protein, ur 100 (*)    Bacteria, UA FEW (*)    Non Squamous Epithelial 0-5 (*)    All other components within normal limits  URINE CULTURE  ETHANOL  CBC WITH DIFFERENTIAL/PLATELET  MAGNESIUM  URINALYSIS, ROUTINE W REFLEX MICROSCOPIC  BASIC METABOLIC PANEL WITH GFR    EKG None  Radiology No results found.  Procedures Procedures    Medications Ordered in ED Medications  acetaminophen (TYLENOL) tablet 650 mg (650  mg Oral Given 05/04/23 1908)  LORazepam (ATIVAN) tablet 1 mg (1 mg Oral Given 05/04/23 1908)  potassium chloride SA (KLOR-CON M) CR tablet 40 mEq (has no administration in time range)  ondansetron (ZOFRAN-ODT) disintegrating tablet 4 mg (has no administration in time range)  ciprofloxacin (CIPRO) tablet 500 mg (has no administration in time range)    ED Course/ Medical Decision Making/ A&P  Medical Decision Making Amount and/or Complexity of Data Reviewed Labs: ordered.  Risk OTC drugs. Prescription drug management.   This patient presents to the ED for concern of si, this involves an extensive number of treatment options, and is a complaint that carries with it a high risk of complications and morbidity.  The differential diagnosis includes delirium from uti, psych, electrolyte abn   Co morbidities that complicate the patient evaluation  agoraphobia, fibromyalgia, bipolar d/o, ptsd, and borderline personality d/o   Additional history obtained:  Additional history obtained from epic chart review External records from outside source obtained and reviewed including EMS report   Lab Tests:  I Ordered, and personally interpreted labs.  The pertinent results include:  cbc nl, cmp nl other than k low at 2.6, etoh neg, mg nl, ua with protein, neg leuk, 21-50 wbcs   Medicines ordered and prescription drug management:  I ordered medication including tylenol/zofran/k  for sx  Reevaluation of the patient after these medicines showed that the patient improved I have reviewed the patients home medicines and have made adjustments as needed   Critical Interventions:  Psych consult   Consultations Obtained:  I requested consultation with TTS,  and discussed lab and imaging findings as well as pertinent plan - consult pending   Problem List / ED Course:  SI/psychosis:  likely due to underlying psych problems.  TTS consulted ? UTI:  pt does not have  uti sx.  Urine with few bacteria.  I will treat as she was told she had one yesterday, but will send urine for cx.  If it is negative, she can stop abx. Hypokalemia:  chronic for pt.  I will order a bmp in am.  She is given kdur tonight. Home med list:  med list has not been reviewed.  Awaiting review   Reevaluation:  After the interventions noted above, I reevaluated the patient and found that they have :improved   Social Determinants of Health:  Lives alone   Dispostion:  Pending TTS consult        Final Clinical Impression(s) / ED Diagnoses Final diagnoses:  Hypokalemia  Suicidal ideation  Acute cystitis with hematuria    Rx / DC Orders ED Discharge Orders     None         Jacalyn Lefevre, MD 05/04/23 2214

## 2023-05-04 NOTE — BH Assessment (Signed)
 TTS Note:   @ 9:56 PM, the patient was deferred to IRIS. The IRIS coordinator, Junie Panning has received the request. The patient's care team has been updated on the disposition. The IRIS coordinator will notify the care team when the IRIS provider is ready to begin the patient's assessment. If there are any questions, the care team should contact the IRIS coordinator at [phone]

## 2023-05-05 ENCOUNTER — Inpatient Hospital Stay
Admission: AD | Admit: 2023-05-05 | Discharge: 2023-05-17 | DRG: 885 | Disposition: A | Discharge status: Home / Self Care | Source: Intra-hospital | Attending: Psychiatry | Admitting: Psychiatry

## 2023-05-05 ENCOUNTER — Encounter: Payer: Self-pay | Admitting: Nurse Practitioner

## 2023-05-05 DIAGNOSIS — Z9151 Personal history of suicidal behavior: Secondary | ICD-10-CM | POA: Diagnosis not present

## 2023-05-05 DIAGNOSIS — F17210 Nicotine dependence, cigarettes, uncomplicated: Secondary | ICD-10-CM | POA: Diagnosis present

## 2023-05-05 DIAGNOSIS — F200 Paranoid schizophrenia: Secondary | ICD-10-CM | POA: Diagnosis present

## 2023-05-05 DIAGNOSIS — F4310 Post-traumatic stress disorder, unspecified: Secondary | ICD-10-CM | POA: Diagnosis present

## 2023-05-05 DIAGNOSIS — R45851 Suicidal ideations: Secondary | ICD-10-CM | POA: Diagnosis present

## 2023-05-05 DIAGNOSIS — F4001 Agoraphobia with panic disorder: Secondary | ICD-10-CM | POA: Diagnosis present

## 2023-05-05 DIAGNOSIS — F209 Schizophrenia, unspecified: Secondary | ICD-10-CM | POA: Diagnosis not present

## 2023-05-05 DIAGNOSIS — Z818 Family history of other mental and behavioral disorders: Secondary | ICD-10-CM

## 2023-05-05 DIAGNOSIS — M797 Fibromyalgia: Secondary | ICD-10-CM | POA: Diagnosis present

## 2023-05-05 DIAGNOSIS — Z9049 Acquired absence of other specified parts of digestive tract: Secondary | ICD-10-CM

## 2023-05-05 DIAGNOSIS — Z87440 Personal history of urinary (tract) infections: Secondary | ICD-10-CM | POA: Diagnosis not present

## 2023-05-05 DIAGNOSIS — Z90710 Acquired absence of both cervix and uterus: Secondary | ICD-10-CM | POA: Diagnosis not present

## 2023-05-05 DIAGNOSIS — F319 Bipolar disorder, unspecified: Secondary | ICD-10-CM | POA: Diagnosis present

## 2023-05-05 LAB — BASIC METABOLIC PANEL WITH GFR
Anion gap: 12 (ref 5–15)
Anion gap: 8 (ref 5–15)
BUN: 15 mg/dL (ref 8–23)
BUN: 15 mg/dL (ref 8–23)
CO2: 24 mmol/L (ref 22–32)
CO2: 26 mmol/L (ref 22–32)
Calcium: 8.5 mg/dL — ABNORMAL LOW (ref 8.9–10.3)
Calcium: 8.6 mg/dL — ABNORMAL LOW (ref 8.9–10.3)
Chloride: 100 mmol/L (ref 98–111)
Chloride: 103 mmol/L (ref 98–111)
Creatinine, Ser: 1.22 mg/dL — ABNORMAL HIGH (ref 0.44–1.00)
Creatinine, Ser: 1.19 mg/dL — ABNORMAL HIGH (ref 0.44–1.00)
GFR, Estimated: 50 mL/min — ABNORMAL LOW (ref >60)
GFR, Estimated: 52 mL/min — ABNORMAL LOW (ref >60)
Glucose, Bld: 107 mg/dL — ABNORMAL HIGH (ref 70–99)
Glucose, Bld: 92 mg/dL (ref 70–99)
Potassium: 2.7 mmol/L — CL (ref 3.5–5.1)
Potassium: 3 mmol/L — ABNORMAL LOW (ref 3.5–5.1)
Sodium: 136 mmol/L (ref 135–145)
Sodium: 137 mmol/L (ref 135–145)

## 2023-05-05 LAB — RESP PANEL BY RT-PCR (RSV, FLU A&B, COVID)  RVPGX2
Influenza A by PCR: NEGATIVE
Influenza B by PCR: NEGATIVE
Resp Syncytial Virus by PCR: NEGATIVE
SARS Coronavirus 2 by RT PCR: NEGATIVE

## 2023-05-05 MED ORDER — OLANZAPINE 5 MG PO TABS
2.5000 mg | ORAL_TABLET | Freq: Three times a day (TID) | ORAL | Status: DC | PRN
Start: 2023-05-05 — End: 2023-05-05
  Administered 2023-05-05: 2.5 mg via ORAL
  Filled 2023-05-05: qty 1

## 2023-05-05 MED ORDER — OLANZAPINE 5 MG PO TABS
5.0000 mg | ORAL_TABLET | Freq: Every day | ORAL | Status: DC
  Administered 2023-05-05 – 2023-05-06 (×2): 5 mg via ORAL
  Filled 2023-05-05 (×2): qty 1

## 2023-05-05 MED ORDER — MAGNESIUM HYDROXIDE 400 MG/5ML PO SUSP
30.0000 mL | Freq: Every day | ORAL | Status: DC | PRN
  Administered 2023-05-11 – 2023-05-13 (×2): 30 mL via ORAL
  Filled 2023-05-05 (×3): qty 30

## 2023-05-05 MED ORDER — POTASSIUM CHLORIDE CRYS ER 20 MEQ PO TBCR
40.0000 meq | EXTENDED_RELEASE_TABLET | Freq: Every day | ORAL | Status: DC
  Administered 2023-05-05: 40 meq via ORAL
  Filled 2023-05-05: qty 2

## 2023-05-05 MED ORDER — CIPROFLOXACIN HCL 500 MG PO TABS
500.0000 mg | ORAL_TABLET | Freq: Two times a day (BID) | ORAL | Status: AC
  Administered 2023-05-05 – 2023-05-11 (×12): 500 mg via ORAL
  Filled 2023-05-05 (×12): qty 1

## 2023-05-05 MED ORDER — ACETAMINOPHEN 325 MG PO TABS
650.0000 mg | ORAL_TABLET | ORAL | Status: DC | PRN
  Administered 2023-05-05 – 2023-05-17 (×27): 650 mg via ORAL
  Filled 2023-05-05 (×27): qty 2

## 2023-05-05 MED ORDER — POTASSIUM CHLORIDE CRYS ER 20 MEQ PO TBCR
40.0000 meq | EXTENDED_RELEASE_TABLET | Freq: Every day | ORAL | Status: AC
  Administered 2023-05-06 – 2023-05-07 (×2): 40 meq via ORAL
  Filled 2023-05-05 (×2): qty 2

## 2023-05-05 MED ORDER — OLANZAPINE 10 MG IM SOLR
5.0000 mg | Freq: Three times a day (TID) | INTRAMUSCULAR | Status: DC | PRN
  Administered 2023-05-08: 5 mg via INTRAMUSCULAR
  Filled 2023-05-05: qty 10

## 2023-05-05 MED ORDER — OLANZAPINE 5 MG PO TABS
2.5000 mg | ORAL_TABLET | Freq: Three times a day (TID) | ORAL | Status: DC | PRN
  Administered 2023-05-08: 2.5 mg via ORAL
  Filled 2023-05-05: qty 1

## 2023-05-05 MED ORDER — OLANZAPINE 5 MG PO TABS: 5.0000 mg | ORAL_TABLET | Freq: Every day | ORAL | Status: DC

## 2023-05-05 MED ORDER — LORAZEPAM 1 MG PO TABS
1.0000 mg | ORAL_TABLET | Freq: Three times a day (TID) | ORAL | Status: DC | PRN
  Administered 2023-05-05 – 2023-05-09 (×7): 1 mg via ORAL
  Filled 2023-05-05 (×8): qty 1

## 2023-05-05 MED ORDER — ONDANSETRON 4 MG PO TBDP
4.0000 mg | ORAL_TABLET | Freq: Three times a day (TID) | ORAL | Status: DC | PRN
  Administered 2023-05-05 – 2023-05-17 (×17): 4 mg via ORAL
  Filled 2023-05-05 (×19): qty 1

## 2023-05-05 MED ORDER — ALUM & MAG HYDROXIDE-SIMETH 200-200-20 MG/5ML PO SUSP
30.0000 mL | ORAL | Status: DC | PRN
  Administered 2023-05-12 – 2023-05-16 (×4): 30 mL via ORAL
  Filled 2023-05-05 (×4): qty 30

## 2023-05-05 NOTE — Group Note (Signed)
 Recreation Therapy Group Note   Group Topic:Communication  Group Date: 05/05/2023 Start Time: 1400 End Time: 1500 Facilitators: Rosina Lowenstein, LRT, CTRS Location: Courtyard  Group Description: Trivia. LRT reads off trivia question for patients to hear while also giving them a copy of the trivia questions. Pts are given a set time limit to answer the question correctly. LRT waits for all patients to make a guess out loud. LRT facilitated post-game discussion on the importance of working well with others, active listening, and communication. LRT and pts discussed how this can apply to life post-discharge.   Goal Area(s) Addressed: Patient will increase communication skills.  Patient will increase frustration tolerance skills. Patient will practice active listening.    Affect/Mood: N/A   Participation Level: Did not attend    Clinical Observations/Individualized Feedback: Patient did not attend group.   Plan: Continue to engage patient in RT group sessions 2-3x/week.   Rosina Lowenstein, LRT, CTRS 05/05/2023 4:38 PM

## 2023-05-05 NOTE — ED Notes (Signed)
 Patient transferred via safe transport to [facility] geriatric psych. Patient ambulatory with steady gait x 1 assist. Patient's belongings in two bags and paperwork including cellphone given to transporter.

## 2023-05-05 NOTE — ED Notes (Signed)
 Patient ambulated to restroom and given cup of water.

## 2023-05-05 NOTE — ED Notes (Signed)
 Tele cart at beside for TTS.

## 2023-05-05 NOTE — Plan of Care (Addendum)
 Patient is a voluntary admission to [facility] for Bipolar - non medicaton compliance and AVH with UTI. Hx of Bipolar, Anxiety with Agoraphobia, and Personality Disorder.  Patient is cooperative but labile, especially in regards to rules for the unit. While in the ED was ambulating unassisted but here she is ataxic and needed a wheelchair.  Once in the room she was walking with a walker with no assistance or notes of ataxia. Patient states she is agoraphobic and unable to leave her house in 8 years and unable to go to doctor's appts to get medications.  Her uncle pays her rent and bills.  Has a daughter that patient states is her POA. Patient is currently on cipro for uti dx 2 days ago.  Denies SI, HI, and depression.  Endorses AVH and anxiety 8/10. Goal is to have group counseling with her daughter whom she states is now afraid of her. Report is she had a knife while hallucinating and wanted to "cut the demons out". Patient oriented to the unit. Will continue to monitor. 2:30pm - Patient refused to sign paperwork, then grab the pen out of my hand and started to sign then refused. When I reached for the pen she screamed. When I explained I'm only getting my pen back, she continued exhibiting signs of paranoia and some confusion.  Will continue to monitor.

## 2023-05-05 NOTE — ED Provider Notes (Signed)
 Emergency Medicine Observation Re-evaluation Note  Vicki Thomas is a 63 y.o. female, seen on rounds today.  Pt initially presented to the ED for complaints of Psychiatric Evaluation Currently, the patient is resting quietly.  Physical Exam  BP 104/70 (BP Location: Right Arm)   Pulse 65   Temp 98.2 F (36.8 C) (Oral)   Resp 17   Ht 5\' 8"  (1.727 m)   Wt 80.7 kg   SpO2 96%   BMI 27.05 kg/m  Physical Exam General: No acute distress Cardiac: Well-perfused Lungs: Nonlabored Psych: Calm  ED Course / MDM  EKG:   I have reviewed the labs performed to date as well as medications administered while in observation.  Recent changes in the last 24 hours include medical clearance.  Plan  Current plan is for psychiatric evaluation.  9 AM.  Patient evaluated by psychiatry and feels she would benefit from inpatient placement.  They are adding some Zyprexa to her medication regiment.  They wanted a repeat potassium and a COVID swab.  She is currently voluntary but they recommend IVC if she chooses to leave.  10:30 AM.  Patient has been accepted to [facility] behavioral health for admission.   Terrilee Files, MD 05/05/23 1728

## 2023-05-05 NOTE — Discharge Instructions (Signed)
 Continue potassium repletion 40 mill equivalents daily and recheck in a few days.

## 2023-05-05 NOTE — ED Notes (Signed)
Patient eating Breakfast.  

## 2023-05-05 NOTE — Group Note (Signed)
 Date:  05/05/2023 Time:  10:18 PM  Group Topic/Focus:  Managing Feelings:   The focus of this group is to identify what feelings patients have difficulty handling and develop a plan to handle them in a healthier way upon discharge.    Participation Level:  Minimal  Participation Quality:  Appropriate  Affect:  Appropriate  Cognitive:  Appropriate  Insight: Appropriate  Engagement in Group:  Limited  Modes of Intervention:  Education  Additional Comments:    Garry Heater 05/05/2023, 10:18 PM

## 2023-05-05 NOTE — ED Notes (Signed)
[facility] called to transport patient.
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
## 2023-05-05 NOTE — Progress Notes (Signed)
 Pt was accepted to [facility] 05/05/2023 Bed Assignment L 36   Address: [address] [place], [place], [place] [address]  -[facility] Rolena Infante Fax: [phone]  Pt meets inpatient criteria per   Attending Physician will be Dr. Callie Fielding  Report can be called to: [phone]  Pt can arrive after VOL consent is received   Care Team notified:Linsey Strader RN, Alfredia Client RN, Eligha Bridegroom NP,    [place] Jayr Lupercio  MSW, [facility] 05/05/2023 10:25 AM

## 2023-05-05 NOTE — ED Notes (Signed)
 Pt still a/w TTS

## 2023-05-05 NOTE — Progress Notes (Signed)
   05/05/23 2000  Psych Admission Type (Psych Patients Only)  Admission Status Voluntary  Psychosocial Assessment  Patient Complaints Anxiety  Eye Contact Fair  Facial Expression Flat  Affect Anxious;Labile  Speech Logical/coherent  Interaction Needy  Motor Activity Slow  Appearance/Hygiene Disheveled  Behavior Characteristics Anxious  Mood Anxious;Labile;Pleasant  Thought Process  Coherency WDL  Content Blaming self;Preoccupation  Delusions None reported or observed  Perception WDL  Hallucination None reported or observed  Judgment Limited  Confusion Mild  Danger to Self  Current suicidal ideation? Denies  Agreement Not to Harm Self Yes  Description of Agreement verbal  Danger to Others  Danger to Others None reported or observed

## 2023-05-06 DIAGNOSIS — F209 Schizophrenia, unspecified: Secondary | ICD-10-CM

## 2023-05-06 LAB — URINE CULTURE: Culture: NO GROWTH

## 2023-05-06 NOTE — BHH Counselor (Signed)
 Patient completed PSA but declined to sign consents due to paranoia.  Patient stated "you're going to talk about drugs" and would not allow or listen to CSW as CSW attempted to clarify.  Patient also declined to sign for referral for treatment, stating that her family will complete.    Patient declined to sign Medicare IM as well.  Penni Homans, MSW, LCSW 05/06/2023 2:28 PM

## 2023-05-06 NOTE — BHH Suicide Risk Assessment (Signed)
 [facility] Admission Suicide Risk Assessment   Nursing information obtained from:  Patient Demographic factors:  Caucasian, Living alone Current Mental Status:  NA Loss Factors:  Decline in physical health Historical Factors:  NA Risk Reduction Factors:  Positive social support  Total Time spent with patient: 30 minutes Principal Problem: Schizophrenia (HCC) Diagnosis:  Principal Problem:   Schizophrenia (HCC)  Subjective Data: Vicki Thomas is a 63 y.o. female admitted: Presented to the EDfor 05/04/2023  5:23 PM for hallucinations, suicidal ideations. She carries the psychiatric diagnoses of schizophrenia, bipolar and has a past medical history of  UTI. Her current presentation of suicidal ideations, hallucinations, paranoia is most consistent with schizophrenia.Patient is admitted to [facility] unit with Q15 min safety monitoring. Multidisciplinary team approach is offered. Medication management; group/milieu therapy is offered.   Continued Clinical Symptoms:  Alcohol Use Disorder Identification Test Final Score (AUDIT): 0 The "Alcohol Use Disorders Identification Test", Guidelines for Use in Primary Care, Second Edition.  World Science writer [facility]). Score between 0-7:  no or low risk or alcohol related problems. Score between 8-15:  moderate risk of alcohol related problems. Score between 16-19:  high risk of alcohol related problems. Score 20 or above:  warrants further diagnostic evaluation for alcohol dependence and treatment.   CLINICAL FACTORS:   Severe Anxiety and/or Agitation Schizophrenia:   Paranoid or undifferentiated type   Musculoskeletal: Strength & Muscle Tone: within normal limits Gait & Station: normal Patient leans: N/A  Psychiatric Specialty Exam:  Presentation  General Appearance:  Appropriate for Environment; Casual  Eye Contact: Minimal  Speech: Normal Rate  Speech Volume: Decreased  Handedness: Right   Mood and Affect  Mood: Anxious;  Depressed  Affect: Labile   Thought Process  Thought Processes: Irrevelant  Descriptions of Associations:Intact  Orientation:Partial  Thought Content:Illogical; Paranoid Ideation  History of Schizophrenia/Schizoaffective disorder:Yes  Duration of Psychotic Symptoms:Greater than six months  Hallucinations:Hallucinations: None  Ideas of Reference:Paranoia  Suicidal Thoughts:Suicidal Thoughts: No  Homicidal Thoughts:Homicidal Thoughts: No   Sensorium  Memory: Immediate Fair; Recent Poor; Remote Poor  Judgment: Impaired  Insight: Shallow   Executive Functions  Concentration: Poor  Attention Span: Poor  Recall: Poor  Fund of Knowledge: Poor  Language: Poor   Psychomotor Activity  Psychomotor Activity: Psychomotor Activity: Normal   Assets  Assets: Communication Skills; Social Support   Sleep  Sleep: Sleep: Poor    Physical Exam: Physical Exam ROS Blood pressure (!) 154/82, pulse 76, temperature 98.5 F (36.9 C), resp. rate 16, height 5\' 7"  (1.702 m), weight 67.4 kg, SpO2 100%. Body mass index is 23.26 kg/m.   COGNITIVE FEATURES THAT CONTRIBUTE TO RISK:  None    SUICIDE RISK:   Mild:  Suicidal ideation of limited frequency, intensity, duration, and specificity.  There are no identifiable plans, no associated intent, mild dysphoria and related symptoms, good self-control (both objective and subjective assessment), few other risk factors, and identifiable protective factors, including available and accessible social support.  PLAN OF CARE: Patient is admitted to [facility] psych unit with Q15 min safety monitoring. Multidisciplinary team approach is offered. Medication management; group/milieu therapy is offered.   I certify that inpatient services furnished can reasonably be expected to improve the patient's condition.   Verner Chol, MD 05/06/2023, 2:54 PM

## 2023-05-06 NOTE — BH IP Treatment Plan (Signed)
 Interdisciplinary Treatment and Diagnostic Plan Update  05/06/2023 Time of Session: 10:29AM Vicki Thomas MRN: [id number]  Principal Diagnosis: Schizophrenia [facility])  Secondary Diagnoses: Principal Problem:   Schizophrenia (HCC)   Current Medications:  Current Facility-Administered Medications  Medication Dose Route Frequency Provider Last Rate Last Admin   acetaminophen (TYLENOL) tablet 650 mg  650 mg Oral Q4H PRN Eligha Bridegroom, NP   650 mg at 05/05/23 1753   alum & mag hydroxide-simeth (MAALOX/MYLANTA) 200-200-20 MG/5ML suspension 30 mL  30 mL Oral Q4H PRN Eligha Bridegroom, NP       ciprofloxacin (CIPRO) tablet 500 mg  500 mg Oral BID Eligha Bridegroom, NP   500 mg at 05/06/23 1048   LORazepam (ATIVAN) tablet 1 mg  1 mg Oral TID PRN Eligha Bridegroom, NP   1 mg at 05/06/23 0000   magnesium hydroxide (MILK OF MAGNESIA) suspension 30 mL  30 mL Oral Daily PRN Eligha Bridegroom, NP       OLANZapine (ZYPREXA) injection 5 mg  5 mg Intramuscular TID PRN Eligha Bridegroom, NP       OLANZapine (ZYPREXA) tablet 2.5 mg  2.5 mg Oral Q8H PRN Eligha Bridegroom, NP       OLANZapine (ZYPREXA) tablet 5 mg  5 mg Oral QHS Eligha Bridegroom, NP   5 mg at 05/05/23 2032   ondansetron (ZOFRAN-ODT) disintegrating tablet 4 mg  4 mg Oral Q8H PRN Eligha Bridegroom, NP   4 mg at 05/06/23 1054   potassium chloride SA (KLOR-CON M) CR tablet 40 mEq  40 mEq Oral Daily Eligha Bridegroom, NP   40 mEq at 05/06/23 1045   PTA Medications: Medications Prior to Admission  Medication Sig Dispense Refill Last Dose/Taking   aspirin-sod bicarb-citric acid (ALKA-SELTZER) 325 MG TBEF tablet Take 325 mg by mouth every 6 (six) hours as needed.      bisacodyl (DULCOLAX) 5 MG EC tablet Take 5 mg by mouth daily as needed for moderate constipation.      ciprofloxacin (CIPRO) 500 MG tablet Take 1 tablet (500 mg total) by mouth 2 (two) times daily. One po bid x 7 days (Patient not taking: Reported on 05/05/2023) 14 tablet 0    guaiFENesin  (ROBITUSSIN) 100 MG/5ML liquid Take 5 mLs by mouth every 4 (four) hours as needed for cough or to loosen phlegm.      ibuprofen (ADVIL) 200 MG tablet Take 400 mg by mouth every 6 (six) hours as needed for moderate pain (pain score 4-6).      OVER THE COUNTER MEDICATION Indegestion and gas      polyethylene glycol (MIRALAX / GLYCOLAX) 17 g packet Take 17 g by mouth daily.      Potassium 99 MG TABS Take 1 tablet by mouth daily.      potassium chloride (K-DUR) 10 MEQ tablet Take 1 tablet (10 mEq total) by mouth daily. (Patient not taking: Reported on 05/05/2023) 10 tablet 0    Pseudoeph-Doxylamine-DM-APAP (NYQUIL MULTI-SYMPTOM PO) Take by mouth.      simethicone (MYLICON) 125 MG chewable tablet Chew 125 mg by mouth every 6 (six) hours as needed for flatulence.      Zinc Oxide 16 % OINT Apply topically.       Patient Stressors:    Patient Strengths:    Treatment Modalities: Medication Management, Group therapy, Case management,  1 to 1 session with clinician, Psychoeducation, Recreational therapy.   Physician Treatment Plan for Primary Diagnosis: Schizophrenia [facility]) Long Term Goal(s):     Short Term Goals:  Medication Management: Evaluate patient's response, side effects, and tolerance of medication regimen.  Therapeutic Interventions: 1 to 1 sessions, Unit Group sessions and Medication administration.  Evaluation of Outcomes: Not Met  Physician Treatment Plan for Secondary Diagnosis: Principal Problem:   Schizophrenia (HCC)  Long Term Goal(s):     Short Term Goals:       Medication Management: Evaluate patient's response, side effects, and tolerance of medication regimen.  Therapeutic Interventions: 1 to 1 sessions, Unit Group sessions and Medication administration.  Evaluation of Outcomes: Not Met   RN Treatment Plan for Primary Diagnosis: Schizophrenia (HCC) Long Term Goal(s): Knowledge of disease and therapeutic regimen to maintain health will improve  Short Term  Goals: Ability to demonstrate self-control, Ability to participate in decision making will improve, Ability to verbalize feelings will improve, Ability to disclose and discuss suicidal ideas, Ability to identify and develop effective coping behaviors will improve, and Compliance with prescribed medications will improve  Medication Management: RN will administer medications as ordered by provider, will assess and evaluate patient's response and provide education to patient for prescribed medication. RN will report any adverse and/or side effects to prescribing provider.  Therapeutic Interventions: 1 on 1 counseling sessions, Psychoeducation, Medication administration, Evaluate responses to treatment, Monitor vital signs and CBGs as ordered, Perform/monitor CIWA, COWS, AIMS and Fall Risk screenings as ordered, Perform wound care treatments as ordered.  Evaluation of Outcomes: Not Met   LCSW Treatment Plan for Primary Diagnosis: Schizophrenia (HCC) Long Term Goal(s): Safe transition to appropriate next level of care at discharge, Engage patient in therapeutic group addressing interpersonal concerns.  Short Term Goals: Engage patient in aftercare planning with referrals and resources, Increase social support, Increase ability to appropriately verbalize feelings, Increase emotional regulation, Facilitate acceptance of mental health diagnosis and concerns, and Increase skills for wellness and recovery  Therapeutic Interventions: Assess for all discharge needs, 1 to 1 time with Social worker, Explore available resources and support systems, Assess for adequacy in community support network, Educate family and significant other(s) on suicide prevention, Complete Psychosocial Assessment, Interpersonal group therapy.  Evaluation of Outcomes: Not Met   Progress in Treatment: Attending groups: Yes. Participating in groups: Yes. Taking medication as prescribed: Yes. Toleration medication:  Yes. Family/Significant other contact made: No, will contact:  once permission has been granted.  Patient understands diagnosis: Yes. Discussing patient identified problems/goals with staff: Yes. Medical problems stabilized or resolved: Yes. Denies suicidal/homicidal ideation: Yes. Issues/concerns per patient self-inventory: No. Other: none  New problem(s) identified: No, Describe:  none  New Short Term/Long Term Goal(s): detox, elimination of symptoms of psychosis, medication management for mood stabilization; elimination of SI thoughts; development of comprehensive mental wellness/sobriety plan.   Patient Goals:  "to get into some program"  Discharge Plan or Barriers: CSW to assist patient in development of appropriate discharge plans.   Reason for Continuation of Hospitalization: Anxiety Depression Medication stabilization Suicidal ideation  Estimated Length of Stay:  1-7 days  Last 3 [place] Suicide Severity Risk Score: Flowsheet Row Admission (Current) from 05/05/2023 in [facility] ED from 05/04/2023 in [facility] Department at [facility] ED from 05/03/2023 in [facility] Emergency Department at [facility]  C-SSRS RISK CATEGORY High Risk High Risk No Risk       Last [facility] 2/9 Scores:    12/10/2015   12:18 PM  Depression screen PHQ 2/9  Decreased Interest 3  Down, Depressed, Hopeless 2  PHQ - 2 Score 5  Altered sleeping  3  Tired, decreased energy 1  Change in appetite 3  Feeling bad or failure about yourself  3  Trouble concentrating 1  Moving slowly or fidgety/restless 3  Suicidal thoughts 0  PHQ-9 Score 19  Difficult doing work/chores Somewhat difficult    Scribe for Treatment Team: Harden Mo, LCSW 05/06/2023 11:09 AM

## 2023-05-06 NOTE — Progress Notes (Signed)
   05/06/23 1700  Psych Admission Type (Psych Patients Only)  Admission Status Voluntary  Psychosocial Assessment  Patient Complaints Anxiety  Eye Contact Fair  Facial Expression Flat  Affect Anxious;Labile  Speech Soft;Word salad  Interaction Needy  Motor Activity Slow  Appearance/Hygiene Disheveled  Behavior Characteristics Anxious  Mood Labile;Anxious;Pleasant  Thought Process  Coherency WDL  Content Blaming self;Preoccupation;Delusions  Delusions Controlling  Perception Derealization  Hallucination Auditory  Judgment Impaired  Confusion Moderate  Danger to Self  Current suicidal ideation? Denies  Agreement Not to Harm Self Yes  Description of Agreement Verbal  Danger to Others  Danger to Others None reported or observed
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
## 2023-05-06 NOTE — Progress Notes (Signed)
   05/06/23 0600  15 Minute Checks  Location Bedroom  Visual Appearance Calm  Behavior Composed  Sleep (Behavioral Health Patients Only)  Calculate sleep? (Click Yes once per 24 hr at 0600 safety check) Yes  Documented sleep last 24 hours 5.5
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
## 2023-05-06 NOTE — BHH Suicide Risk Assessment (Signed)
 BHH INPATIENT:  Family/Significant Other Suicide Prevention Education  Suicide Prevention Education:  Education Completed; Tamera Punt, [phone] daughter has been identified by the patient as the family member/significant other with whom the patient will be residing, and identified as the person(s) who will aid the patient in the event of a mental health crisis (suicidal ideations/suicide attempt).  With written consent from the patient, the family member/significant other has been provided the following suicide prevention education, prior to the and/or following the discharge of the patient.  The suicide prevention education provided includes the following: Suicide risk factors Suicide prevention and interventions National Suicide Hotline telephone number [facility] assessment telephone number [facility] Emergency Assistance 911 [facility] and/or Residential Mobile Crisis Unit telephone number  Request made of family/significant other to: Remove weapons (e.g., guns, rifles, knives), all items previously/currently identified as safety concern.   Remove drugs/medications (over-the-counter, prescriptions, illicit drugs), all items previously/currently identified as a safety concern.  The family member/significant other verbalizes understanding of the suicide prevention education information provided.  The family member/significant other agrees to remove the items of safety concern listed above.  Harden Mo 05/06/2023, 4:29 PM

## 2023-05-06 NOTE — Group Note (Signed)
 Date:  05/06/2023 Time:  11:59 PM  Group Topic/Focus:  Personal Choices and Values:   The focus of this group is to help patients assess and explore the importance of values in their lives, how their values affect their decisions, how they express their values and what opposes their expression.    Participation Level:  Active  Participation Quality:  Appropriate  Affect:  Appropriate  Cognitive:  Appropriate  Insight: Good  Engagement in Group:  Engaged  Modes of Intervention:  Discussion  Additional Comments:    [name] 05/06/2023, 11:59 PM

## 2023-05-06 NOTE — Group Note (Signed)
 Date:  05/06/2023 Time:  10:48 AM  Group Topic/Focus:  Crisis Planning:   The purpose of this group is to help patients create a crisis plan for use upon discharge or in the future, as needed.    Participation Level:  Active  Participation Quality:  Appropriate  Affect:  Appropriate  Cognitive:  Appropriate  Insight: Appropriate  Engagement in Group:  Engaged  Modes of Intervention:  Activity   Ardelle Anton 05/06/2023, 10:48 AM

## 2023-05-06 NOTE — Group Note (Signed)
 Recreation Therapy Group Note   Group Topic:Leisure Education  Group Date: 05/06/2023 Start Time: 1400 End Time: 1450 Facilitators: Rosina Lowenstein, LRT, CTRS Location:  Dayroom  Group Description: Bingo. LRT and patients played multiple games of Bingo with music playing in the background. LRT and pts discussed how this could be a leisure interest and the importance of doing things they enjoy post-discharge. Pts won stress balls as Chief Financial Officer.    Goal Area(s) Addressed: Patient will identify leisure interests.  Patient will practice healthy decision making. Patient will engage in recreation activity.  Patient will increase communication.   Affect/Mood: N/A   Participation Level: Did not attend    Clinical Observations/Individualized Feedback: Patient did not attend group.    Plan: Continue to engage patient in RT group sessions 2-3x/week.   Rosina Lowenstein, LRT, CTRS 05/06/2023 4:26 PM

## 2023-05-06 NOTE — Plan of Care (Signed)
   Problem: Education: Goal: Emotional status will improve Outcome: Not Progressing Goal: Mental status will improve Outcome: Not Progressing

## 2023-05-06 NOTE — Group Note (Signed)
 Therapy Group Note  Group Topic: Neurographic Art  Group Date: 05/06/2023 Start Time: 1300 End Time: 1400 Facilitators: Lottie Mussel, OT    Group Description: Group participated with Neurographic art activity, using watercolor paints to facilitate creative expression and meditation/relaxation for each individual.  Incorporated bimanual coordination, mental focus, emotional processing, task/command following and relaxation techniques as appropriate.  Patients engaged socially with therapist and other group participants throughout session. Allowed to ask questions as appropriate, and encouraged to identify ways they could use/share their creations with themselves and others.   Therapeutic Goal(s): Demonstrate ability to independently manipulate utensils required to participate with and complete activity. Demonstrate ability to cognitively focus on task and follow commands necessary for completion. Demonstrate use of art as an outlet for emotional processing and expression. Identify and demonstrate importance of relaxation, neural calming and meditation for improved participation with life groups.   Individual Participation: Pt joined group a few minutes late, agreeable to participate. Pt participated throughout, only conversing briefly with therapist when prompted and able to make needs known when she endorsed pain. RN notified per pt's request. Pt educated in positioning modifications while in w/c to improve comfort and pt able to implement as needed. Pt required additional cues for technique with fair carryover.   Participation Level: Engaged   Participation Quality: Minimal Cues   Behavior: Alert, Appropriate, Calm, Passive, and Reserved   Speech/Thought Process: Coherent and Relevant   Affect/Mood: Flat   Insight: Fair   Judgement: Fair   Modes of Intervention: Activity, Clarification, Discussion, Education, Exploration, Problem-solving, and Socialization  Patient Response to  Interventions:  Attentive and Receptive   Plan: Continue to engage patient in OT groups 1-2x/week.   Arman Filter., MPH, MS, OTR/L ascom [phone] 05/06/23, 3:49 PM
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
## 2023-05-07 DIAGNOSIS — F209 Schizophrenia, unspecified: Secondary | ICD-10-CM | POA: Diagnosis not present

## 2023-05-07 MED ORDER — OLANZAPINE 5 MG PO TABS
7.5000 mg | ORAL_TABLET | Freq: Every day | ORAL | Status: DC
  Administered 2023-05-07 – 2023-05-10 (×3): 7.5 mg via ORAL
  Filled 2023-05-07 (×4): qty 2

## 2023-05-07 NOTE — Progress Notes (Signed)
   05/06/23 2128  Psych Admission Type (Psych Patients Only)  Admission Status Voluntary  Psychosocial Assessment  Patient Complaints Anxiety  Eye Contact Fair  Facial Expression Flat  Affect Anxious  Speech Soft  Interaction Needy  Motor Activity Slow  Appearance/Hygiene Disheveled  Behavior Characteristics Anxious  Mood Anxious;Preoccupied;Sad ("I can't understand how no one could help me w/this UTI")  Aggressive Behavior  Effect No apparent injury  Thought Process  Coherency WDL  Content Preoccupation  Delusions None reported or observed  Perception WDL  Hallucination None reported or observed  Judgment Limited  Confusion Mild  Danger to Self  Current suicidal ideation? Denies  Agreement Not to Harm Self Yes  Description of Agreement Verbal  Danger to Others  Danger to Others None reported or observed

## 2023-05-07 NOTE — Group Note (Signed)
 Date:  05/07/2023 Time:  11:10 PM  Group Topic/Focus:  Healthy Communication:   The focus of this group is to discuss communication, barriers to communication, as well as healthy ways to communicate with others.    Participation Level:  Active  Participation Quality:  Appropriate  Affect:  Appropriate  Cognitive:  Appropriate  Insight: Appropriate and Good  Engagement in Group:  Engaged  Modes of Intervention:  Discussion  Additional Comments:    Lynette Saras 05/07/2023, 11:10 PM

## 2023-05-07 NOTE — Progress Notes (Signed)
 [facility] MD Progress Note  05/07/2023 2:21 PM Vicki Thomas  MRN:  [id number]  Vicki Thomas is a 63 y.o. female admitted: Presented to the EDfor 05/04/2023  5:23 PM for hallucinations, suicidal ideations. She carries the psychiatric diagnoses of schizophrenia, bipolar and has a past medical history of  UTI. Her current presentation of suicidal ideations, hallucinations, paranoia is most consistent with schizophrenia.Patient is admitted to [facility] unit with Q15 min safety monitoring. Multidisciplinary team approach is offered. Medication management; group/milieu therapy is offered.  Subjective:  Chart reviewed, case discussed in multidisciplinary meeting, patient seen during rounds.  On interview patient reports that she is feeling upset stomach but denies any diarrhea or vomiting.  Provider and patient discussed about the medications.  She reports she was tried on Seroquel, Abilify, Risperdal, Latuda and on chart review it seems that patient has listed all the psychotropic medications as allergies except for Zyprexa.  In the emergency room patient was started on Zyprexa 5 mg nightly.  Called daughter multiple times throughout the day and it went to voicemail that is full.  Patient reports depression 5 out of 1010 being worst and anxiety 8 out of 10.  She endorses feeling paranoia and intermittent voices with no specific details.  She reports passive SI with no active intent or plan   Sleep: Fair  Appetite:  Fair  Past Psychiatric History: see h&P Family History:  Family History  Problem Relation Age of Onset   Alcohol abuse Mother    Bipolar disorder Father    Paranoid behavior Father    Alcohol abuse Father    Bipolar disorder Sister    Anxiety disorder Sister    Alcohol abuse Brother    Drug abuse Brother    Bipolar disorder Paternal Aunt    Schizophrenia Maternal Grandfather    Anxiety disorder Maternal Aunt    Social History:  Social History   Substance and Sexual Activity  Alcohol  Use No     Social History   Substance and Sexual Activity  Drug Use No    Social History   Socioeconomic History   Marital status: Single    Spouse name: Not on file   Number of children: Not on file   Years of education: Not on file   Highest education level: Not on file  Occupational History   Not on file  Tobacco Use   Smoking status: Former    Current packs/day: 1.00    Average packs/day: 1 pack/day for 6.0 years (6.0 ttl pk-yrs)    Types: Cigarettes   Smokeless tobacco: Never  Vaping Use   Vaping status: Never Used  Substance and Sexual Activity   Alcohol use: No   Drug use: No   Sexual activity: Not Currently  Other Topics Concern   Not on file  Social History Narrative   Not on file   Social Drivers of Health   Financial Resource Strain: Not on file  Food Insecurity: Patient Declined (05/05/2023)   Hunger Vital Sign    Worried About Running Out of Food in the Last Year: Patient declined    Ran Out of Food in the Last Year: Patient declined  Transportation Needs: No Transportation Needs (05/05/2023)   PRAPARE - Administrator, Civil Service (Medical): No    Lack of Transportation (Non-Medical): No  Physical Activity: Not on file  Stress: Not on file  Social Connections: Unknown (05/30/2021)   Received from [facility]   Social Network  Social Network: Not on file   Past Medical History:  Past Medical History:  Diagnosis Date   Anxiety    Arthritis    Bipolar 1 disorder (HCC)    Chronic back pain    Chronic fatigue    Chronic headaches    Chronic pain    Fibromyalgia    Hiatal hernia    History of borderline personality disorder    Interstitial cystitis    Lymphedema    Panic disorder with agoraphobia    PTSD (post-traumatic stress disorder)     Past Surgical History:  Procedure Laterality Date   ABDOMINAL HYSTERECTOMY     APPENDECTOMY     BARIATRIC SURGERY     CHOLECYSTECTOMY     ESOPHAGUS SURGERY     FOOT SURGERY      HEMORRHOID SURGERY     HERNIA REPAIR     TONSILLECTOMY      Current Medications: Current Facility-Administered Medications  Medication Dose Route Frequency Provider Last Rate Last Admin   acetaminophen (TYLENOL) tablet 650 mg  650 mg Oral Q4H PRN Roise Cleaver, NP   650 mg at 05/07/23 0648   alum & mag hydroxide-simeth (MAALOX/MYLANTA) 200-200-20 MG/5ML suspension 30 mL  30 mL Oral Q4H PRN Roise Cleaver, NP       ciprofloxacin (CIPRO) tablet 500 mg  500 mg Oral BID Roise Cleaver, NP   500 mg at 05/07/23 0848   LORazepam (ATIVAN) tablet 1 mg  1 mg Oral TID PRN Roise Cleaver, NP   1 mg at 05/07/23 0848   magnesium hydroxide (MILK OF MAGNESIA) suspension 30 mL  30 mL Oral Daily PRN Roise Cleaver, NP       OLANZapine (ZYPREXA) injection 5 mg  5 mg Intramuscular TID PRN Roise Cleaver, NP       OLANZapine (ZYPREXA) tablet 2.5 mg  2.5 mg Oral Q8H PRN Roise Cleaver, NP       OLANZapine (ZYPREXA) tablet 7.5 mg  7.5 mg Oral QHS Jedaiah Rathbun, MD       ondansetron (ZOFRAN-ODT) disintegrating tablet 4 mg  4 mg Oral Q8H PRN Roise Cleaver, NP   4 mg at 05/07/23 1610    Lab Results: No results found for this or any previous visit (from the past 48 hours).  Blood Alcohol level:  Lab Results  Component Value Date   ETH <10 05/04/2023   ETH <11 01/13/2014    Metabolic Disorder Labs: No results found for: "HGBA1C", "MPG" No results found for: "PROLACTIN" No results found for: "CHOL", "TRIG", "HDL", "CHOLHDL", "VLDL", "LDLCALC"  Physical Findings: AIMS:  , ,  ,  ,    CIWA:    COWS:      Psychiatric Specialty Exam:  Presentation  General Appearance:  Casual  Eye Contact: Minimal  Speech: Normal Rate  Speech Volume: Decreased    Mood and Affect  Mood: Anxious  Affect: Depressed   Thought Process  Thought Processes: Disorganized  Descriptions of Associations:Loose  Orientation:Partial  Thought Content:Illogical; Paranoid  Ideation  Hallucinations:Hallucinations: None  Ideas of Reference:Paranoia; Delusions  Suicidal Thoughts:Suicidal Thoughts: Yes, Passive SI Passive Intent and/or Plan: Without Intent; Without Plan  Homicidal Thoughts:Homicidal Thoughts: No   Sensorium  Memory: Immediate Fair; Recent Poor; Remote Poor  Judgment: Impaired  Insight: Shallow   Executive Functions  Concentration: Poor  Attention Span: Fair  Recall: Poor  Fund of Knowledge: Fair  Language: Fair   Psychomotor Activity  Psychomotor Activity: Psychomotor Activity: Normal  Musculoskeletal: Strength & Muscle Tone:  decreased Gait & Station: unsteady Assets  Assets: Manufacturing systems engineer; Desire for Improvement; Resilience; Social Support    Physical Exam: Physical Exam Vitals and nursing note reviewed.  HENT:     Head: Normocephalic.     Right Ear: Tympanic membrane normal.  Cardiovascular:     Rate and Rhythm: Normal rate.     Pulses: Normal pulses.  Skin:    General: Skin is warm.  Neurological:     Mental Status: She is alert.    Review of Systems  Constitutional:  Positive for malaise/fatigue.  HENT: Negative.    Eyes: Negative.   Cardiovascular: Negative.   Skin: Negative.    Blood pressure 105/75, pulse 74, temperature (!) 97 F (36.1 C), resp. rate 18, height 5\' 7"  (1.702 m), weight 67.4 kg, SpO2 99%. Body mass index is 23.26 kg/m.  Diagnosis: Principal Problem:   Schizophrenia [facility])  Clinical Decision Making: Patient with reported history of schizophrenia, anxiety admitted for suicidal ideation and worsening anxiety with some paranoia.  Patient is unable to identify any triggers.  No history of substance use currently.  Patient has severe anxiety.  She needs inpatient hospitalization for further stabilization   Treatment Plan Summary:   Safety and Monitoring:             -- Voluntary admission to inpatient psychiatric unit for safety, stabilization and treatment              -- Daily contact with patient to assess and evaluate symptoms and progress in treatment             -- Patient's case to be discussed in multi-disciplinary team meeting             -- Observation Level: q15 minute checks             -- Vital signs:  q12 hours             -- Precautions: suicide, elopement, and assault   2. Psychiatric Diagnoses and Treatment:               Will discuss initiation of SSRI after discussing with patient's daughter as per patient's request     -- The risks/benefits/side-effects/alternatives to this medication were discussed in detail with the patient and time was given for questions. The patient consents to medication trial.                -- Metabolic profile and EKG monitoring obtained while on an atypical antipsychotic (BMI: Lipid Panel: HbgA1c: QTc:)              -- Encouraged patient to participate in unit milieu and in scheduled group therapies                            3. Medical Issues Being Addressed:  No urgent medical needs identified UTI patient is currently prescribed antibiotics   4. Discharge Planning:              -- Social work and case management to assist with discharge planning and identification of hospital follow-up needs prior to discharge             -- Estimated LOS: 5-7 days             -- Discharge Concerns: Need to establish a safety plan; Medication compliance and effectiveness             -- Discharge Goals: Return  home with outpatient referrals follow ups   Physician Treatment Plan for Primary Diagnosis: Schizophrenia (HCC) Long Term Goal(s): Improvement in symptoms so as ready for discharge   Short Term Goals: Ability to identify changes in lifestyle to reduce recurrence of condition will improve, Ability to verbalize feelings will improve, Ability to disclose and discuss suicidal ideas, Ability to demonstrate self-control will improve, and Ability to identify and develop effective coping behaviors will improve    Physician Treatment Plan for Secondary Diagnosis: Principal Problem:   Schizophrenia (HCC)   Long Term Goal(s): Improvement in symptoms so as ready for discharge   Short Term Goals: Ability to identify changes in lifestyle to reduce recurrence of condition will improve, Ability to verbalize feelings will improve, Ability to disclose and discuss suicidal ideas, Ability to demonstrate self-control will improve, Ability to identify and develop effective coping behaviors will improve, Ability to maintain clinical measurements within normal limits will improve, and Compliance with prescribed medications will improve    Aurelia Blotter, MD 05/07/2023, 2:21 PM

## 2023-05-07 NOTE — Progress Notes (Signed)
   05/07/23 0600  15 Minute Checks  Location Dayroom  Visual Appearance Calm  Behavior Composed  Sleep (Behavioral Health Patients Only)  Calculate sleep? (Click Yes once per 24 hr at 0600 safety check) Yes  Documented sleep last 24 hours 4

## 2023-05-07 NOTE — Group Note (Signed)
 Date:  05/07/2023 Time:  12:17 PM  Group Topic/Focus:  Building Self Esteem:   The Focus of this group is helping patients become aware of the effects of self-esteem on their lives, the things they and others do that enhance or undermine their self-esteem, seeing the relationship between their level of self-esteem and the choices they make and learning ways to enhance self-esteem.    Participation Level:  Active  Participation Quality:  Appropriate  Affect:  Appropriate  Cognitive:  Appropriate  Insight: Appropriate  Engagement in Group:  Engaged  Modes of Intervention:  Activity  Additional Comments:    Kinsler Soeder 05/07/2023, 12:17 PM

## 2023-05-07 NOTE — Progress Notes (Signed)
   05/07/23 2000  Psych Admission Type (Psych Patients Only)  Admission Status Voluntary  Psychosocial Assessment  Patient Complaints Depression  Eye Contact Fair  Facial Expression Flat  Affect Labile  Speech Soft  Interaction Assertive  Motor Activity Slow  Appearance/Hygiene Unremarkable  Behavior Characteristics Anxious  Mood Labile  Thought Process  Coherency WDL  Content Preoccupation  Delusions Paranoid  Perception Derealization  Hallucination None reported or observed  Judgment Impaired  Confusion Mild  Danger to Self  Current suicidal ideation? Denies  Danger to Others  Danger to Others None reported or observed   Pt is alert and oriented. C/o nausea. Zofran ODT 4mg  po given PRN. Tol well. Visited by daughter states visit went well. No behavior issues noted. Plan of care continued.

## 2023-05-07 NOTE — Plan of Care (Signed)
 D: Pt alert and oriented. Pt reports experiencing anxiety/depression at this time. Pt denies experiencing any pain at this time. Pt denies experiencing any SI/HI, or AVH at this time.   A: Scheduled medications administered to pt, per MD orders. Support and encouragement provided. Frequent verbal contact made. Routine safety checks conducted q15 minutes.   R: No adverse drug reactions noted. Pt verbally contracts for safety at this time. Pt compliant with medications and treatment plan. Pt interacts poorly with others on the unit. Pt remains safe at this time. Plan of care ongoing.  This morning the pt had to be stopped from pulling the motivational quotes from off the wall. The pt tried to keep the component that was sticking the quotes to the wall. When asked why she was pulling them off the wall she stated they were spells and witchcraft. Pt reassured they were not and it was not. Pt also observed walking in her room without assistance or assistive devices. Pt also heard retching but when this writer entered the pt's room the pt was observed blowing her nose and making the sound. No emesis was in the toilet only saliva that was observed being spit into the toilet.   Problem: Education: Goal: Emotional status will improve Outcome: Not Progressing Goal: Mental status will improve Outcome: Not Progressing

## 2023-05-07 NOTE — Plan of Care (Addendum)
 Patient alert and oriented. Denies SI, HI, AVH. Pain 9/10 Tylenol administered; pt's pain decreased 5/10, requested more pain meds; unable to receive other than tylenol per on-call provider d/t elevated CRT. Support and encouragement provided.  Routine safety checks conducted every 15 minutes.  Patient informed to notify staff with problems or concerns. No adverse drug reactions noted. Pt did not sleep well up and down all night. Declined meds needed for anxiety. Patient verbally contracts for safety at this time. Patient interacts well with others on the unit.  Patient remains safe at this time.  Problem: Education: Goal: Knowledge of [place] [facility] Education information/materials will improve Outcome: Progressing Goal: Emotional status will improve Outcome: Progressing Goal: Mental status will improve Outcome: Progressing Goal: Verbalization of understanding the information provided will improve Outcome: Progressing   Problem: Activity: Goal: Interest or engagement in activities will improve Outcome: Progressing Goal: Sleeping patterns will improve Outcome: Not Progressing   Problem: Coping: Goal: Ability to verbalize frustrations and anger appropriately will improve Outcome: Progressing Goal: Ability to demonstrate self-control will improve Outcome: Progressing   Problem: Health Behavior/Discharge Planning: Goal: Identification of resources available to assist in meeting health care needs will improve Outcome: Progressing Goal: Compliance with treatment plan for underlying cause of condition will improve Outcome: Progressing   Problem: Safety: Goal: Periods of time without injury will increase Outcome: Progressing

## 2023-05-08 DIAGNOSIS — F209 Schizophrenia, unspecified: Secondary | ICD-10-CM | POA: Diagnosis not present

## 2023-05-08 NOTE — Plan of Care (Signed)
   Problem: Education: Goal: Knowledge of [facility] Education information/materials will improve Outcome: Progressing Goal: Emotional status will improve Outcome: Progressing Goal: Mental status will improve Outcome: Progressing Goal: Verbalization of understanding the information provided will improve Outcome: Progressing   Problem: Activity: Goal: Interest or engagement in activities will improve Outcome: Progressing Goal: Sleeping patterns will improve Outcome: Progressing   Problem: Coping: Goal: Ability to verbalize frustrations and anger appropriately will improve Outcome: Progressing Goal: Ability to demonstrate self-control will improve Outcome: Progressing

## 2023-05-08 NOTE — Plan of Care (Signed)
 D: Pt alert and oriented. Pt reports experiencing anxiety/depression at this time. Pt reports experiencing 10/10 generalized body pain, prn medication given. Pt denies experiencing any SI/HI, or AVH at this time.   A: Scheduled medications administered to pt, per MD orders. Support and encouragement provided. Frequent verbal contact made. Routine safety checks conducted q15 minutes.   R: No adverse drug reactions noted. Pt verbally contracts for safety at this time. Pt compliant with medications and treatment plan. Pt interacts well with others on the unit. Pt remains safe at this time. Plan of care ongoing.  Problem: Education: Goal: Emotional status will improve Outcome: Not Progressing Goal: Mental status will improve Outcome: Not Progressing

## 2023-05-08 NOTE — [facility] Group Notes (Signed)
 LCSW Wellness Group Note   05/08/2023 1:00pm  Type of Group and Topic: Psychoeducational Group:  Wellness  Participation Level:  did not attend  Description of Group  Wellness group introduces the topic and its focus on developing healthy habits across the spectrum and its relationship to a decrease in hospital admissions.  Six areas of wellness are discussed: physical, social spiritual, intellectual, occupational, and emotional.  Patients are asked to consider their current wellness habits and to identify areas of wellness where they are interested and able to focus on improvements.    Therapeutic Goals Patients will understand components of wellness and how they can positively impact overall health.  Patients will identify areas of wellness where they have developed good habits. Patients will identify areas of wellness where they would like to make improvements.    Summary of Patient Progress     Therapeutic Modalities: Cognitive Behavioral Therapy Psychoeducation    Elspeth Hals, LCSW

## 2023-05-08 NOTE — Group Note (Signed)
 Date:  05/08/2023 Time:  10:50 AM  Group Topic/Focus:  Meditation Therapy    Participation Level:  Did Not Attend    Vicki Thomas 05/08/2023, 10:50 AM

## 2023-05-08 NOTE — Group Note (Signed)
 Date:  05/08/2023 Time:  10:39 PM  Group Topic/Focus:  Wrap-Up Group:   The focus of this group is to help patients review their daily goal of treatment and discuss progress on daily workbooks.    Participation Level:  Did Not Attend  Participation Quality:    Affect:      Cognitive:      Insight: None  Engagement in Group:  None  Modes of Intervention:      Additional Comments:    [name] 05/08/2023, 10:39 PM

## 2023-05-08 NOTE — Progress Notes (Signed)
   05/08/23 0615  15 Minute Checks  Location Bedroom  Visual Appearance Calm  Behavior Composed  Sleep (Behavioral Health Patients Only)  Calculate sleep? (Click Yes once per 24 hr at 0600 safety check) Yes  Documented sleep last 24 hours 6.25

## 2023-05-08 NOTE — Progress Notes (Signed)
 [facility] MD Progress Note  05/08/2023 5:21 PM Vicki Thomas  MRN:  [id number]  Vicki Thomas is a 63 y.o. female admitted: Presented to the EDfor 05/04/2023  5:23 PM for hallucinations, suicidal ideations. She carries the psychiatric diagnoses of schizophrenia, bipolar and has a past medical history of  UTI. Her current presentation of suicidal ideations, hallucinations, paranoia is most consistent with schizophrenia.Patient is admitted to [facility] unit with Q15 min safety monitoring. Multidisciplinary team approach is offered. Medication management; group/milieu therapy is offered.  Subjective:  Chart reviewed, case discussed in multidisciplinary meeting, patient seen during rounds.  Patient is noted to be resting in her bed.  She responded to verbal commands.  She reports feeling tired.  Per nursing patient is visible to be independently mobile in her room but when she comes out she uses wheelchair.  Per nursing patient is seen to be retching but there is no vomit in the sink.  Today patient did endorse having stomach upset.  Provider encouraged her to have some Gatorade.  Patient continues to endorse passive SI and hearing voices which are vague.  Provider discussed extensively about her detailed history of allergies listed in the chart for 24 psychotropic medications that includes most of the antipsychotics and mood stabilizers.  Provider explained that the long-acting injectable medications that she is requesting are listed as allergies.  Provider educated her about ED starting her on Zyprexa and the dosage will be titrated up on the unit.  Patient continues to request for LAI and spite of being educated that medications that come in LAI form like Abilify, Risperdal, Invega listed as an allergies.  Unable to reach out to the daughter yesterday, will continue to call her.  Left message with the staff to reach out to the provider when the daughter visits the unit.  Sleep: Fair  Appetite:  Fair  Past  Psychiatric History: see h&P Family History:  Family History  Problem Relation Age of Onset   Alcohol abuse Mother    Bipolar disorder Father    Paranoid behavior Father    Alcohol abuse Father    Bipolar disorder Sister    Anxiety disorder Sister    Alcohol abuse Brother    Drug abuse Brother    Bipolar disorder Paternal Aunt    Schizophrenia Maternal Grandfather    Anxiety disorder Maternal Aunt    Social History:  Social History   Substance and Sexual Activity  Alcohol Use No     Social History   Substance and Sexual Activity  Drug Use No    Social History   Socioeconomic History   Marital status: Single    Spouse name: Not on file   Number of children: Not on file   Years of education: Not on file   Highest education level: Not on file  Occupational History   Not on file  Tobacco Use   Smoking status: Former    Current packs/day: 1.00    Average packs/day: 1 pack/day for 6.0 years (6.0 ttl pk-yrs)    Types: Cigarettes   Smokeless tobacco: Never  Vaping Use   Vaping status: Never Used  Substance and Sexual Activity   Alcohol use: No   Drug use: No   Sexual activity: Not Currently  Other Topics Concern   Not on file  Social History Narrative   Not on file   Social Drivers of Health   Financial Resource Strain: Not on file  Food Insecurity: Patient Declined (05/05/2023)   Hunger Vital  Sign    Worried About Programme researcher, broadcasting/film/video in the Last Year: Patient declined    Ran Out of Food in the Last Year: Patient declined  Transportation Needs: No Transportation Needs (05/05/2023)   PRAPARE - Administrator, Civil Service (Medical): No    Lack of Transportation (Non-Medical): No  Physical Activity: Not on file  Stress: Not on file  Social Connections: Unknown (05/30/2021)   Received from [facility]   Social Network    Social Network: Not on file   Past Medical History:  Past Medical History:  Diagnosis Date   Anxiety    Arthritis     Bipolar 1 disorder (HCC)    Chronic back pain    Chronic fatigue    Chronic headaches    Chronic pain    Fibromyalgia    Hiatal hernia    History of borderline personality disorder    Interstitial cystitis    Lymphedema    Panic disorder with agoraphobia    PTSD (post-traumatic stress disorder)     Past Surgical History:  Procedure Laterality Date   ABDOMINAL HYSTERECTOMY     APPENDECTOMY     BARIATRIC SURGERY     CHOLECYSTECTOMY     ESOPHAGUS SURGERY     FOOT SURGERY     HEMORRHOID SURGERY     HERNIA REPAIR     TONSILLECTOMY      Current Medications: Current Facility-Administered Medications  Medication Dose Route Frequency Provider Last Rate Last Admin   acetaminophen (TYLENOL) tablet 650 mg  650 mg Oral Q4H PRN Roise Cleaver, NP   650 mg at 05/08/23 1713   alum & mag hydroxide-simeth (MAALOX/MYLANTA) 200-200-20 MG/5ML suspension 30 mL  30 mL Oral Q4H PRN Roise Cleaver, NP       ciprofloxacin (CIPRO) tablet 500 mg  500 mg Oral BID Roise Cleaver, NP   500 mg at 05/08/23 0842   LORazepam (ATIVAN) tablet 1 mg  1 mg Oral TID PRN Roise Cleaver, NP   1 mg at 05/08/23 1149   magnesium hydroxide (MILK OF MAGNESIA) suspension 30 mL  30 mL Oral Daily PRN Roise Cleaver, NP       OLANZapine (ZYPREXA) injection 5 mg  5 mg Intramuscular TID PRN Roise Cleaver, NP       OLANZapine (ZYPREXA) tablet 2.5 mg  2.5 mg Oral Q8H PRN Roise Cleaver, NP   2.5 mg at 05/08/23 0841   OLANZapine (ZYPREXA) tablet 7.5 mg  7.5 mg Oral QHS Altus Zaino, MD   7.5 mg at 05/07/23 2117   ondansetron (ZOFRAN-ODT) disintegrating tablet 4 mg  4 mg Oral Q8H PRN Roise Cleaver, NP   4 mg at 05/08/23 1713    Lab Results: No results found for this or any previous visit (from the past 48 hours).  Blood Alcohol level:  Lab Results  Component Value Date   ETH <10 05/04/2023   ETH <11 01/13/2014    Metabolic Disorder Labs: No results found for: "HGBA1C", "MPG" No results found for:  "PROLACTIN" No results found for: "CHOL", "TRIG", "HDL", "CHOLHDL", "VLDL", "LDLCALC"  Physical Findings: AIMS:  , ,  ,  ,    CIWA:    COWS:      Psychiatric Specialty Exam:  Presentation  General Appearance:  Appropriate for Environment  Eye Contact: Minimal  Speech: Normal Rate  Speech Volume: Decreased    Mood and Affect  Mood: Anxious; Depressed  Affect: Flat   Thought Process  Thought Processes:  Disorganized  Descriptions of Associations:Loose  Orientation:Partial  Thought Content:Scattered; Illogical  Hallucinations:Hallucinations: Auditory  Ideas of Reference:Paranoia  Suicidal Thoughts:Suicidal Thoughts: Yes, Passive SI Passive Intent and/or Plan: Without Intent; Without Plan  Homicidal Thoughts:Homicidal Thoughts: No   Sensorium  Memory: Immediate Fair; Recent Poor; Remote Poor  Judgment: Impaired  Insight: Shallow   Executive Functions  Concentration: Poor  Attention Span: Poor  Recall: Poor  Fund of Knowledge: Poor  Language: Poor   Psychomotor Activity  Psychomotor Activity: Psychomotor Activity: Restlessness  Musculoskeletal: Strength & Muscle Tone: decreased Gait & Station: unsteady Assets  Assets: Manufacturing systems engineer; Desire for Improvement; Social Support    Physical Exam: Physical Exam Vitals and nursing note reviewed.  HENT:     Head: Normocephalic.     Right Ear: Tympanic membrane normal.  Cardiovascular:     Rate and Rhythm: Normal rate.     Pulses: Normal pulses.  Skin:    General: Skin is warm.  Neurological:     Mental Status: She is alert.    Review of Systems  Constitutional:  Positive for malaise/fatigue.  HENT: Negative.    Eyes: Negative.   Cardiovascular: Negative.   Skin: Negative.    Blood pressure 116/80, pulse 76, temperature 97.7 F (36.5 C), resp. rate 15, height 5\' 7"  (1.702 m), weight 67.4 kg, SpO2 96%. Body mass index is 23.26 kg/m.  Diagnosis: Principal  Problem:   Schizophrenia [facility])  Clinical Decision Making: Patient with reported history of schizophrenia, anxiety admitted for suicidal ideation and worsening anxiety with some paranoia.  Patient is unable to identify any triggers.  No history of substance use currently.  Patient has severe anxiety.  She needs inpatient hospitalization for further stabilization   Treatment Plan Summary:   Safety and Monitoring:             -- Voluntary admission to inpatient psychiatric unit for safety, stabilization and treatment             -- Daily contact with patient to assess and evaluate symptoms and progress in treatment             -- Patient's case to be discussed in multi-disciplinary team meeting             -- Observation Level: q15 minute checks             -- Vital signs:  q12 hours             -- Precautions: suicide, elopement, and assault   2. Psychiatric Diagnoses and Treatment:               Ed started patient on Zyprexa 5 mg at bedtime which was titarte dto 7.5 mg QHS     -- The risks/benefits/side-effects/alternatives to this medication were discussed in detail with the patient and time was given for questions. The patient consents to medication trial.                -- Metabolic profile and EKG monitoring obtained while on an atypical antipsychotic (BMI: Lipid Panel: HbgA1c: QTc:)              -- Encouraged patient to participate in unit milieu and in scheduled group therapies                            3. Medical Issues Being Addressed:  No urgent medical needs identified UTI patient is currently prescribed antibiotics   4.  Discharge Planning:              -- Social work and case management to assist with discharge planning and identification of hospital follow-up needs prior to discharge             -- Estimated LOS: 5-7 days             -- Discharge Concerns: Need to establish a safety plan; Medication compliance and effectiveness             -- Discharge Goals: Return home  with outpatient referrals follow ups   Physician Treatment Plan for Primary Diagnosis: Schizophrenia (HCC) Long Term Goal(s): Improvement in symptoms so as ready for discharge   Short Term Goals: Ability to identify changes in lifestyle to reduce recurrence of condition will improve, Ability to verbalize feelings will improve, Ability to disclose and discuss suicidal ideas, Ability to demonstrate self-control will improve, and Ability to identify and develop effective coping behaviors will improve   Physician Treatment Plan for Secondary Diagnosis: Principal Problem:   Schizophrenia (HCC)   Long Term Goal(s): Improvement in symptoms so as ready for discharge   Short Term Goals: Ability to identify changes in lifestyle to reduce recurrence of condition will improve, Ability to verbalize feelings will improve, Ability to disclose and discuss suicidal ideas, Ability to demonstrate self-control will improve, Ability to identify and develop effective coping behaviors will improve, Ability to maintain clinical measurements within normal limits will improve, and Compliance with prescribed medications will improve    Aurelia Blotter, MD 05/08/2023, 5:21 PM

## 2023-05-09 DIAGNOSIS — F209 Schizophrenia, unspecified: Secondary | ICD-10-CM | POA: Diagnosis not present

## 2023-05-09 MED ORDER — CLONAZEPAM 0.5 MG PO TABS
0.5000 mg | ORAL_TABLET | Freq: Two times a day (BID) | ORAL | Status: DC
  Administered 2023-05-09 – 2023-05-17 (×17): 0.5 mg via ORAL
  Filled 2023-05-09 (×17): qty 1

## 2023-05-09 MED ORDER — LORAZEPAM 0.5 MG PO TABS: 0.5000 mg | ORAL_TABLET | Freq: Two times a day (BID) | ORAL | Status: DC | PRN

## 2023-05-09 MED ORDER — HYDROXYZINE HCL 50 MG PO TABS
50.0000 mg | ORAL_TABLET | Freq: Four times a day (QID) | ORAL | Status: DC | PRN
  Administered 2023-05-11 – 2023-05-17 (×13): 50 mg via ORAL
  Filled 2023-05-09 (×13): qty 1

## 2023-05-09 NOTE — Progress Notes (Signed)
 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 Hx of Bipolar and personality disorder with non compliance with medications.  Patient is labile and has attention seeking behavior.  Only complaint today was somatic and requesting vaginal cream and tylenol for nonspecific body pain.  Is agoraphobic so did not participate in the group outside activities today.  Gets along well with her peers.  Will continue to monitor.

## 2023-05-09 NOTE — Plan of Care (Signed)

## 2023-05-09 NOTE — Progress Notes (Signed)
   05/08/23 2200  Psych Admission Type (Psych Patients Only)  Admission Status Voluntary  Psychosocial Assessment  Patient Complaints Worrying;Anxiety  Eye Contact Fair  Facial Expression Flat  Affect Labile  Speech Pressured  Interaction Attention-seeking;Needy  Motor Activity Slow  Appearance/Hygiene Unremarkable  Behavior Characteristics Impulsive  Mood Preoccupied  Thought Process  Coherency WDL  Content Blaming self;Preoccupation  Delusions Paranoid  Perception Derealization  Hallucination None reported or observed  Judgment Impaired  Confusion Mild  Danger to Self  Current suicidal ideation? Denies   Pt is alert and oriented. Pt remains irritable, needy and intrusive w/ increase agitation. Spinning around in wheelchair tearing down the motivational quotes off the wall. IM injection given PRN for agitation. Tol well.  ABT remains in progress for UTI.  No adverse reaction noted. Denies SI/HI. Plan of care continued

## 2023-05-09 NOTE — Group Note (Signed)
 Date:  05/09/2023 Time:  10:37 AM  Group Topic/Focus:  Outdoor Recreation Therapy    Participation Level:  Did Not Attend    Merton Abts 05/09/2023, 10:37 AM

## 2023-05-09 NOTE — Progress Notes (Signed)
 [facility] MD Progress Note  05/09/2023 12:14 PM Vicki Thomas  MRN:  [id number]  Vicki Thomas is a 63 y.o. female admitted: Presented to the EDfor 05/04/2023  5:23 PM for hallucinations, suicidal ideations. She carries the psychiatric diagnoses of schizophrenia, bipolar and has a past medical history of  UTI. Her current presentation of suicidal ideations, hallucinations, paranoia is most consistent with schizophrenia.Patient is admitted to [facility] unit with Q15 min safety monitoring. Multidisciplinary team approach is offered. Medication management; group/milieu therapy is offered.  Subjective:  Chart reviewed, case discussed in multidisciplinary meeting, patient seen during rounds.  Patient is noted to be resting in her bed she reports feeling tired.  She talks about hearing some voices/noises but is unclear if it is hallucinations or the voices are coming from outside the room from the staff.  She denies suicidal ideation and homicidal ideation.  She reports having poor appetite.  She continues to endorse anxiety.  Provided discussed about PT OT consult to evaluate her home health needs as she is noted to be using wheelchair on the unit and she lives by herself.  Patient has multiple demands throughout the day and gets upset with the staff if they try to explain the policy of the hospital and what can be done and what cannot be done.  Contacted patient's daughter Vicki Thomas, [phone] who wanted to discuss if patient will be able to get some home health aide.  Provider educated that the referral should go through their PCP and on the unit we can do PT/OT referral and probably can put in a referral depending on the recommendations.  Provider and daughter discussed in detail about patient's frustration about having multiple needs the do not comply with hospital policies.  Sleep: Fair  Appetite:  Fair  Past Psychiatric History: see h&P Family History:  Family History  Problem Relation Age of Onset    Alcohol abuse Mother    Bipolar disorder Father    Paranoid behavior Father    Alcohol abuse Father    Bipolar disorder Sister    Anxiety disorder Sister    Alcohol abuse Brother    Drug abuse Brother    Bipolar disorder Paternal Aunt    Schizophrenia Maternal Grandfather    Anxiety disorder Maternal Aunt    Social History:  Social History   Substance and Sexual Activity  Alcohol Use No     Social History   Substance and Sexual Activity  Drug Use No    Social History   Socioeconomic History   Marital status: Single    Spouse name: Not on file   Number of children: Not on file   Years of education: Not on file   Highest education level: Not on file  Occupational History   Not on file  Tobacco Use   Smoking status: Former    Current packs/day: 1.00    Average packs/day: 1 pack/day for 6.0 years (6.0 ttl pk-yrs)    Types: Cigarettes   Smokeless tobacco: Never  Vaping Use   Vaping status: Never Used  Substance and Sexual Activity   Alcohol use: No   Drug use: No   Sexual activity: Not Currently  Other Topics Concern   Not on file  Social History Narrative   Not on file   Social Drivers of Health   Financial Resource Strain: Not on file  Food Insecurity: Patient Declined (05/05/2023)   Hunger Vital Sign    Worried About Running Out of Food in the Last Year:  Patient declined    Barista in the Last Year: Patient declined  Transportation Needs: No Transportation Needs (05/05/2023)   PRAPARE - Administrator, Civil Service (Medical): No    Lack of Transportation (Non-Medical): No  Physical Activity: Not on file  Stress: Not on file  Social Connections: Unknown (05/30/2021)   Received from [facility]   Social Network    Social Network: Not on file   Past Medical History:  Past Medical History:  Diagnosis Date   Anxiety    Arthritis    Bipolar 1 disorder (HCC)    Chronic back pain    Chronic fatigue    Chronic headaches    Chronic  pain    Fibromyalgia    Hiatal hernia    History of borderline personality disorder    Interstitial cystitis    Lymphedema    Panic disorder with agoraphobia    PTSD (post-traumatic stress disorder)     Past Surgical History:  Procedure Laterality Date   ABDOMINAL HYSTERECTOMY     APPENDECTOMY     BARIATRIC SURGERY     CHOLECYSTECTOMY     ESOPHAGUS SURGERY     FOOT SURGERY     HEMORRHOID SURGERY     HERNIA REPAIR     TONSILLECTOMY      Current Medications: Current Facility-Administered Medications  Medication Dose Route Frequency Provider Last Rate Last Admin   acetaminophen (TYLENOL) tablet 650 mg  650 mg Oral Q4H PRN Eligha Bridegroom, NP   650 mg at 05/09/23 1014   alum & mag hydroxide-simeth (MAALOX/MYLANTA) 200-200-20 MG/5ML suspension 30 mL  30 mL Oral Q4H PRN Eligha Bridegroom, NP       ciprofloxacin (CIPRO) tablet 500 mg  500 mg Oral BID Eligha Bridegroom, NP   500 mg at 05/09/23 1011   clonazePAM (KLONOPIN) tablet 0.5 mg  0.5 mg Oral BID Verner Chol, MD       hydrOXYzine (ATARAX) tablet 50 mg  50 mg Oral Q6H PRN Verner Chol, MD       magnesium hydroxide (MILK OF MAGNESIA) suspension 30 mL  30 mL Oral Daily PRN Eligha Bridegroom, NP       OLANZapine (ZYPREXA) injection 5 mg  5 mg Intramuscular TID PRN Eligha Bridegroom, NP   5 mg at 05/08/23 2224   OLANZapine (ZYPREXA) tablet 2.5 mg  2.5 mg Oral Q8H PRN Eligha Bridegroom, NP   2.5 mg at 05/08/23 0841   OLANZapine (ZYPREXA) tablet 7.5 mg  7.5 mg Oral QHS Verner Chol, MD   7.5 mg at 05/07/23 2117   ondansetron (ZOFRAN-ODT) disintegrating tablet 4 mg  4 mg Oral Q8H PRN Eligha Bridegroom, NP   4 mg at 05/08/23 1713    Lab Results: No results found for this or any previous visit (from the past 48 hours).  Blood Alcohol level:  Lab Results  Component Value Date   ETH <10 05/04/2023   ETH <11 01/13/2014    Metabolic Disorder Labs: No results found for: "HGBA1C", "MPG" No results found for: "PROLACTIN" No results  found for: "CHOL", "TRIG", "HDL", "CHOLHDL", "VLDL", "LDLCALC"  Physical Findings: AIMS:  , ,  ,  ,    CIWA:    COWS:      Psychiatric Specialty Exam:  Presentation  General Appearance:  Casual; Fairly Groomed  Eye Contact: Minimal  Speech: Normal Rate  Speech Volume: Decreased    Mood and Affect  Mood: Anxious  Affect: Constricted   Thought  Process  Thought Processes: Irrevelant  Descriptions of Associations:Loose  Orientation:Partial  Thought Content:Illogical; Paranoid Ideation  Hallucinations:Hallucinations: Auditory Description of Auditory Hallucinations: chronic not command type  Ideas of Reference:Paranoia  Suicidal Thoughts:Suicidal Thoughts: No SI Passive Intent and/or Plan: Without Intent; Without Plan  Homicidal Thoughts:Homicidal Thoughts: No   Sensorium  Memory: Immediate Fair; Recent Fair; Remote Poor  Judgment: Impaired  Insight: Shallow   Executive Functions  Concentration: Fair  Attention Span: Fair  Recall: Fair  Fund of Knowledge: Fair  Language: Fair   Psychomotor Activity  Psychomotor Activity: Psychomotor Activity: Normal  Musculoskeletal: Strength & Muscle Tone: decreased Gait & Station: unsteady Assets  Assets: Manufacturing systems engineer; Desire for Improvement; Resilience; Social Support    Physical Exam: Physical Exam Vitals and nursing note reviewed.  HENT:     Head: Normocephalic.     Right Ear: Tympanic membrane normal.  Cardiovascular:     Rate and Rhythm: Normal rate.     Pulses: Normal pulses.  Skin:    General: Skin is warm.  Neurological:     Mental Status: She is alert.    Review of Systems  Constitutional:  Positive for malaise/fatigue.  HENT: Negative.    Eyes: Negative.   Cardiovascular: Negative.   Skin: Negative.    Blood pressure 119/79, pulse 98, temperature 98.1 F (36.7 C), resp. rate 18, height 5\' 7"  (1.702 m), weight 67.4 kg, SpO2 95%. Body mass index is 23.26  kg/m.  Diagnosis: Principal Problem:   Schizophrenia [facility])  Clinical Decision Making: Patient with reported history of schizophrenia, anxiety admitted for suicidal ideation and worsening anxiety with some paranoia.  Patient is unable to identify any triggers.  No history of substance use currently.  Patient has severe anxiety.  She needs inpatient hospitalization for further stabilization   Treatment Plan Summary:   Safety and Monitoring:             -- Voluntary admission to inpatient psychiatric unit for safety, stabilization and treatment             -- Daily contact with patient to assess and evaluate symptoms and progress in treatment             -- Patient's case to be discussed in multi-disciplinary team meeting             -- Observation Level: q15 minute checks             -- Vital signs:  q12 hours             -- Precautions: suicide, elopement, and assault   2. Psychiatric Diagnoses and Treatment:  Patient is requiring multiple as needed Ativan for anxiety-discontinued Ativan as needed and added Klonopin 0.5 twice daily given severe anxiety displayed by the patient Continue Zyprexa 7.5 nightly Per chart patient has documentation of multiple psychotropics having allergic reaction to.     -- The risks/benefits/side-effects/alternatives to this medication were discussed in detail with the patient and time was given for questions. The patient consents to medication trial.                -- Metabolic profile and EKG monitoring obtained while on an atypical antipsychotic (BMI: Lipid Panel: HbgA1c: QTc:)              -- Encouraged patient to participate in unit milieu and in scheduled group therapies  3. Medical Issues Being Addressed:  No urgent medical needs identified UTI patient is currently prescribed antibiotics   4. Discharge Planning:              -- Social work and case management to assist with discharge planning and identification of  hospital follow-up needs prior to discharge             -- Estimated LOS: 5-7 days             -- Discharge Concerns: Need to establish a safety plan; Medication compliance and effectiveness             -- Discharge Goals: Return home with outpatient referrals follow ups   Physician Treatment Plan for Primary Diagnosis: Schizophrenia (HCC) Long Term Goal(s): Improvement in symptoms so as ready for discharge   Short Term Goals: Ability to identify changes in lifestyle to reduce recurrence of condition will improve, Ability to verbalize feelings will improve, Ability to disclose and discuss suicidal ideas, Ability to demonstrate self-control will improve, and Ability to identify and develop effective coping behaviors will improve   Physician Treatment Plan for Secondary Diagnosis: Principal Problem:   Schizophrenia (HCC)   Long Term Goal(s): Improvement in symptoms so as ready for discharge   Short Term Goals: Ability to identify changes in lifestyle to reduce recurrence of condition will improve, Ability to verbalize feelings will improve, Ability to disclose and discuss suicidal ideas, Ability to demonstrate self-control will improve, Ability to identify and develop effective coping behaviors will improve, Ability to maintain clinical measurements within normal limits will improve, and Compliance with prescribed medications will improve    Aurelia Blotter, MD 05/09/2023, 12:14 PM

## 2023-05-09 NOTE — Group Note (Unsigned)
 Date:  05/09/2023 Time:  8:20 PM  Group Topic/Focus:  Orientation:   The focus of this group is to educate the patient on the purpose and policies of crisis stabilization and provide a format to answer questions about their admission.  The group details unit policies and expectations of patients while admitted.     Participation Level:  {BHH PARTICIPATION OZHYQ:65784}  Participation Quality:  {BHH PARTICIPATION QUALITY:22265}  Affect:  {BHH AFFECT:22266}  Cognitive:  {BHH COGNITIVE:22267}  Insight: {BHH Insight2:20797}  Engagement in Group:  {BHH ENGAGEMENT IN ONGEX:52841}  Modes of Intervention:  {BHH MODES OF INTERVENTION:22269}  Additional Comments:  ***  Terryn Rosenkranz 05/09/2023, 8:20 PM

## 2023-05-09 NOTE — Group Note (Signed)
 Date:  05/10/2023 Time:  12:01 AM  Group Topic/Focus:  Wrap-Up Group:   The focus of this group is to help patients review their daily goal of treatment and discuss progress on daily workbooks.    Participation Level:  Minimal  Participation Quality:  Inattentive  Affect:  Anxious  Cognitive:  Oriented  Insight: Limited  Engagement in Group:  Limited  Modes of Intervention:  Discussion  Additional Comments:    Vicki Thomas 05/10/2023, 12:01 AM

## 2023-05-09 NOTE — Group Note (Signed)
 Recreation Therapy Group Note   Group Topic:General Recreation  Group Date: 05/09/2023 Start Time: 1410 End Time: 1510 Facilitators: Deatrice Factor, LRT, CTRS Location: Courtyard  Group Description: Outdoor Recreation. Patients had the option to play corn hole, ring toss, bowling or listening to music while outside in the courtyard getting fresh air and sunlight. LRT and patients discussed things that they enjoy doing in their free time outside of the hospital. LRT encouraged patients to drink water after being active and getting their heart rate up.   Goal Area(s) Addressed: Patient will identify leisure interests.  Patient will practice healthy decision making. Patient will engage in recreation activity   Affect/Mood: Flat   Participation Level: Non-verbal    Clinical Observations/Individualized Feedback: Vicki Thomas was outside for 10 minutes before going back inside. Pt did not interact with LRT or peers while present.   Plan: Continue to engage patient in RT group sessions 2-3x/week.   Deatrice Factor, LRT, CTRS 05/09/2023 5:03 PM

## 2023-05-10 DIAGNOSIS — F200 Paranoid schizophrenia: Secondary | ICD-10-CM

## 2023-05-10 NOTE — Progress Notes (Signed)
 [facility] MD Progress Note  05/10/2023 4:33 PM Vicki Thomas  MRN:  [id number]  Vicki Thomas is a 63 y.o. female admitted: Presented to the EDfor 05/04/2023  5:23 PM for hallucinations, suicidal ideations. She carries the psychiatric diagnoses of schizophrenia, bipolar and has a past medical history of  UTI. Her current presentation of suicidal ideations, hallucinations, paranoia is most consistent with schizophrenia.Patient is admitted to [facility] unit with Q15 min safety monitoring. Multidisciplinary team approach is offered. Medication management; group/milieu therapy is offered.  Subjective:  Chart reviewed, case discussed in multidisciplinary meeting, patient seen during rounds.  On interview patient is noted to be playing cards with peers in the day area.  Physical therapy has evaluated patient on the unit and has not recommended any home health PT.  Patient reports that she felt a lot better las patient was educated about removing Ativan as a as needed and adding scheduled Klonopin 0.5 twice daily to help with the anxiety as patient has been requiring multiple doses of as needed Ativan throughout the day.  t evening but today morning she started hearing some voices saying silly things talking about magical walled.  Per nursing staff patient is able to walk just fine when she is by herself in the room and is communicating with the peers with no problems, not responding to internal stimuli and the staff is noticing her in the day room.  She denies any suicidal thoughts today, denies HI/intent/plan.  patient's daughter [name] [phone]  Sleep: Fair  Appetite:  Fair  Past Psychiatric History: see h&P Family History:  Family History  Problem Relation Age of Onset   Alcohol abuse Mother    Bipolar disorder Father    Paranoid behavior Father    Alcohol abuse Father    Bipolar disorder Sister    Anxiety disorder Sister    Alcohol abuse Brother    Drug abuse Brother    Bipolar disorder  Paternal Aunt    Schizophrenia Maternal Grandfather    Anxiety disorder Maternal Aunt    Social History:  Social History   Substance and Sexual Activity  Alcohol Use No     Social History   Substance and Sexual Activity  Drug Use No    Social History   Socioeconomic History   Marital status: Single    Spouse name: Not on file   Number of children: Not on file   Years of education: Not on file   Highest education level: Not on file  Occupational History   Not on file  Tobacco Use   Smoking status: Former    Current packs/day: 1.00    Average packs/day: 1 pack/day for 6.0 years (6.0 ttl pk-yrs)    Types: Cigarettes   Smokeless tobacco: Never  Vaping Use   Vaping status: Never Used  Substance and Sexual Activity   Alcohol use: No   Drug use: No   Sexual activity: Not Currently  Other Topics Concern   Not on file  Social History Narrative   Not on file   Social Drivers of Health   Financial Resource Strain: Not on file  Food Insecurity: Patient Declined (05/05/2023)   Hunger Vital Sign    Worried About Running Out of Food in the Last Year: Patient declined    Ran Out of Food in the Last Year: Patient declined  Transportation Needs: No Transportation Needs (05/05/2023)   PRAPARE - Administrator, Civil Service (Medical): No    Lack of Transportation (Non-Medical):  No  Physical Activity: Not on file  Stress: Not on file  Social Connections: Unknown (05/30/2021)   Received from [facility]   Social Network    Social Network: Not on file   Past Medical History:  Past Medical History:  Diagnosis Date   Anxiety    Arthritis    Bipolar 1 disorder (HCC)    Chronic back pain    Chronic fatigue    Chronic headaches    Chronic pain    Fibromyalgia    Hiatal hernia    History of borderline personality disorder    Interstitial cystitis    Lymphedema    Panic disorder with agoraphobia    PTSD (post-traumatic stress disorder)     Past Surgical  History:  Procedure Laterality Date   ABDOMINAL HYSTERECTOMY     APPENDECTOMY     BARIATRIC SURGERY     CHOLECYSTECTOMY     ESOPHAGUS SURGERY     FOOT SURGERY     HEMORRHOID SURGERY     HERNIA REPAIR     TONSILLECTOMY      Current Medications: Current Facility-Administered Medications  Medication Dose Route Frequency Provider Last Rate Last Admin   acetaminophen (TYLENOL) tablet 650 mg  650 mg Oral Q4H PRN Roise Cleaver, NP   650 mg at 05/10/23 1440   alum & mag hydroxide-simeth (MAALOX/MYLANTA) 200-200-20 MG/5ML suspension 30 mL  30 mL Oral Q4H PRN Roise Cleaver, NP       ciprofloxacin (CIPRO) tablet 500 mg  500 mg Oral BID Roise Cleaver, NP   500 mg at 05/10/23 0905   clonazePAM (KLONOPIN) tablet 0.5 mg  0.5 mg Oral BID Chara Marquard, MD   0.5 mg at 05/10/23 0905   hydrOXYzine (ATARAX) tablet 50 mg  50 mg Oral Q6H PRN Aleen Marston, MD       magnesium hydroxide (MILK OF MAGNESIA) suspension 30 mL  30 mL Oral Daily PRN Roise Cleaver, NP       OLANZapine (ZYPREXA) injection 5 mg  5 mg Intramuscular TID PRN Roise Cleaver, NP   5 mg at 05/08/23 2224   OLANZapine (ZYPREXA) tablet 2.5 mg  2.5 mg Oral Q8H PRN Roise Cleaver, NP   2.5 mg at 05/08/23 0841   OLANZapine (ZYPREXA) tablet 7.5 mg  7.5 mg Oral QHS Zaya Kessenich, MD   7.5 mg at 05/09/23 2127   ondansetron (ZOFRAN-ODT) disintegrating tablet 4 mg  4 mg Oral Q8H PRN Roise Cleaver, NP   4 mg at 05/09/23 2237    Lab Results: No results found for this or any previous visit (from the past 48 hours).  Blood Alcohol level:  Lab Results  Component Value Date   ETH <10 05/04/2023   ETH <11 01/13/2014    Metabolic Disorder Labs: No results found for: "HGBA1C", "MPG" No results found for: "PROLACTIN" No results found for: "CHOL", "TRIG", "HDL", "CHOLHDL", "VLDL", "LDLCALC"  Physical Findings: AIMS:  , ,  ,  ,    CIWA:    COWS:      Psychiatric Specialty Exam:  Presentation  General Appearance:   Appropriate for Environment; Casual  Eye Contact: Fair  Speech: Clear and Coherent  Speech Volume: Normal    Mood and Affect  Mood: Anxious  Affect: Appropriate   Thought Process  Thought Processes: Irrevelant  Descriptions of Associations:Intact  Orientation:Partial  Thought Content:Illogical  Hallucinations:Hallucinations: Auditory (chronic ,not command type) Description of Auditory Hallucinations: chronic not command type  Ideas of Reference:None  Suicidal Thoughts:Suicidal Thoughts:  No  Homicidal Thoughts:Homicidal Thoughts: No   Sensorium  Memory: Immediate Fair; Recent Fair; Remote Poor  Judgment: Impaired  Insight: Shallow   Executive Functions  Concentration: Fair  Attention Span: Fair  Recall: Fair  Fund of Knowledge: Fair  Language: Fair   Psychomotor Activity  Psychomotor Activity: Psychomotor Activity: Normal  Musculoskeletal: Strength & Muscle Tone: decreased Gait & Station: unsteady Assets  Assets: Manufacturing systems engineer; Desire for Improvement; Social Support    Physical Exam: Physical Exam Vitals and nursing note reviewed.  HENT:     Head: Normocephalic.     Right Ear: Tympanic membrane normal.  Cardiovascular:     Rate and Rhythm: Normal rate.     Pulses: Normal pulses.  Skin:    General: Skin is warm.  Neurological:     Mental Status: She is alert.    Review of Systems  Constitutional:  Positive for malaise/fatigue.  HENT: Negative.    Eyes: Negative.   Cardiovascular: Negative.   Skin: Negative.    Blood pressure 112/62, pulse 75, temperature 98.1 F (36.7 C), resp. rate 18, height 5\' 7"  (1.702 m), weight 67.4 kg, SpO2 97%. Body mass index is 23.26 kg/m.  Diagnosis: Principal Problem:   Schizophrenia [facility])  Clinical Decision Making: Patient with reported history of schizophrenia, anxiety admitted for suicidal ideation and worsening anxiety with some paranoia.  Patient is unable to  identify any triggers.  No history of substance use currently.  Patient has severe anxiety.  She needs inpatient hospitalization for further stabilization   Treatment Plan Summary:   Safety and Monitoring:             -- Voluntary admission to inpatient psychiatric unit for safety, stabilization and treatment             -- Daily contact with patient to assess and evaluate symptoms and progress in treatment             -- Patient's case to be discussed in multi-disciplinary team meeting             -- Observation Level: q15 minute checks             -- Vital signs:  q12 hours             -- Precautions: suicide, elopement, and assault   2. Psychiatric Diagnoses and Treatment:  Patient is requiring multiple as needed Ativan for anxiety-discontinued Ativan as needed and added Klonopin 0.5 twice daily given severe anxiety displayed by the patient Continue Zyprexa 7.5 nightly Per chart patient has documentation of multiple psychotropics having allergic reaction to.     -- The risks/benefits/side-effects/alternatives to this medication were discussed in detail with the patient and time was given for questions. The patient consents to medication trial.                -- Metabolic profile and EKG monitoring obtained while on an atypical antipsychotic (BMI: Lipid Panel: HbgA1c: QTc:)              -- Encouraged patient to participate in unit milieu and in scheduled group therapies                            3. Medical Issues Being Addressed:  No urgent medical needs identified UTI patient is currently prescribed antibiotics   4. Discharge Planning:              -- Social work and case  management to assist with discharge planning and identification of hospital follow-up needs prior to discharge             -- Estimated LOS: 5-7 days             -- Discharge Concerns: Need to establish a safety plan; Medication compliance and effectiveness             -- Discharge Goals: Return home with  outpatient referrals follow ups   Physician Treatment Plan for Primary Diagnosis: Schizophrenia (HCC) Long Term Goal(s): Improvement in symptoms so as ready for discharge   Short Term Goals: Ability to identify changes in lifestyle to reduce recurrence of condition will improve, Ability to verbalize feelings will improve, Ability to disclose and discuss suicidal ideas, Ability to demonstrate self-control will improve, and Ability to identify and develop effective coping behaviors will improve   Physician Treatment Plan for Secondary Diagnosis: Principal Problem:   Schizophrenia (HCC)   Long Term Goal(s): Improvement in symptoms so as ready for discharge   Short Term Goals: Ability to identify changes in lifestyle to reduce recurrence of condition will improve, Ability to verbalize feelings will improve, Ability to disclose and discuss suicidal ideas, Ability to demonstrate self-control will improve, Ability to identify and develop effective coping behaviors will improve, Ability to maintain clinical measurements within normal limits will improve, and Compliance with prescribed medications will improve    Aurelia Blotter, MD 05/10/2023, 4:33 PM

## 2023-05-10 NOTE — Evaluation (Signed)
 Physical Therapy Evaluation Patient Details Name: Vicki Thomas MRN: [id number] DOB: 11-01-1960 Today's Date: 05/10/2023  History of Present Illness  Pt is a 63 yo female that presented to ED for hallucinations, SI, anxiety. PMH of bipolar, schizophrenia, chronic back pain, panic disorder with agoraphobia, PTSD, personality disorder, anxiety, fibromyalgia, recent UTI diagnosis.  Clinical Impression  Patient alert agreeable to PT, oriented to self, place, month/year. Per pt at baseline she lives alone, utilizes her rollator for ambulation but sits and "kicks around on it" like a WC. Performs her own ADLs and cooking (simple meals). Pt referenced back and pelvis pain as her main limiter, and why she has been using a WC more than ambulating this admission. PT/pt discussed importance of continued mobility, "use it or lose it" and pt agreeable to attempt to walk more. Able to sit <> Stand with RW and supervision (did not lock WC prior to standing), and ambulate ~32ft with RW and supervision. Left with OT in bathroom at end of session. Pt appears to be nearing baseline mobility, but would benefit from further skilled PT intervention to maximize independence and provide further education. RN notified of pt request to use [place] to help manage her pain (would need to have her daughter bring it from home if able.)          If plan is discharge home, recommend the following: Assist for transportation;Help with stairs or ramp for entrance;Assistance with cooking/housework   Can travel by private vehicle        Equipment Recommendations None recommended by PT  Recommendations for Other Services       Functional Status Assessment Patient has had a recent decline in their functional status and demonstrates the ability to make significant improvements in function in a reasonable and predictable amount of time.     Precautions / Restrictions Precautions Precautions: Fall Recall of  Precautions/Restrictions: Intact Restrictions Weight Bearing Restrictions Per Provider Order: No      Mobility  Bed Mobility Overal bed mobility: Needs Assistance             General bed mobility comments: Pt in WC at start of session, with OT in bathroom at end of session    Transfers Overall transfer level: Needs assistance Equipment used: Rolling walker (2 wheels) Transfers: Sit to/from Stand Sit to Stand: Supervision                Ambulation/Gait Ambulation/Gait assistance: Supervision Gait Distance (Feet): 20 Feet Assistive device: Rolling walker (2 wheels)   Gait velocity: decreased     General Gait Details: pt endorsed some nausea, but no physical assistance needed to ambulate ~45ft  Stairs            Wheelchair Mobility     Tilt Bed    Modified Rankin (Stroke Patients Only)       Balance Overall balance assessment: Needs assistance Sitting-balance support: Feet supported Sitting balance-Leahy Scale: Good     Standing balance support: Bilateral upper extremity supported Standing balance-Leahy Scale: Fair                               Pertinent Vitals/Pain Pain Assessment Pain Assessment: Faces Faces Pain Scale: Hurts little more Pain Location: back and pelvis when asked about pain Pain Descriptors / Indicators: Aching, Sore, Stabbing Pain Intervention(s): Limited activity within patient's tolerance, Monitored during session, Repositioned    Home Living Family/patient expects to be discharged to:: Private  residence Living Arrangements: Alone Available Help at Discharge: Family;Available PRN/intermittently Type of Home: Apartment Home Access: Level entry       Home Layout: One level Home Equipment: Rollator (4 wheels)      Prior Function               Mobility Comments: pt stated she will use her rollator in her home, often sits and "kicks around on it" ADLs Comments: reported modI for ADLs, cooks very  minimally, often microwaves meals     Extremity/Trunk Assessment   Upper Extremity Assessment Upper Extremity Assessment: Defer to OT evaluation    Lower Extremity Assessment Lower Extremity Assessment:  (able to lift and move against gravity without assistance)       Communication        Cognition Arousal: Alert Behavior During Therapy: WFL for tasks assessed/performed   PT - Cognitive impairments: No apparent impairments                       PT - Cognition Comments: oriented to self, place, date Following commands: Intact       Cueing       General Comments      Exercises     Assessment/Plan    PT Assessment Patient needs continued PT services  PT Problem List Decreased strength;Decreased balance;Decreased mobility;Decreased activity tolerance       PT Treatment Interventions DME instruction;Neuromuscular re-education;Gait training;Stair training;Patient/family education;Functional mobility training;Therapeutic activities;Therapeutic exercise;Balance training    PT Goals (Current goals can be found in the Care Plan section)  Acute Rehab PT Goals Patient Stated Goal: to have less pain PT Goal Formulation: With patient Time For Goal Achievement: 05/24/23 Potential to Achieve Goals: Good    Frequency Min 1X/week     Co-evaluation               AM-PAC PT "6 Clicks" Mobility  Outcome Measure Help needed turning from your back to your side while in a flat bed without using bedrails?: None Help needed moving from lying on your back to sitting on the side of a flat bed without using bedrails?: None Help needed moving to and from a bed to a chair (including a wheelchair)?: None Help needed standing up from a chair using your arms (e.g., wheelchair or bedside chair)?: None Help needed to walk in hospital room?: None Help needed climbing 3-5 steps with a railing? : A Little 6 Click Score: 23    End of Session   Activity Tolerance: Patient  tolerated treatment well Patient left: Other (comment) (seated on commode with OT)   PT Visit Diagnosis: Other abnormalities of gait and mobility (R26.89);Difficulty in walking, not elsewhere classified (R26.2);Muscle weakness (generalized) (M62.81)    Time: 2956-2130 PT Time Calculation (min) (ACUTE ONLY): 12 min   Charges:   PT Evaluation $PT Eval Low Complexity: 1 Low   PT General Charges $$ ACUTE PT VISIT: 1 Visit         Darien Eden PT, DPT 11:37 AM,05/10/23

## 2023-05-10 NOTE — Group Note (Signed)
 Date:  05/10/2023 Time:  10:37 AM  Group Topic/Focus:  Building Self Esteem:   The Focus of this group is helping patients become aware of the effects of self-esteem on their lives, the things they and others do that enhance or undermine their self-esteem, seeing the relationship between their level of self-esteem and the choices they make and learning ways to enhance self-esteem.    Participation Level:  Active  Participation Quality:  Appropriate  Affect:  Appropriate  Cognitive:  Appropriate  Insight: Appropriate  Engagement in Group:  Engaged  Modes of Intervention:  Discussion    Dow Gemma 05/10/2023, 10:37 AM

## 2023-05-10 NOTE — Plan of Care (Signed)
   05/10/23 2200  Psych Admission Type (Psych Patients Only)  Admission Status Voluntary  Psychosocial Assessment  Patient Complaints Anxiety  Eye Contact Darting  Facial Expression Anxious  Affect Anxious  Speech Soft  Interaction Attention-seeking  Motor Activity Slow  Appearance/Hygiene UTA  Behavior Characteristics Anxious  Mood Sad;Preoccupied  Thought Process  Coherency WDL  Content Preoccupation  Delusions Paranoid  Perception Hallucinations  Hallucination Auditory  Judgment Impaired  Confusion Moderate  Danger to Self  Current suicidal ideation? Denies  Agreement Not to Harm Self No  Description of Agreement VERBAL  Danger to Others  Danger to Others None reported or observed   Problem: Education: Goal: Knowledge of [place] [facility] Education information/materials will improve Outcome: Progressing Goal: Emotional status will improve Outcome: Progressing Goal: Mental status will improve Outcome: Progressing Goal: Verbalization of understanding the information provided will improve Outcome: Progressing

## 2023-05-10 NOTE — Group Note (Signed)
 Recreation Therapy Group Note   Group Topic:Health and Wellness  Group Date: 05/10/2023 Start Time: 1100 End Time: 1135 Facilitators: Deatrice Factor, LRT, CTRS Location:  Dayroom  Group Description: Seated Exercise. LRT discussed the mental and physical benefits of exercise. LRT and group discussed how physical activity can be used as a coping skill. Pt's and LRT followed along to an exercise video on the TV screen that provided a visual representation and audio description of every exercise performed. Pt's encouraged to listen to their bodies and stop at any time if they experience feelings of discomfort or pain. Pts were encouraged to drink water and stay hydrated.   Goal Area(s) Addressed: Patient will learn benefits of physical activity. Patient will identify exercise as a coping skill.  Patient will follow multistep directions. Patient will try a new leisure interest.    Affect/Mood: N/A   Participation Level: Did not attend    Clinical Observations/Individualized Feedback: Patient did not attend group.   Plan: Continue to engage patient in RT group sessions 2-3x/week.   Deatrice Factor, LRT, CTRS 05/10/2023 12:58 PM

## 2023-05-10 NOTE — Plan of Care (Signed)
  Problem: Education: Goal: Knowledge of [place] [facility] Education information/materials will improve Outcome: Progressing Goal: Emotional status will improve Outcome: Progressing Goal: Mental status will improve Outcome: Progressing  Patient is isolative to her room this shift C/O Generalized body pain and Nausea at HS prn Tylenol and Zofran given and reported effective. Support and encouragement provided.

## 2023-05-10 NOTE — Evaluation (Signed)
 Occupational Therapy Evaluation Patient Details Name: Vicki Thomas MRN: [id number] DOB: 1960/10/23 Today's Date: 05/10/2023   History of Present Illness   Pt is a 63 yo female that presented to ED for hallucinations, SI, anxiety. PMH of bipolar, schizophrenia, chronic back pain, panic disorder with agoraphobia, PTSD, personality disorder, anxiety, fibromyalgia, recent UTI diagnosis.    Clinical Impressions Vicki Thomas was seen for OT evaluation this date. Prior to hospital admission, pt was MOD I using rollator. Pt lives alone. Pt currently requires SUPERVISION + RW for toilet t/f and simulated grooming tasks. Endorses fear of falling. Poor safety awareness - pt states the w/c locks are on before standing but when directed to look realizes brakes were not on. Pt would benefit from skilled OT to address noted impairments and functional limitations (see below for any additional details). Upon hospital discharge, recommend OT follow up on return home.     If plan is discharge home, recommend the following:   Help with stairs or ramp for entrance;Supervision due to cognitive status     Functional Status Assessment   Patient has had a recent decline in their functional status and demonstrates the ability to make significant improvements in function in a reasonable and predictable amount of time.     Equipment Recommendations   BSC/3in1     Recommendations for Other Services         Precautions/Restrictions   Precautions Precautions: Fall Recall of Precautions/Restrictions: Intact Restrictions Weight Bearing Restrictions Per Provider Order: No     Mobility Bed Mobility               General bed mobility comments: not tested    Transfers Overall transfer level: Needs assistance Equipment used: Rolling walker (2 wheels) Transfers: Sit to/from Stand Sit to Stand: Supervision                  Balance Overall balance assessment: Needs  assistance Sitting-balance support: Feet supported Sitting balance-Leahy Scale: Good     Standing balance support: No upper extremity supported, During functional activity Standing balance-Leahy Scale: Fair                             ADL either performed or assessed with clinical judgement   ADL Overall ADL's : Needs assistance/impaired                                       General ADL Comments: SUPERVISION + RW for toilet t/f and simulated grooming tasks      Pertinent Vitals/Pain Pain Assessment Pain Assessment: 0-10 Pain Score: 7  Pain Location: back and pelvis Pain Descriptors / Indicators: Aching, Sore, Stabbing Pain Intervention(s): Limited activity within patient's tolerance, Repositioned     Extremity/Trunk Assessment Upper Extremity Assessment Upper Extremity Assessment: Overall WFL for tasks assessed   Lower Extremity Assessment Lower Extremity Assessment: Generalized weakness       Communication Communication Communication: No apparent difficulties   Cognition Arousal: Alert Behavior During Therapy: WFL for tasks assessed/performed Cognition: No family/caregiver present to determine baseline             OT - Cognition Comments: poor safety awareness, states the w/c locks are on before standing but when directed to look realizes brakes were not on                 Following  commands: Intact                  Home Living Family/patient expects to be discharged to:: Private residence Living Arrangements: Alone Available Help at Discharge: Family;Available PRN/intermittently Type of Home: Apartment Home Access: Level entry     Home Layout: One level     Bathroom Shower/Tub: Chief Strategy Officer: Standard     Home Equipment: Rollator (4 wheels);Shower seat          Prior Functioning/Environment Prior Level of Function : Independent/Modified Independent             Mobility  Comments: rollator, reports sitting and self-propelling it ADLs Comments: reported modI for ADLs, cooks very minimally, often microwaves meals    OT Problem List: Decreased activity tolerance;Pain   OT Treatment/Interventions: Self-care/ADL training;Therapeutic exercise;Energy conservation;DME and/or AE instruction;Therapeutic activities      OT Goals(Current goals can be found in the care plan section)   Acute Rehab OT Goals Patient Stated Goal: to go home OT Goal Formulation: With patient Time For Goal Achievement: 05/24/23 Potential to Achieve Goals: Fair ADL Goals Pt Will Perform Tub/Shower Transfer: Tub transfer;with modified independence;3 in 1 Additional ADL Goal #1: Pt will verbalize plan to implent x3 falls prevention strategies Additional ADL Goal #2: Pt will complete Pill Box test with a passing score.   OT Frequency:  Min 1X/week    Co-evaluation              AM-PAC OT "6 Clicks" Daily Activity     Outcome Measure Help from another person eating meals?: None Help from another person taking care of personal grooming?: None Help from another person toileting, which includes using toliet, bedpan, or urinal?: A Little Help from another person bathing (including washing, rinsing, drying)?: A Little Help from another person to put on and taking off regular upper body clothing?: None Help from another person to put on and taking off regular lower body clothing?: A Little 6 Click Score: 21   End of Session    Activity Tolerance: Patient tolerated treatment well Patient left:  (seated in wheelchair)  OT Visit Diagnosis: Other abnormalities of gait and mobility (R26.89);Muscle weakness (generalized) (M62.81)                Time: 1610-9604 OT Time Calculation (min): 13 min Charges:  OT General Charges $OT Visit: 1 Visit OT Evaluation $OT Eval Low Complexity: 1 Low  Gordan Latina, M.S. OTR/L  05/10/23, 12:37 PM  ascom [phone]

## 2023-05-10 NOTE — Group Note (Signed)
 Date:  05/10/2023 Time:  9:20 PM  Group Topic/Focus:  Wrap-Up Group:   The focus of this group is to help patients review their daily goal of treatment and discuss progress on daily workbooks.    Participation Level:  Did Not Attend  Participation Quality:      Affect:      Cognitive:      Insight: None  Engagement in Group:      Modes of Intervention:      Additional Comments:    [name] 05/10/2023, 9:20 PM

## 2023-05-10 NOTE — Group Note (Signed)
 Recreation Therapy Group Note   Group Topic:Personal Development  Group Date: 05/10/2023 Start Time: 1330 End Time: 1500 Facilitators: Deatrice Factor, LRT, CTRS Location: Courtyard  Group Description: Therapeutic Gardening. LRT, NT, nursing instructor and nurse residents filled the raised garden beds with soil, flowers, herbs and seeds as a part of the nurse residents community engagement project. Patients took turns using handheld rakes and shovels to place the soil into the raised gardening beds. Patients then placed flowers, herbs, and/or a variety of seeds into the soil. Music was being played in the background.   Goal Area(s) Addressed:  Patient will increase physical activity Patient will engage in sensory stimulation.  Patient will increase socialization.  Patient will increase self-esteem, accomplishment and belonging.    Affect/Mood: N/A   Participation Level: Did not attend    Clinical Observations/Individualized Feedback: Patient did not attend group.   Plan: Continue to engage patient in RT group sessions 2-3x/week.   Deatrice Factor, LRT, CTRS 05/10/2023 4:21 PM

## 2023-05-10 NOTE — Progress Notes (Signed)
   05/10/23 1500  Psych Admission Type (Psych Patients Only)  Admission Status Voluntary  Psychosocial Assessment  Patient Complaints Anxiety  Eye Contact Brief  Facial Expression Anxious  Affect Anxious  Speech Soft  Interaction Attention-seeking;Minimal  Motor Activity Slow  Appearance/Hygiene Unremarkable  Behavior Characteristics Anxious  Mood Preoccupied;Anxious  Thought Process  Coherency WDL  Content Preoccupation  Delusions Paranoid  Perception Derealization  Hallucination None reported or observed  Judgment Impaired  Confusion Mild  Danger to Self  Current suicidal ideation? Denies  Agreement Not to Harm Self Yes  Description of Agreement verbal  Danger to Others  Danger to Others None reported or observed

## 2023-05-11 DIAGNOSIS — F200 Paranoid schizophrenia: Principal | ICD-10-CM

## 2023-05-11 MED ORDER — CLOTRIMAZOLE 1 % VA CREA
1.0000 | TOPICAL_CREAM | Freq: Every day | VAGINAL | Status: DC
  Administered 2023-05-12 – 2023-05-15 (×4): 1 via VAGINAL
  Filled 2023-05-11: qty 45

## 2023-05-11 MED ORDER — DICLOFENAC SODIUM 1 % EX GEL
2.0000 g | Freq: Four times a day (QID) | CUTANEOUS | Status: DC
Start: 2023-05-11 — End: 2023-05-18
  Administered 2023-05-11 – 2023-05-17 (×23): 2 g via TOPICAL
  Filled 2023-05-11: qty 100

## 2023-05-11 MED ORDER — RISPERIDONE 1 MG PO TBDP
0.5000 mg | ORAL_TABLET | Freq: Every day | ORAL | Status: DC
  Administered 2023-05-11 – 2023-05-17 (×7): 0.5 mg via ORAL
  Filled 2023-05-11 (×8): qty 0.5

## 2023-05-11 MED ORDER — RISPERIDONE 1 MG PO TBDP
1.0000 mg | ORAL_TABLET | Freq: Every day | ORAL | Status: DC
  Administered 2023-05-11 – 2023-05-12 (×2): 1 mg via ORAL
  Filled 2023-05-11 (×2): qty 1

## 2023-05-11 NOTE — Group Note (Signed)
 Date:  05/11/2023 Time:  10:47 AM  Group Topic/Focus:  Overcoming Stress:   The focus of this group is to define stress and help patients assess their triggers.    Participation Level:  Active  Participation Quality:  Appropriate  Affect:  Appropriate  Cognitive:  Appropriate  Insight: Appropriate  Engagement in Group:  Engaged  Modes of Intervention:  Discussion   Dow Gemma 05/11/2023, 10:47 AM

## 2023-05-11 NOTE — Group Note (Signed)
 Date:  05/11/2023 Time:  8:26 PM  Group Topic/Focus:  Identifying Needs:   The focus of this group is to help patients identify their personal needs that have been historically problematic and identify healthy behaviors to address their needs.    Participation Level:  Active  Participation Quality:  Appropriate  Affect:  Appropriate  Cognitive:  Appropriate  Insight: Appropriate  Engagement in Group:  Engaged  Modes of Intervention:  Education  Additional Comments:    Sherlie Distance 05/11/2023, 8:26 PM

## 2023-05-11 NOTE — Group Note (Signed)
 Therapy Group Note  Group Topic:Other  Group Date: 05/11/2023 Start Time: 1300 End Time: 1330 Facilitators: Campbell Agramonte, Otelia Blew, PT     Group Description: Group educated on sequence and techniques to maximize safety with functional transfers.  Additionally, integrated education on impact of seating surfaces, use of assistive device and management of orthostasis with movement transitions.  Patients actively engaged with functional transfers (sit/stand) from various seating surfaces, with and without assist devices, working to integrate and retain education provided during session.  Allowed time for questions and further discussion on mobility concerns/needs.   Therapeutic Goal(s): Identify and demonstrate safe technique for sit/stand transfers from various seating surfaces. Identify and demonstrate safe use of assistive devices with basic transfers and simple mobility. Identify and demonstrate ability to recognize signs/symptoms of orthostasis and appropriate compensatory/safety techniques.  Individual Participation: Did not attend      Participation Level:   Participation Quality:   Behavior:   Speech/Thought Process:   Affect/Mood:   Insight:   Judgement:   Individualization:   Modes of Intervention:   Patient Response to Interventions:    Plan: Continue to engage patient in OT groups 1 - 2x/week.  Lavenia Post PT, DPT 05/11/23, 1:56 PM

## 2023-05-11 NOTE — Progress Notes (Signed)
 Patient is a voluntary admission to [facility] for Schizophrenia and non medication compliance.  Hx of Bipolar and personality disorder.  Mostly somatic complaints today but has been less labile in her moods.  Advised patient today that since she doesn't use a wheelchair at home and is able to walk with walker to start utilizing it in anticipation of discharge.  Denies SI, HI, aVH, anxiety and depression. Started on voltaren gel today for chronic back pain and uses prn tylenol as well.  Interacts well with peers and seeks their attention.  Will continue to monitor.

## 2023-05-11 NOTE — BH IP Treatment Plan (Signed)
 Interdisciplinary Treatment and Diagnostic Plan Update  05/11/2023 Time of Session: 9:45 AM  Vicki Thomas MRN: [id number]  Principal Diagnosis: Schizophrenia [facility])  Secondary Diagnoses: Principal Problem:   Schizophrenia (HCC)   Current Medications:  Current Facility-Administered Medications  Medication Dose Route Frequency Provider Last Rate Last Admin   acetaminophen (TYLENOL) tablet 650 mg  650 mg Oral Q4H PRN Eligha Bridegroom, NP   650 mg at 05/11/23 0957   alum & mag hydroxide-simeth (MAALOX/MYLANTA) 200-200-20 MG/5ML suspension 30 mL  30 mL Oral Q4H PRN Eligha Bridegroom, NP       clonazePAM Scarlette Calico) tablet 0.5 mg  0.5 mg Oral BID Verner Chol, MD   0.5 mg at 05/11/23 8657   clotrimazole (GYNE-LOTRIMIN) vaginal cream 1 Applicatorful  1 Applicatorful Vaginal QHS Verner Chol, MD       diclofenac Sodium (VOLTAREN) 1 % topical gel 2 g  2 g Topical QID Verner Chol, MD   2 g at 05/11/23 1019   hydrOXYzine (ATARAX) tablet 50 mg  50 mg Oral Q6H PRN Verner Chol, MD       magnesium hydroxide (MILK OF MAGNESIA) suspension 30 mL  30 mL Oral Daily PRN Eligha Bridegroom, NP   30 mL at 05/11/23 1320   OLANZapine (ZYPREXA) injection 5 mg  5 mg Intramuscular TID PRN Eligha Bridegroom, NP   5 mg at 05/08/23 2224   OLANZapine (ZYPREXA) tablet 2.5 mg  2.5 mg Oral Q8H PRN Eligha Bridegroom, NP   2.5 mg at 05/08/23 0841   ondansetron (ZOFRAN-ODT) disintegrating tablet 4 mg  4 mg Oral Q8H PRN Eligha Bridegroom, NP   4 mg at 05/10/23 2233   risperiDONE (RISPERDAL M-TABS) disintegrating tablet 0.5 mg  0.5 mg Oral Daily Verner Chol, MD   0.5 mg at 05/11/23 1018   risperiDONE (RISPERDAL M-TABS) disintegrating tablet 1 mg  1 mg Oral QHS Verner Chol, MD       PTA Medications: Medications Prior to Admission  Medication Sig Dispense Refill Last Dose/Taking   aspirin-sod bicarb-citric acid (ALKA-SELTZER) 325 MG TBEF tablet Take 325 mg by mouth every 6 (six) hours as needed.      bisacodyl  (DULCOLAX) 5 MG EC tablet Take 5 mg by mouth daily as needed for moderate constipation.      ciprofloxacin (CIPRO) 500 MG tablet Take 1 tablet (500 mg total) by mouth 2 (two) times daily. One po bid x 7 days (Patient not taking: Reported on 05/05/2023) 14 tablet 0    guaiFENesin (ROBITUSSIN) 100 MG/5ML liquid Take 5 mLs by mouth every 4 (four) hours as needed for cough or to loosen phlegm.      ibuprofen (ADVIL) 200 MG tablet Take 400 mg by mouth every 6 (six) hours as needed for moderate pain (pain score 4-6).      OVER THE COUNTER MEDICATION Indegestion and gas      polyethylene glycol (MIRALAX / GLYCOLAX) 17 g packet Take 17 g by mouth daily.      Potassium 99 MG TABS Take 1 tablet by mouth daily.      potassium chloride (K-DUR) 10 MEQ tablet Take 1 tablet (10 mEq total) by mouth daily. (Patient not taking: Reported on 05/05/2023) 10 tablet 0    Pseudoeph-Doxylamine-DM-APAP (NYQUIL MULTI-SYMPTOM PO) Take by mouth.      simethicone (MYLICON) 125 MG chewable tablet Chew 125 mg by mouth every 6 (six) hours as needed for flatulence.      Zinc Oxide 16 % OINT Apply topically.  Patient Stressors:    Patient Strengths:    Treatment Modalities: Medication Management, Group therapy, Case management,  1 to 1 session with clinician, Psychoeducation, Recreational therapy.   Physician Treatment Plan for Primary Diagnosis: Schizophrenia (HCC) Long Term Goal(s): Improvement in symptoms so as ready for discharge   Short Term Goals: Ability to identify changes in lifestyle to reduce recurrence of condition will improve Ability to verbalize feelings will improve Ability to disclose and discuss suicidal ideas Ability to demonstrate self-control will improve Ability to identify and develop effective coping behaviors will improve Ability to maintain clinical measurements within normal limits will improve Compliance with prescribed medications will improve  Medication Management: Evaluate patient's  response, side effects, and tolerance of medication regimen.  Therapeutic Interventions: 1 to 1 sessions, Unit Group sessions and Medication administration.  Evaluation of Outcomes: Progressing  Physician Treatment Plan for Secondary Diagnosis: Principal Problem:   Schizophrenia (HCC)  Long Term Goal(s): Improvement in symptoms so as ready for discharge   Short Term Goals: Ability to identify changes in lifestyle to reduce recurrence of condition will improve Ability to verbalize feelings will improve Ability to disclose and discuss suicidal ideas Ability to demonstrate self-control will improve Ability to identify and develop effective coping behaviors will improve Ability to maintain clinical measurements within normal limits will improve Compliance with prescribed medications will improve     Medication Management: Evaluate patient's response, side effects, and tolerance of medication regimen.  Therapeutic Interventions: 1 to 1 sessions, Unit Group sessions and Medication administration.  Evaluation of Outcomes: Progressing   RN Treatment Plan for Primary Diagnosis: Schizophrenia (HCC) Long Term Goal(s): Knowledge of disease and therapeutic regimen to maintain health will improve  Short Term Goals: Ability to remain free from injury will improve, Ability to verbalize frustration and anger appropriately will improve, Ability to demonstrate self-control, Ability to participate in decision making will improve, Ability to verbalize feelings will improve, Ability to disclose and discuss suicidal ideas, Ability to identify and develop effective coping behaviors will improve, and Compliance with prescribed medications will improve  Medication Management: RN will administer medications as ordered by provider, will assess and evaluate patient's response and provide education to patient for prescribed medication. RN will report any adverse and/or side effects to prescribing  provider.  Therapeutic Interventions: 1 on 1 counseling sessions, Psychoeducation, Medication administration, Evaluate responses to treatment, Monitor vital signs and CBGs as ordered, Perform/monitor CIWA, COWS, AIMS and Fall Risk screenings as ordered, Perform wound care treatments as ordered.  Evaluation of Outcomes: Progressing   LCSW Treatment Plan for Primary Diagnosis: Schizophrenia (HCC) Long Term Goal(s): Safe transition to appropriate next level of care at discharge, Engage patient in therapeutic group addressing interpersonal concerns.  Short Term Goals: Engage patient in aftercare planning with referrals and resources, Increase social support, Increase ability to appropriately verbalize feelings, Increase emotional regulation, Facilitate acceptance of mental health diagnosis and concerns, Facilitate patient progression through stages of change regarding substance use diagnoses and concerns, Identify triggers associated with mental health/substance abuse issues, and Increase skills for wellness and recovery  Therapeutic Interventions: Assess for all discharge needs, 1 to 1 time with Social worker, Explore available resources and support systems, Assess for adequacy in community support network, Educate family and significant other(s) on suicide prevention, Complete Psychosocial Assessment, Interpersonal group therapy.  Evaluation of Outcomes: Progressing   Progress in Treatment: Attending groups: Yes. Participating in groups: Yes. Taking medication as prescribed: Yes. Toleration medication: Yes. Family/Significant other contact made: No, will contact:  once permission has been granted.  Patient understands diagnosis: Yes. Discussing patient identified problems/goals with staff: Yes. Medical problems stabilized or resolved: Yes. Denies suicidal/homicidal ideation: Yes. Issues/concerns per patient self-inventory: No. Other: none   New problem(s) identified: No, Describe:  none  Update 05/11/23: No changes at this time   New Short Term/Long Term Goal(s): detox, elimination of symptoms of psychosis, medication management for mood stabilization; elimination of SI thoughts; development of comprehensive mental wellness/sobriety plan. Update 05/11/23: No changes at this time Update 05/11/23: No changes at this time       Patient Goals:  "to get into some program" Update 05/11/23: No changes at this time     Discharge Plan or Barriers: CSW to assist patient in development of appropriate discharge plans. Update 05/11/23: No changes at this time     Reason for Continuation of Hospitalization: Anxiety Depression Medication stabilization Suicidal ideation   Estimated Length of Stay:  1-7 days Update 05/11/23: TBD      Last 3 [place] Suicide Severity Risk Score: Flowsheet Row Admission (Current) from 05/05/2023 in [facility] ED from 05/04/2023 in [facility] Department at [facility] ED from 05/03/2023 in [facility] Department at [facility]  C-SSRS RISK CATEGORY High Risk High Risk No Risk       Last [facility] 2/9 Scores:    12/10/2015   12:18 PM  Depression screen PHQ 2/9  Decreased Interest 3  Down, Depressed, Hopeless 2  PHQ - 2 Score 5  Altered sleeping 3  Tired, decreased energy 1  Change in appetite 3  Feeling bad or failure about yourself  3  Trouble concentrating 1  Moving slowly or fidgety/restless 3  Suicidal thoughts 0  PHQ-9 Score 19  Difficult doing work/chores Somewhat difficult    Scribe for Treatment Team: Claudio Culver, Milinda Allen 05/11/2023 1:55 PM

## 2023-05-11 NOTE — Progress Notes (Signed)
   05/11/23 2200  Psych Admission Type (Psych Patients Only)  Admission Status Voluntary  Psychosocial Assessment  Patient Complaints Anxiety  Eye Contact Fair  Facial Expression Anxious;Flat  Affect Anxious  Speech Soft  Interaction Minimal  Motor Activity Slow  Appearance/Hygiene Unremarkable  Behavior Characteristics Cooperative  Mood Anxious;Pleasant  Thought Process  Coherency WDL  Content Preoccupation  Delusions None reported or observed  Perception Derealization  Hallucination None reported or observed  Judgment Impaired  Confusion Mild  Danger to Self  Current suicidal ideation? Denies  Agreement Not to Harm Self Yes  Description of Agreement verbal  Danger to Others  Danger to Others None reported or observed

## 2023-05-11 NOTE — Progress Notes (Signed)
 [facility] MD Progress Note  05/11/2023 2:31 PM Vicki Thomas  MRN:  [id number]  Vicki Thomas is a 63 y.o. female admitted: Presented to the EDfor 05/04/2023  5:23 PM for hallucinations, suicidal ideations. She carries the psychiatric diagnoses of schizophrenia, bipolar and has a past medical history of  UTI. Her current presentation of suicidal ideations, hallucinations, paranoia is most consistent with schizophrenia.Patient is admitted to [facility] unit with Q15 min safety monitoring. Multidisciplinary team approach is offered. Medication management; group/milieu therapy is offered.  Subjective:  Chart reviewed, case discussed in multidisciplinary meeting, patient seen during rounds.  Patient is noted to be in her room.  She continues to be focused on her pain and needing some ointments for her vaginal itching and pain medication for her back.  She rates her anxiety as 7 out of 10, 10 being worst and depression as 7 out of 10 but denies any SI/HI/plan.  She reports hearing some voices today morning.  Provider discussed Risperdal not being in her allergy list and Risperdal coming in LAI form.  Patient agreed to switch her Zyprexa to Risperdal to help with the voices and also available as LAI.    patient's daughter Tamera Punt [phone]  Sleep: Fair  Appetite:  Fair  Past Psychiatric History: see h&P Family History:  Family History  Problem Relation Age of Onset   Alcohol abuse Mother    Bipolar disorder Father    Paranoid behavior Father    Alcohol abuse Father    Bipolar disorder Sister    Anxiety disorder Sister    Alcohol abuse Brother    Drug abuse Brother    Bipolar disorder Paternal Aunt    Schizophrenia Maternal Grandfather    Anxiety disorder Maternal Aunt    Social History:  Social History   Substance and Sexual Activity  Alcohol Use No     Social History   Substance and Sexual Activity  Drug Use No    Social History   Socioeconomic History   Marital status: Single     Spouse name: Not on file   Number of children: Not on file   Years of education: Not on file   Highest education level: Not on file  Occupational History   Not on file  Tobacco Use   Smoking status: Former    Current packs/day: 1.00    Average packs/day: 1 pack/day for 6.0 years (6.0 ttl pk-yrs)    Types: Cigarettes   Smokeless tobacco: Never  Vaping Use   Vaping status: Never Used  Substance and Sexual Activity   Alcohol use: No   Drug use: No   Sexual activity: Not Currently  Other Topics Concern   Not on file  Social History Narrative   Not on file   Social Drivers of Health   Financial Resource Strain: Not on file  Food Insecurity: Patient Declined (05/05/2023)   Hunger Vital Sign    Worried About Running Out of Food in the Last Year: Patient declined    Ran Out of Food in the Last Year: Patient declined  Transportation Needs: No Transportation Needs (05/05/2023)   PRAPARE - Administrator, Civil Service (Medical): No    Lack of Transportation (Non-Medical): No  Physical Activity: Not on file  Stress: Not on file  Social Connections: Unknown (05/30/2021)   Received from [facility]   Social Network    Social Network: Not on file   Past Medical History:  Past Medical History:  Diagnosis Date  Anxiety    Arthritis    Bipolar 1 disorder (HCC)    Chronic back pain    Chronic fatigue    Chronic headaches    Chronic pain    Fibromyalgia    Hiatal hernia    History of borderline personality disorder    Interstitial cystitis    Lymphedema    Panic disorder with agoraphobia    PTSD (post-traumatic stress disorder)     Past Surgical History:  Procedure Laterality Date   ABDOMINAL HYSTERECTOMY     APPENDECTOMY     BARIATRIC SURGERY     CHOLECYSTECTOMY     ESOPHAGUS SURGERY     FOOT SURGERY     HEMORRHOID SURGERY     HERNIA REPAIR     TONSILLECTOMY      Current Medications: Current Facility-Administered Medications  Medication Dose Route  Frequency Provider Last Rate Last Admin   acetaminophen (TYLENOL) tablet 650 mg  650 mg Oral Q4H PRN Roise Cleaver, NP   650 mg at 05/11/23 0957   alum & mag hydroxide-simeth (MAALOX/MYLANTA) 200-200-20 MG/5ML suspension 30 mL  30 mL Oral Q4H PRN Roise Cleaver, NP       clonazePAM (KLONOPIN) tablet 0.5 mg  0.5 mg Oral BID Ernie Kasler, MD   0.5 mg at 05/11/23 0950   clotrimazole (GYNE-LOTRIMIN) vaginal cream 1 Applicatorful  1 Applicatorful Vaginal QHS Kailah Pennel, MD       diclofenac Sodium (VOLTAREN) 1 % topical gel 2 g  2 g Topical QID Gayl Ivanoff, MD   2 g at 05/11/23 1019   hydrOXYzine (ATARAX) tablet 50 mg  50 mg Oral Q6H PRN Francille Wittmann, MD       magnesium hydroxide (MILK OF MAGNESIA) suspension 30 mL  30 mL Oral Daily PRN Roise Cleaver, NP   30 mL at 05/11/23 1320   OLANZapine (ZYPREXA) injection 5 mg  5 mg Intramuscular TID PRN Roise Cleaver, NP   5 mg at 05/08/23 2224   OLANZapine (ZYPREXA) tablet 2.5 mg  2.5 mg Oral Q8H PRN Roise Cleaver, NP   2.5 mg at 05/08/23 0841   ondansetron (ZOFRAN-ODT) disintegrating tablet 4 mg  4 mg Oral Q8H PRN Roise Cleaver, NP   4 mg at 05/10/23 2233   risperiDONE (RISPERDAL M-TABS) disintegrating tablet 0.5 mg  0.5 mg Oral Daily Roisin Mones, MD   0.5 mg at 05/11/23 1018   risperiDONE (RISPERDAL M-TABS) disintegrating tablet 1 mg  1 mg Oral QHS Dontel Harshberger, MD        Lab Results: No results found for this or any previous visit (from the past 48 hours).  Blood Alcohol level:  Lab Results  Component Value Date   ETH <10 05/04/2023   ETH <11 01/13/2014    Metabolic Disorder Labs: No results found for: "HGBA1C", "MPG" No results found for: "PROLACTIN" No results found for: "CHOL", "TRIG", "HDL", "CHOLHDL", "VLDL", "LDLCALC"  Physical Findings: AIMS:  , ,  ,  ,    CIWA:    COWS:      Psychiatric Specialty Exam:  Presentation  General Appearance:  Appropriate for Environment; Casual  Eye  Contact: Fair  Speech: Clear and Coherent  Speech Volume: Normal    Mood and Affect  Mood: Anxious  Affect: Appropriate   Thought Process  Thought Processes: Coherent  Descriptions of Associations:Intact  Orientation:Partial  Thought Content:Illogical  Hallucinations:Hallucinations: Auditory Description of Auditory Hallucinations: not command type  Ideas of Reference:None  Suicidal Thoughts:Suicidal Thoughts: No  Homicidal Thoughts:Homicidal  Thoughts: No   Sensorium  Memory: Immediate Fair; Recent Fair; Remote Poor  Judgment: Impaired  Insight: Shallow   Executive Functions  Concentration: Fair  Attention Span: Fair  Recall: Fair  Fund of Knowledge: Fair  Language: Fair   Psychomotor Activity  Psychomotor Activity: Psychomotor Activity: Normal  Musculoskeletal: Strength & Muscle Tone: decreased Gait & Station: unsteady Assets  Assets: Manufacturing systems engineer; Desire for Improvement; Social Support; Resilience    Physical Exam: Physical Exam Vitals and nursing note reviewed.  HENT:     Head: Normocephalic.     Right Ear: Tympanic membrane normal.  Cardiovascular:     Rate and Rhythm: Normal rate.     Pulses: Normal pulses.  Skin:    General: Skin is warm.  Neurological:     Mental Status: She is alert.    Review of Systems  Constitutional:  Positive for malaise/fatigue.  HENT: Negative.    Eyes: Negative.   Cardiovascular: Negative.   Skin: Negative.    Blood pressure (!) 114/59, pulse 78, temperature (!) 97.5 F (36.4 C), resp. rate 16, height 5\' 7"  (1.702 m), weight 67.4 kg, SpO2 100%. Body mass index is 23.26 kg/m.  Diagnosis: Principal Problem:   Schizophrenia [facility])  Clinical Decision Making: Patient with reported history of schizophrenia, anxiety admitted for suicidal ideation and worsening anxiety with some paranoia.  Patient is unable to identify any triggers.  No history of substance use currently.   Patient has severe anxiety.  She needs inpatient hospitalization for further stabilization   Treatment Plan Summary:   Safety and Monitoring:             -- Voluntary admission to inpatient psychiatric unit for safety, stabilization and treatment             -- Daily contact with patient to assess and evaluate symptoms and progress in treatment             -- Patient's case to be discussed in multi-disciplinary team meeting             -- Observation Level: q15 minute checks             -- Vital signs:  q12 hours             -- Precautions: suicide, elopement, and assault   2. Psychiatric Diagnoses and Treatment:  Patient is requiring multiple as needed Ativan for anxiety-discontinued Ativan as needed and added Klonopin 0.5 twice daily given severe anxiety displayed by the patient Discontinued Zyprexa 7.5 nightly due to poor efficacy Risperdal is not in the allergy section-added Risperdal 0.5 mg every morning and 1 mg nightly with the plan to use LAI Per chart patient has documentation of multiple psychotropics having allergic reaction to.     -- The risks/benefits/side-effects/alternatives to this medication were discussed in detail with the patient and time was given for questions. The patient consents to medication trial.                -- Metabolic profile and EKG monitoring obtained while on an atypical antipsychotic (BMI: Lipid Panel: HbgA1c: QTc:)              -- Encouraged patient to participate in unit milieu and in scheduled group therapies                            3. Medical Issues Being Addressed:  No urgent medical needs identified UTI patient is currently  prescribed antibiotics   4. Discharge Planning:              -- Social work and case management to assist with discharge planning and identification of hospital follow-up needs prior to discharge             -- Estimated LOS: 5-7 days             -- Discharge Concerns: Need to establish a safety plan; Medication  compliance and effectiveness             -- Discharge Goals: Return home with outpatient referrals follow ups   Physician Treatment Plan for Primary Diagnosis: Schizophrenia (HCC) Long Term Goal(s): Improvement in symptoms so as ready for discharge   Short Term Goals: Ability to identify changes in lifestyle to reduce recurrence of condition will improve, Ability to verbalize feelings will improve, Ability to disclose and discuss suicidal ideas, Ability to demonstrate self-control will improve, and Ability to identify and develop effective coping behaviors will improve   Physician Treatment Plan for Secondary Diagnosis: Principal Problem:   Schizophrenia (HCC)   Long Term Goal(s): Improvement in symptoms so as ready for discharge   Short Term Goals: Ability to identify changes in lifestyle to reduce recurrence of condition will improve, Ability to verbalize feelings will improve, Ability to disclose and discuss suicidal ideas, Ability to demonstrate self-control will improve, Ability to identify and develop effective coping behaviors will improve, Ability to maintain clinical measurements within normal limits will improve, and Compliance with prescribed medications will improve    Aurelia Blotter, MD 05/11/2023, 2:31 PM

## 2023-05-12 DIAGNOSIS — F200 Paranoid schizophrenia: Secondary | ICD-10-CM | POA: Diagnosis not present

## 2023-05-12 NOTE — Progress Notes (Signed)
   05/12/23 2300  Psych Admission Type (Psych Patients Only)  Admission Status Voluntary  Psychosocial Assessment  Patient Complaints Anxiety;Depression  Eye Contact Fair  Facial Expression Flat  Affect Anxious  Speech Soft  Interaction Needy  Motor Activity Slow  Appearance/Hygiene Unremarkable  Behavior Characteristics Cooperative  Mood Pleasant;Anxious;Preoccupied  Thought Process  Coherency Circumstantial  Content Preoccupation  Delusions None reported or observed  Perception Hallucinations  Hallucination Auditory;Visual  Judgment Impaired  Confusion Mild  Danger to Self  Current suicidal ideation? Denies (Denies)  Agreement Not to Harm Self Yes  Description of Agreement verbal  Danger to Others  Danger to Others None reported or observed

## 2023-05-12 NOTE — Progress Notes (Signed)
 [facility] MD Progress Note  05/12/2023 3:20 PM Alvin Rubano  MRN:  [id number]  Vicki Thomas is a 63 y.o. female admitted: Presented to the EDfor 05/04/2023  5:23 PM for hallucinations, suicidal ideations. She carries the psychiatric diagnoses of schizophrenia, bipolar and has a past medical history of  UTI. Her current presentation of suicidal ideations, hallucinations, paranoia is most consistent with schizophrenia.Patient is admitted to [facility] unit with Q15 min safety monitoring. Multidisciplinary team approach is offered. Medication management; group/milieu therapy is offered.  Subjective:  Chart reviewed, case discussed in multidisciplinary meeting, patient seen during rounds.  Today on interview patient is noted to be in her room sleeping.  She reports feeling tired.  She reports that she slept well last night and she felt she is going into deep sleep but woke up.  Per nursing patient did not sleep well last night.  Patient is very weak and inconsistent in her reports.  She continues to report hearing voices saying magical things but unable to give details.  She denies SI/HI/intent/plan.  She reports her anxiety as 7 out of 10, 10 being worse.  Patient is tolerating Risperdal well with no reported side effects including EPS.  patient's daughter Tamera Punt [phone]  Sleep: Fair  Appetite:  Fair  Past Psychiatric History: see h&P Family History:  Family History  Problem Relation Age of Onset   Alcohol abuse Mother    Bipolar disorder Father    Paranoid behavior Father    Alcohol abuse Father    Bipolar disorder Sister    Anxiety disorder Sister    Alcohol abuse Brother    Drug abuse Brother    Bipolar disorder Paternal Aunt    Schizophrenia Maternal Grandfather    Anxiety disorder Maternal Aunt    Social History:  Social History   Substance and Sexual Activity  Alcohol Use No     Social History   Substance and Sexual Activity  Drug Use No    Social History    Socioeconomic History   Marital status: Single    Spouse name: Not on file   Number of children: Not on file   Years of education: Not on file   Highest education level: Not on file  Occupational History   Not on file  Tobacco Use   Smoking status: Former    Current packs/day: 1.00    Average packs/day: 1 pack/day for 6.0 years (6.0 ttl pk-yrs)    Types: Cigarettes   Smokeless tobacco: Never  Vaping Use   Vaping status: Never Used  Substance and Sexual Activity   Alcohol use: No   Drug use: No   Sexual activity: Not Currently  Other Topics Concern   Not on file  Social History Narrative   Not on file   Social Drivers of Health   Financial Resource Strain: Not on file  Food Insecurity: Patient Declined (05/05/2023)   Hunger Vital Sign    Worried About Running Out of Food in the Last Year: Patient declined    Ran Out of Food in the Last Year: Patient declined  Transportation Needs: No Transportation Needs (05/05/2023)   PRAPARE - Administrator, Civil Service (Medical): No    Lack of Transportation (Non-Medical): No  Physical Activity: Not on file  Stress: Not on file  Social Connections: Unknown (05/30/2021)   Received from [facility]   Social Network    Social Network: Not on file   Past Medical History:  Past Medical History:  Diagnosis Date   Anxiety    Arthritis    Bipolar 1 disorder (HCC)    Chronic back pain    Chronic fatigue    Chronic headaches    Chronic pain    Fibromyalgia    Hiatal hernia    History of borderline personality disorder    Interstitial cystitis    Lymphedema    Panic disorder with agoraphobia    PTSD (post-traumatic stress disorder)     Past Surgical History:  Procedure Laterality Date   ABDOMINAL HYSTERECTOMY     APPENDECTOMY     BARIATRIC SURGERY     CHOLECYSTECTOMY     ESOPHAGUS SURGERY     FOOT SURGERY     HEMORRHOID SURGERY     HERNIA REPAIR     TONSILLECTOMY      Current Medications: Current  Facility-Administered Medications  Medication Dose Route Frequency Provider Last Rate Last Admin   acetaminophen (TYLENOL) tablet 650 mg  650 mg Oral Q4H PRN Roise Cleaver, NP   650 mg at 05/12/23 0436   alum & mag hydroxide-simeth (MAALOX/MYLANTA) 200-200-20 MG/5ML suspension 30 mL  30 mL Oral Q4H PRN Roise Cleaver, NP   30 mL at 05/12/23 1211   clonazePAM (KLONOPIN) tablet 0.5 mg  0.5 mg Oral BID Yliana Gravois, MD   0.5 mg at 05/12/23 0830   clotrimazole (GYNE-LOTRIMIN) vaginal cream 1 Applicatorful  1 Applicatorful Vaginal QHS Kamariyah Timberlake, MD       diclofenac Sodium (VOLTAREN) 1 % topical gel 2 g  2 g Topical QID Raife Lizer, MD   2 g at 05/12/23 0835   hydrOXYzine (ATARAX) tablet 50 mg  50 mg Oral Q6H PRN Syndey Jaskolski, MD   50 mg at 05/11/23 2122   magnesium hydroxide (MILK OF MAGNESIA) suspension 30 mL  30 mL Oral Daily PRN Roise Cleaver, NP   30 mL at 05/11/23 1320   OLANZapine (ZYPREXA) injection 5 mg  5 mg Intramuscular TID PRN Roise Cleaver, NP   5 mg at 05/08/23 2224   OLANZapine (ZYPREXA) tablet 2.5 mg  2.5 mg Oral Q8H PRN Roise Cleaver, NP   2.5 mg at 05/08/23 0841   ondansetron (ZOFRAN-ODT) disintegrating tablet 4 mg  4 mg Oral Q8H PRN Roise Cleaver, NP   4 mg at 05/10/23 2233   risperiDONE (RISPERDAL M-TABS) disintegrating tablet 0.5 mg  0.5 mg Oral Daily Torren Maffeo, MD   0.5 mg at 05/12/23 0833   risperiDONE (RISPERDAL M-TABS) disintegrating tablet 1 mg  1 mg Oral QHS Olaf Mesa, MD   1 mg at 05/11/23 2122    Lab Results: No results found for this or any previous visit (from the past 48 hours).  Blood Alcohol level:  Lab Results  Component Value Date   ETH <10 05/04/2023   ETH <11 01/13/2014    Metabolic Disorder Labs: No results found for: "HGBA1C", "MPG" No results found for: "PROLACTIN" No results found for: "CHOL", "TRIG", "HDL", "CHOLHDL", "VLDL", "LDLCALC"  Physical Findings: AIMS:  , ,  ,  ,    CIWA:    COWS:       Psychiatric Specialty Exam:  Presentation  General Appearance:  Appropriate for Environment; Casual  Eye Contact: Fair  Speech: Clear and Coherent  Speech Volume: Normal    Mood and Affect  Mood: Anxious (chronic)  Affect: Appropriate   Thought Process  Thought Processes: Irrevelant  Descriptions of Associations:Intact  Orientation:Partial  Thought Content:Illogical  Hallucinations:Hallucinations: Auditory Description of Auditory Hallucinations: not  command type, chronic, talks about magical things, pt is not repsonding to internal stimuli  Ideas of Reference:None  Suicidal Thoughts:Suicidal Thoughts: No  Homicidal Thoughts:Homicidal Thoughts: No   Sensorium  Memory: Recent Fair; Immediate Fair; Remote Fair  Judgment: Impaired  Insight: Shallow   Executive Functions  Concentration: Fair  Attention Span: Fair  Recall: [employer] of Knowledge: Fair  Language: Fair   Psychomotor Activity  Psychomotor Activity: Psychomotor Activity: Normal  Musculoskeletal: Strength & Muscle Tone: decreased Gait & Station: unsteady Assets  Assets: Manufacturing systems engineer; Desire for Improvement; Resilience; Social Support    Physical Exam: Physical Exam Vitals and nursing note reviewed.  HENT:     Head: Normocephalic.     Right Ear: Tympanic membrane normal.  Cardiovascular:     Rate and Rhythm: Normal rate.     Pulses: Normal pulses.  Skin:    General: Skin is warm.  Neurological:     Mental Status: She is alert.    Review of Systems  Constitutional:  Positive for malaise/fatigue.  HENT: Negative.    Eyes: Negative.   Cardiovascular: Negative.   Skin: Negative.    Blood pressure 131/61, pulse 88, temperature 98.3 F (36.8 C), resp. rate 18, height 5\' 7"  (1.702 m), weight 67.4 kg, SpO2 96%. Body mass index is 23.26 kg/m.  Diagnosis: Principal Problem:   Schizophrenia [facility])  Clinical Decision Making: Patient with reported  history of schizophrenia, anxiety admitted for suicidal ideation and worsening anxiety with some paranoia.  Patient is unable to identify any triggers.  No history of substance use currently.  Patient has severe anxiety.  She needs inpatient hospitalization for further stabilization   Treatment Plan Summary:   Safety and Monitoring:             -- Voluntary admission to inpatient psychiatric unit for safety, stabilization and treatment             -- Daily contact with patient to assess and evaluate symptoms and progress in treatment             -- Patient's case to be discussed in multi-disciplinary team meeting             -- Observation Level: q15 minute checks             -- Vital signs:  q12 hours             -- Precautions: suicide, elopement, and assault   2. Psychiatric Diagnoses and Treatment:  Patient is requiring multiple as needed Ativan for anxiety-discontinued Ativan as needed and added Klonopin 0.5 twice daily given severe anxiety displayed by the patient Discontinued Zyprexa 7.5 nightly due to poor efficacy Risperdal is not in the allergy section-added Risperdal 0.5 mg every morning and 1 mg nightly with the plan to use LAI Per chart patient has documentation of multiple psychotropics having allergic reaction to.     -- The risks/benefits/side-effects/alternatives to this medication were discussed in detail with the patient and time was given for questions. The patient consents to medication trial.                -- Metabolic profile and EKG monitoring obtained while on an atypical antipsychotic (BMI: Lipid Panel: HbgA1c: QTc:)              -- Encouraged patient to participate in unit milieu and in scheduled group therapies  3. Medical Issues Being Addressed:  No urgent medical needs identified UTI patient is currently prescribed antibiotics   4. Discharge Planning:              -- Social work and case management to assist with discharge  planning and identification of hospital follow-up needs prior to discharge             -- Estimated LOS: 5-7 days             -- Discharge Concerns: Need to establish a safety plan; Medication compliance and effectiveness             -- Discharge Goals: Return home with outpatient referrals follow ups   Physician Treatment Plan for Primary Diagnosis: Schizophrenia (HCC) Long Term Goal(s): Improvement in symptoms so as ready for discharge   Short Term Goals: Ability to identify changes in lifestyle to reduce recurrence of condition will improve, Ability to verbalize feelings will improve, Ability to disclose and discuss suicidal ideas, Ability to demonstrate self-control will improve, and Ability to identify and develop effective coping behaviors will improve   Physician Treatment Plan for Secondary Diagnosis: Principal Problem:   Schizophrenia (HCC)   Long Term Goal(s): Improvement in symptoms so as ready for discharge   Short Term Goals: Ability to identify changes in lifestyle to reduce recurrence of condition will improve, Ability to verbalize feelings will improve, Ability to disclose and discuss suicidal ideas, Ability to demonstrate self-control will improve, Ability to identify and develop effective coping behaviors will improve, Ability to maintain clinical measurements within normal limits will improve, and Compliance with prescribed medications will improve    Aurelia Blotter, MD 05/12/2023, 3:20 PM

## 2023-05-12 NOTE — Plan of Care (Signed)
   Problem: Education: Goal: Knowledge of [facility] Education information/materials will improve Outcome: Progressing Goal: Emotional status will improve Outcome: Progressing Goal: Mental status will improve Outcome: Progressing Goal: Verbalization of understanding the information provided will improve Outcome: Progressing

## 2023-05-12 NOTE — Group Note (Signed)
 Date:  05/12/2023 Time:  11:00 PM  Group Topic/Focus:  Wrap-Up Group:   The focus of this group is to help patients review their daily goal of treatment and discuss progress on daily workbooks.    Participation Level:  Did Not Attend  Bradley Caffey 05/12/2023, 11:00 PM

## 2023-05-12 NOTE — Group Note (Signed)
 Recreation Therapy Group Note   Group Topic:Coping Skills  Group Date: 05/12/2023 Start Time: 1100 End Time: 1135 Facilitators: Deatrice Factor, LRT, CTRS Location:  Dayroom  Group Description: PMR (Progressive Muscle Relaxation). LRT educates patients on what PMR is and the benefits that come from it. Patients are asked to sit with their feet flat on the floor while sitting up and all the way back in their chair, if possible. LRT and pts follow a prompt through a speaker that requires you to tense and release different muscles in their body and focus on their breathing. During session, lights are off and soft music is being played. Pts are given a stress ball to use if needed.   Goal Area(s) Addressed:  Patients will be able to describe progressive muscle relaxation.  Patient will practice using relaxation technique. Patient will identify a new coping skill.  Patient will follow multistep directions to reduce anxiety and stress.   Affect/Mood: N/A   Participation Level: Did not attend    Clinical Observations/Individualized Feedback: Patient did not attend group.   Plan: Continue to engage patient in RT group sessions 2-3x/week.   Deatrice Factor, LRT, CTRS 05/12/2023 1:53 PM

## 2023-05-12 NOTE — Group Note (Signed)
 Recreation Therapy Group Note   Group Topic:General Recreation  Group Date: 05/12/2023 Start Time: 1500 End Time: 1600 Facilitators: Deatrice Factor, LRT, CTRS Location: Courtyard  Group Description: Outdoor Recreation. Patients had the option to play corn hole, ring toss, bowling or listening to music while outside in the courtyard getting fresh air and sunlight. LRT and patients discussed things that they enjoy doing in their free time outside of the hospital. LRT encouraged patients to drink water after being active and getting their heart rate up.   Goal Area(s) Addressed: Patient will identify leisure interests.  Patient will practice healthy decision making. Patient will engage in recreation activity.   Affect/Mood: N/A   Participation Level: Did not attend    Clinical Observations/Individualized Feedback: Patient did not attend group.   Plan: Continue to engage patient in RT group sessions 2-3x/week.   Deatrice Factor, LRT, CTRS 05/12/2023 5:26 PM

## 2023-05-12 NOTE — Plan of Care (Signed)
 D: Pt alert and oriented. Pt reports experiencing anxiety/depression. Pt reports experiencing 10/10 back pain, scheduled cream applied. Pt denies experiencing any SI/HI however reports experiencing AVH. Pt reports it being a battle between Jehovah and a demon.   A: Scheduled medications administered to pt, per MD orders. Support and encouragement provided. Frequent verbal contact made. Routine safety checks conducted q15 minutes.   R: No adverse drug reactions noted. Pt verbally contracts for safety at this time. Pt compliant with medications and treatment plan. Pt interacts well with others on the unit. Pt remains safe at this time. Plan of care ongoing.  This afternoon pt did not receive diclofenac sodium topical gel d/t not being available. Pt was sleeping and unable to wake, MD notified and aware.  Problem: Education: Goal: Emotional status will improve Outcome: Not Progressing Goal: Mental status will improve Outcome: Not Progressing

## 2023-05-13 DIAGNOSIS — F200 Paranoid schizophrenia: Secondary | ICD-10-CM | POA: Diagnosis not present

## 2023-05-13 LAB — LIPID PANEL
Cholesterol: 124 mg/dL (ref 0–200)
HDL: 59 mg/dL (ref >40)
LDL Cholesterol: 52 mg/dL (ref 0–99)
Total CHOL/HDL Ratio: 2.1 ratio
Triglycerides: 64 mg/dL (ref <150)
VLDL: 13 mg/dL (ref 0–40)

## 2023-05-13 LAB — HEMOGLOBIN A1C
Hgb A1c MFr Bld: 5 % (ref 4.8–5.6)
Mean Plasma Glucose: 96.8 mg/dL

## 2023-05-13 MED ORDER — RISPERIDONE 1 MG PO TBDP
2.0000 mg | ORAL_TABLET | Freq: Every day | ORAL | Status: DC
  Administered 2023-05-13 – 2023-05-15 (×3): 2 mg via ORAL
  Filled 2023-05-13 (×3): qty 2

## 2023-05-13 NOTE — Group Note (Signed)
 Date:  05/13/2023 Time:  8:38 PM  Group Topic/Focus:  Making Healthy Choices:   The focus of this group is to help patients identify negative/unhealthy choices they were using prior to admission and identify positive/healthier coping strategies to replace them upon discharge.    Participation Level:  Active  Participation Quality:  Appropriate  Affect:  Appropriate  Cognitive:  Appropriate  Insight: Appropriate  Engagement in Group:  Engaged  Modes of Intervention:  Discussion  Additional Comments:    Lynette Saras 05/13/2023, 8:38 PM

## 2023-05-13 NOTE — Plan of Care (Signed)
  Problem: Education: Goal: Knowledge of [facility] Education information/materials will improve Outcome: Progressing Goal: Emotional status will improve Outcome: Progressing Goal: Mental status will improve Outcome: Progressing Goal: Verbalization of understanding the information provided will improve Outcome: Progressing  Patient complaining of anxiety this shift endorse A/VH and denies SI/HI and verbally contracts for safety. Compliant with scheduled mediations no adverse effects noted.

## 2023-05-13 NOTE — Plan of Care (Signed)
 D: Pt alert and oriented. Pt reports experiencing anxiety/depression at this time. Pt reports experiencing 7/10 back pain, prn medication given and cream applied. Pt denies experiencing any SI/HI however endorses AVH.   A: Scheduled medications administered to pt, per MD orders. Support and encouragement provided. Frequent verbal contact made. Routine safety checks conducted q15 minutes.   R: No adverse drug reactions noted. Pt verbally contracts for safety at this time. Pt compliant with medications and treatment plan. Pt interacts well with others on the unit. Pt remains safe at this time. Plan of care ongoing.  Pt did not receive 1400 Voltaren  cream d/t not being available, MD notified and aware.  Problem: Education: Goal: Emotional status will improve Outcome: Not Progressing Goal: Mental status will improve Outcome: Not Progressing

## 2023-05-13 NOTE — Progress Notes (Signed)
 [facility] MD Progress Note  05/13/2023 3:52 PM Zaniya Mcaulay  MRN:  [id number]  Vicki Thomas is a 63 y.o. female admitted: Presented to the EDfor 05/04/2023  5:23 PM for hallucinations, suicidal ideations. She carries the psychiatric diagnoses of schizophrenia, bipolar and has a past medical history of  UTI. Her current presentation of suicidal ideations, hallucinations, paranoia is most consistent with schizophrenia.Patient is admitted to [facility] unit with Q15 min safety monitoring. Multidisciplinary team approach is offered. Medication management; group/milieu therapy is offered.  Subjective:  Chart reviewed, case discussed in multidisciplinary meeting, patient seen during rounds.  Patient is noted to be sitting in the day area and talking to the peers.  She reports that her anxiety is still bothering her.  She talks about hallucinations keeping her up sometimes in the night but denies any command type hallucinations.  She reports generalized pain over her neck and back but denies any stiffness.  Patient is currently on Risperdal  0.5 mg every morning and 1 mg nightly-will need to monitor for any EPS.  Patient and family remains insistent and persistent about having LAI.  Discussed Risperdal  Consta that is available in the pharmacy.  Discussed the plan to increase her nighttime dose of Risperdal  to 2 mg.  Patient agreed with the plan.  She denies active SI today and denies HI.  Per nursing patient remains somatic and anxious throughout the day. patient's daughter [name] [phone]  Sleep: Fair  Appetite:  Fair  Past Psychiatric History: see h&P Family History:  Family History  Problem Relation Age of Onset   Alcohol abuse Mother    Bipolar disorder Father    Paranoid behavior Father    Alcohol abuse Father    Bipolar disorder Sister    Anxiety disorder Sister    Alcohol abuse Brother    Drug abuse Brother    Bipolar disorder Paternal Aunt    Schizophrenia Maternal Grandfather    Anxiety  disorder Maternal Aunt    Social History:  Social History   Substance and Sexual Activity  Alcohol Use No     Social History   Substance and Sexual Activity  Drug Use No    Social History   Socioeconomic History   Marital status: Single    Spouse name: Not on file   Number of children: Not on file   Years of education: Not on file   Highest education level: Not on file  Occupational History   Not on file  Tobacco Use   Smoking status: Former    Current packs/day: 1.00    Average packs/day: 1 pack/day for 6.0 years (6.0 ttl pk-yrs)    Types: Cigarettes   Smokeless tobacco: Never  Vaping Use   Vaping status: Never Used  Substance and Sexual Activity   Alcohol use: No   Drug use: No   Sexual activity: Not Currently  Other Topics Concern   Not on file  Social History Narrative   Not on file   Social Drivers of Health   Financial Resource Strain: Not on file  Food Insecurity: Patient Declined (05/05/2023)   Hunger Vital Sign    Worried About Running Out of Food in the Last Year: Patient declined    Ran Out of Food in the Last Year: Patient declined  Transportation Needs: No Transportation Needs (05/05/2023)   PRAPARE - Administrator, Civil Service (Medical): No    Lack of Transportation (Non-Medical): No  Physical Activity: Not on file  Stress: Not on  file  Social Connections: Unknown (05/30/2021)   Received from [facility]   Social Network    Social Network: Not on file   Past Medical History:  Past Medical History:  Diagnosis Date   Anxiety    Arthritis    Bipolar 1 disorder (HCC)    Chronic back pain    Chronic fatigue    Chronic headaches    Chronic pain    Fibromyalgia    Hiatal hernia    History of borderline personality disorder    Interstitial cystitis    Lymphedema    Panic disorder with agoraphobia    PTSD (post-traumatic stress disorder)     Past Surgical History:  Procedure Laterality Date   ABDOMINAL HYSTERECTOMY      APPENDECTOMY     BARIATRIC SURGERY     CHOLECYSTECTOMY     ESOPHAGUS SURGERY     FOOT SURGERY     HEMORRHOID SURGERY     HERNIA REPAIR     TONSILLECTOMY      Current Medications: Current Facility-Administered Medications  Medication Dose Route Frequency Provider Last Rate Last Admin   acetaminophen  (TYLENOL ) tablet 650 mg  650 mg Oral Q4H PRN Roise Cleaver, NP   650 mg at 05/13/23 0829   alum & mag hydroxide-simeth (MAALOX/MYLANTA) 200-200-20 MG/5ML suspension 30 mL  30 mL Oral Q4H PRN Roise Cleaver, NP   30 mL at 05/12/23 1211   clonazePAM  (KLONOPIN ) tablet 0.5 mg  0.5 mg Oral BID Meggie Laseter, MD   0.5 mg at 05/13/23 4098   clotrimazole  (GYNE-LOTRIMIN ) vaginal cream 1 Applicatorful  1 Applicatorful Vaginal QHS Aurelia Blotter, MD   1 Applicatorful at 05/12/23 2100   diclofenac  Sodium (VOLTAREN ) 1 % topical gel 2 g  2 g Topical QID Nyari Olsson, MD   2 g at 05/13/23 0912   hydrOXYzine  (ATARAX ) tablet 50 mg  50 mg Oral Q6H PRN Ambrea Hegler, MD   50 mg at 05/13/23 0830   magnesium  hydroxide (MILK OF MAGNESIA) suspension 30 mL  30 mL Oral Daily PRN Roise Cleaver, NP   30 mL at 05/13/23 1191   OLANZapine  (ZYPREXA ) injection 5 mg  5 mg Intramuscular TID PRN Roise Cleaver, NP   5 mg at 05/08/23 2224   OLANZapine  (ZYPREXA ) tablet 2.5 mg  2.5 mg Oral Q8H PRN Roise Cleaver, NP   2.5 mg at 05/08/23 0841   ondansetron  (ZOFRAN -ODT) disintegrating tablet 4 mg  4 mg Oral Q8H PRN Roise Cleaver, NP   4 mg at 05/13/23 0831   risperiDONE  (RISPERDAL  M-TABS) disintegrating tablet 0.5 mg  0.5 mg Oral Daily Sami Froh, MD   0.5 mg at 05/13/23 0830   risperiDONE  (RISPERDAL  M-TABS) disintegrating tablet 2 mg  2 mg Oral QHS Ashika Apuzzo, MD        Lab Results:  Results for orders placed or performed during the hospital encounter of 05/05/23 (from the past 48 hours)  Lipid panel     Status: None   Collection Time: 05/13/23  6:42 AM  Result Value Ref Range   Cholesterol 124 0  - 200 mg/dL   Triglycerides 64 <478 mg/dL   HDL 59 >29 mg/dL   Total CHOL/HDL Ratio 2.1 RATIO   VLDL 13 0 - 40 mg/dL   LDL Cholesterol 52 0 - 99 mg/dL    Comment:        Total Cholesterol/HDL:CHD Risk Coronary Heart Disease Risk Table  Men   Women  1/2 Average Risk   3.4   3.3  Average Risk       5.0   4.4  2 X Average Risk   9.6   7.1  3 X Average Risk  23.4   11.0        Use the calculated Patient Ratio above and the CHD Risk Table to determine the patient's CHD Risk.        ATP III CLASSIFICATION (LDL):  <100     mg/dL   Optimal  161-096  mg/dL   Near or Above                    Optimal  130-159  mg/dL   Borderline  045-409  mg/dL   High  >811     mg/dL   Very High Performed at [facility], [address] [place], [place] [address]   Hemoglobin A1c     Status: None   Collection Time: 05/13/23  6:42 AM  Result Value Ref Range   Hgb A1c MFr Bld 5.0 4.8 - 5.6 %    Comment: (NOTE) Pre diabetes:          5.7%-6.4%  Diabetes:              >6.4%  Glycemic control for   <7.0% adults with diabetes    Mean Plasma Glucose 96.8 mg/dL    Comment: Performed at [facility], [address], [place], [place] [address]    Blood Alcohol level:  Lab Results  Component Value Date   [facility] <10 05/04/2023   ETH <11 01/13/2014    Metabolic Disorder Labs: Lab Results  Component Value Date   HGBA1C 5.0 05/13/2023   MPG 96.8 05/13/2023   No results found for: "PROLACTIN" Lab Results  Component Value Date   CHOL 124 05/13/2023   TRIG 64 05/13/2023   HDL 59 05/13/2023   CHOLHDL 2.1 05/13/2023   VLDL 13 05/13/2023   LDLCALC 52 05/13/2023    Physical Findings: AIMS:  , ,  ,  ,    CIWA:    COWS:      Psychiatric Specialty Exam:  Presentation  General Appearance:  Casual; Appropriate for Environment  Eye Contact: Fair  Speech: Clear and Coherent  Speech Volume: Normal    Mood and Affect   Mood: Anxious  Affect: Appropriate   Thought Process  Thought Processes: Irrevelant  Descriptions of Associations:Intact  Orientation:Partial  Thought Content:Delusions; Paranoid Ideation  Hallucinations:Hallucinations: Auditory Description of Auditory Hallucinations: not command type, chronic, talks about magical things, pt is not repsonding to internal stimuli  Ideas of Reference:Paranoia  Suicidal Thoughts:Suicidal Thoughts: No  Homicidal Thoughts:Homicidal Thoughts: No   Sensorium  Memory: Immediate Fair; Recent Fair; Remote Poor  Judgment: Impaired  Insight: Shallow   Executive Functions  Concentration: Fair  Attention Span: Fair  Recall: Poor  Fund of Knowledge: Fair  Language: Fair   Psychomotor Activity  Psychomotor Activity: Psychomotor Activity: Normal  Musculoskeletal: Strength & Muscle Tone: decreased Gait & Station: unsteady Assets  Assets: Manufacturing systems engineer; Desire for Improvement; Resilience; Social Support    Physical Exam: Physical Exam Vitals and nursing note reviewed.  HENT:     Head: Normocephalic.     Right Ear: Tympanic membrane normal.  Cardiovascular:     Rate and Rhythm: Normal rate.     Pulses: Normal pulses.  Skin:    General: Skin is warm.  Neurological:  Mental Status: She is alert.    Review of Systems  Constitutional:  Positive for malaise/fatigue.  HENT: Negative.    Eyes: Negative.   Cardiovascular: Negative.   Skin: Negative.    Blood pressure 112/71, pulse 81, temperature (!) 97 F (36.1 C), resp. rate 17, height 5\' 7"  (1.702 m), weight 67.4 kg, SpO2 96%. Body mass index is 23.26 kg/m.  Diagnosis: Principal Problem:   Schizophrenia [facility])  Clinical Decision Making: Patient with reported history of schizophrenia, anxiety admitted for suicidal ideation and worsening anxiety with some paranoia.  Patient is unable to identify any triggers.  No history of substance use currently.   Patient has severe anxiety.  She needs inpatient hospitalization for further stabilization   Treatment Plan Summary:   Safety and Monitoring:             -- Voluntary admission to inpatient psychiatric unit for safety, stabilization and treatment             -- Daily contact with patient to assess and evaluate symptoms and progress in treatment             -- Patient's case to be discussed in multi-disciplinary team meeting             -- Observation Level: q15 minute checks             -- Vital signs:  q12 hours             -- Precautions: suicide, elopement, and assault   2. Psychiatric Diagnoses and Treatment:   Klonopin  0.5 twice daily given severe anxiety displayed by the patient Increased Risperdal  0.5 mg every morning and 2 mg nightly with the plan to use LAI Risperdal  Consta 25 mg Q2 weeks -dose to be given 05/15/23  Discontinued Zyprexa  7.5 nightly due to poor efficacy Risperdal  is not in the allergy section-  Per chart patient has documentation of multiple psychotropics having allergic reaction to.     -- The risks/benefits/side-effects/alternatives to this medication were discussed in detail with the patient and time was given for questions. The patient consents to medication trial.                -- Metabolic profile and EKG monitoring obtained while on an atypical antipsychotic (BMI: Lipid Panel: HbgA1c: QTc:)              -- Encouraged patient to participate in unit milieu and in scheduled group therapies                            3. Medical Issues Being Addressed:  No urgent medical needs identified UTI patient is currently prescribed antibiotics   4. Discharge Planning:              -- Social work and case management to assist with discharge planning and identification of hospital follow-up needs prior to discharge             -- Estimated LOS: 5-7 days             -- Discharge Concerns: Need to establish a safety plan; Medication compliance and effectiveness              -- Discharge Goals: Return home with outpatient referrals follow ups   Physician Treatment Plan for Primary Diagnosis: Schizophrenia (HCC) Long Term Goal(s): Improvement in symptoms so as ready for discharge   Short Term Goals: Ability to identify changes  in lifestyle to reduce recurrence of condition will improve, Ability to verbalize feelings will improve, Ability to disclose and discuss suicidal ideas, Ability to demonstrate self-control will improve, and Ability to identify and develop effective coping behaviors will improve   Physician Treatment Plan for Secondary Diagnosis: Principal Problem:   Schizophrenia (HCC)   Long Term Goal(s): Improvement in symptoms so as ready for discharge   Short Term Goals: Ability to identify changes in lifestyle to reduce recurrence of condition will improve, Ability to verbalize feelings will improve, Ability to disclose and discuss suicidal ideas, Ability to demonstrate self-control will improve, Ability to identify and develop effective coping behaviors will improve, Ability to maintain clinical measurements within normal limits will improve, and Compliance with prescribed medications will improve    Aurelia Blotter, MD 05/13/2023, 3:52 PM

## 2023-05-13 NOTE — Group Note (Signed)
 Therapy Group Note  Group Topic: Pain Coping/Management Strategies Group Date: 05/13/2023 Start Time: 1310 End Time: 1400 Facilitators: Hilma Lucks, OT     Group Description:  Group discussed impact of chronic/acute pain on safety and independence with functional tasks and impact on mental health.  Identified and discussed any previously learned or implemented strategies used.  Discussed and reviewed cognitive behavioral pain coping strategies to address/improve overall management of pain. Discussed relaxation, distraction techniques, cognitive restructuring, activity pacing/energy conservation, environment/home safety modifications, and role of sleep and sleep hygiene. Allowed time for questions and further discussion.      Therapeutic Goal(s):   Identify and discuss previously utilized pain coping strategies and implications of pain on function/well-being Identify and discuss implementing new cognitive behavioral pain coping strategies into daily routines Demonstrate understanding and performance of learned cognitive behavioral pain coping strategies.    Individual Participation:  Pt did not attend.     Participation Level: Did not attend   Participation Quality:   Behavior:   Speech/Thought Process:   Affect/Mood:   Insight:   Judgement:   Individualization:   Modes of Intervention:   Patient Response to Interventions:    Plan: Continue to engage patient in OT groups 1-2x/week.   Criss Pallone R., MPH, MS, OTR/L ascom [phone] 05/13/23, 2:34 PM

## 2023-05-13 NOTE — Group Note (Signed)
 Recreation Therapy Group Note   Group Topic:Communication  Group Date: 05/13/2023 Start Time: 1500 End Time: 1600 Facilitators: Deatrice Factor, LRT, CTRS Location: Courtyard  Group Description: Emotional Check in. Patient sat and talked with LRT about how they are doing and whatever else is on their mind. LRT provided active listening, reassurance and encouragement. Pts were given the opportunity to listen to music or color mandalas while they talk.    Goal Area(s) Addressed: Patient will engage in conversation with LRT. Patient will communicate their wants, needs, or questions.  Patient will practice a new coping skill of "talking to someone".   Affect/Mood: Appropriate   Participation Level: Active and Engaged   Participation Quality: Independent   Behavior: Appropriate, Calm, and Cooperative   Speech/Thought Process: Coherent   Insight: Good   Judgement: Good   Modes of Intervention: Music and Open Conversation   Patient Response to Interventions:  Attentive, Engaged, Interested , and Receptive   Education Outcome:  Acknowledges education   Clinical Observations/Individualized Feedback: Vicki Thomas was active in their participation of session activities and group discussion. Pt assisted LRT and peers with watering the flowers in the courtyard. Pt became emotional at one point and said she was "overwhelmed with happiness" and that this was very enjoyable for her. Pt was noted to be smiling, talking with LRT and peers, and singing along to the music being played.    Plan: Continue to engage patient in RT group sessions 2-3x/week.   Deatrice Factor, LRT, CTRS 05/13/2023 5:11 PM

## 2023-05-13 NOTE — Group Note (Signed)
 Recreation Therapy Group Note   Group Topic:Other  Group Date: 05/13/2023 Start Time: 1100 End Time: 1145 Facilitators: Deatrice Factor, LRT, CTRS Location:  Dayroom  Activity Description/Intervention: Therapeutic Drumming. Patients with peers and staff were given the opportunity to engage in a leader facilitated [employer] Empowerment Drumming Circle with staff from the [employer], in partnership with [employer]. Teaching laboratory technician and trained [employer], Kathlyne Parchment leading with LRT observing and documenting intervention and pt response. This evidenced-based practice targets 7 areas of health and wellbeing in the human experience including: stress-reduction, exercise, self-expression, camaraderie/support, nurturing, spirituality, and music-making (leisure).    Goal Area(s) Addresses:  Patient will engage in pro-social way in music group.  Patient will follow directions of drum leader on the first prompt. Patient will demonstrate no behavioral issues during group.  Patient will identify if a reduction in stress level occurs as a result of participation in therapeutic drum circle.     Affect/Mood: N/A   Participation Level: Did not attend    Clinical Observations/Individualized Feedback: Patient did not attend group.   Plan: Continue to engage patient in RT group sessions 2-3x/week.   [address], LRT, CTRS 05/13/2023 2:12 PM

## 2023-05-14 MED ORDER — ENSURE ENLIVE PO LIQD
237.0000 mL | Freq: Two times a day (BID) | ORAL | Status: DC
  Administered 2023-05-15 – 2023-05-17 (×6): 237 mL via ORAL

## 2023-05-14 NOTE — Progress Notes (Signed)
 [facility] MD Progress Note  05/14/2023 6:39 PM Vicki Thomas  MRN:  [id number]  Vicki Thomas is a 63 y.o. female admitted: Presented to the EDfor 05/04/2023  5:23 PM for hallucinations, suicidal ideations. She carries the psychiatric diagnoses of schizophrenia, bipolar and has a past medical history of  UTI. Her current presentation of suicidal ideations, hallucinations, paranoia is most consistent with schizophrenia.Patient is admitted to [facility] unit with Q15 min safety monitoring. Multidisciplinary team approach is offered. Medication management; group/milieu therapy is offered.  Subjective:  Chart reviewed, case discussed in multidisciplinary meeting, patient seen during rounds.   Patient reportedly has spent most of her life in her home for the last 8 years she did not come out she has significant agoraphobia usually at home she reportedly walks with a walker but here she has been mostly in the wheelchair she talks about hearing things and whispers and whispers are not that intense now.  No side effects reported today but previously reported some EPS symptoms no overt EPS symptoms noted as such. patient's daughter Vicki Thomas [phone]  Sleep: Fair  Appetite:  Fair  Past Psychiatric History: see h&P Family History:  Family History  Problem Relation Age of Onset   Alcohol abuse Mother    Bipolar disorder Father    Paranoid behavior Father    Alcohol abuse Father    Bipolar disorder Sister    Anxiety disorder Sister    Alcohol abuse Brother    Drug abuse Brother    Bipolar disorder Paternal Aunt    Schizophrenia Maternal Grandfather    Anxiety disorder Maternal Aunt    Social History:  Social History   Substance and Sexual Activity  Alcohol Use No     Social History   Substance and Sexual Activity  Drug Use No    Social History   Socioeconomic History   Marital status: Single    Spouse name: Not on file   Number of children: Not on file   Years of education: Not on file    Highest education level: Not on file  Occupational History   Not on file  Tobacco Use   Smoking status: Former    Current packs/day: 1.00    Average packs/day: 1 pack/day for 6.0 years (6.0 ttl pk-yrs)    Types: Cigarettes   Smokeless tobacco: Never  Vaping Use   Vaping status: Never Used  Substance and Sexual Activity   Alcohol use: No   Drug use: No   Sexual activity: Not Currently  Other Topics Concern   Not on file  Social History Narrative   Not on file   Social Drivers of Health   Financial Resource Strain: Not on file  Food Insecurity: Patient Declined (05/05/2023)   Hunger Vital Sign    Worried About Running Out of Food in the Last Year: Patient declined    Ran Out of Food in the Last Year: Patient declined  Transportation Needs: No Transportation Needs (05/05/2023)   PRAPARE - Administrator, Civil Service (Medical): No    Lack of Transportation (Non-Medical): No  Physical Activity: Not on file  Stress: Not on file  Social Connections: Unknown (05/30/2021)   Received from [facility]   Social Network    Social Network: Not on file   Past Medical History:  Past Medical History:  Diagnosis Date   Anxiety    Arthritis    Bipolar 1 disorder (HCC)    Chronic back pain    Chronic fatigue  Chronic headaches    Chronic pain    Fibromyalgia    Hiatal hernia    History of borderline personality disorder    Interstitial cystitis    Lymphedema    Panic disorder with agoraphobia    PTSD (post-traumatic stress disorder)     Past Surgical History:  Procedure Laterality Date   ABDOMINAL HYSTERECTOMY     APPENDECTOMY     BARIATRIC SURGERY     CHOLECYSTECTOMY     ESOPHAGUS SURGERY     FOOT SURGERY     HEMORRHOID SURGERY     HERNIA REPAIR     TONSILLECTOMY      Current Medications: Current Facility-Administered Medications  Medication Dose Route Frequency Provider Last Rate Last Admin   acetaminophen  (TYLENOL ) tablet 650 mg  650 mg Oral Q4H  PRN Roise Cleaver, NP   650 mg at 05/14/23 0949   alum & mag hydroxide-simeth (MAALOX/MYLANTA) 200-200-20 MG/5ML suspension 30 mL  30 mL Oral Q4H PRN Roise Cleaver, NP   30 mL at 05/12/23 1211   clonazePAM  (KLONOPIN ) tablet 0.5 mg  0.5 mg Oral BID Jadapalle, Sree, MD   0.5 mg at 05/14/23 1610   clotrimazole  (GYNE-LOTRIMIN ) vaginal cream 1 Applicatorful  1 Applicatorful Vaginal QHS Aurelia Blotter, MD   1 Applicatorful at 05/13/23 2119   diclofenac  Sodium (VOLTAREN ) 1 % topical gel 2 g  2 g Topical QID Jadapalle, Sree, MD   2 g at 05/14/23 1711   hydrOXYzine  (ATARAX ) tablet 50 mg  50 mg Oral Q6H PRN Jadapalle, Sree, MD   50 mg at 05/14/23 0940   magnesium  hydroxide (MILK OF MAGNESIA) suspension 30 mL  30 mL Oral Daily PRN Roise Cleaver, NP   30 mL at 05/13/23 9604   OLANZapine  (ZYPREXA ) injection 5 mg  5 mg Intramuscular TID PRN Roise Cleaver, NP   5 mg at 05/08/23 2224   OLANZapine  (ZYPREXA ) tablet 2.5 mg  2.5 mg Oral Q8H PRN Roise Cleaver, NP   2.5 mg at 05/08/23 0841   ondansetron  (ZOFRAN -ODT) disintegrating tablet 4 mg  4 mg Oral Q8H PRN Roise Cleaver, NP   4 mg at 05/14/23 1520   risperiDONE  (RISPERDAL  M-TABS) disintegrating tablet 0.5 mg  0.5 mg Oral Daily Jadapalle, Sree, MD   0.5 mg at 05/14/23 5409   risperiDONE  (RISPERDAL  M-TABS) disintegrating tablet 2 mg  2 mg Oral QHS Jadapalle, Sree, MD   2 mg at 05/13/23 2117    Lab Results:  Results for orders placed or performed during the hospital encounter of 05/05/23 (from the past 48 hours)  Lipid panel     Status: None   Collection Time: 05/13/23  6:42 AM  Result Value Ref Range   Cholesterol 124 0 - 200 mg/dL   Triglycerides 64 <811 mg/dL   HDL 59 >91 mg/dL   Total CHOL/HDL Ratio 2.1 RATIO   VLDL 13 0 - 40 mg/dL   LDL Cholesterol 52 0 - 99 mg/dL    Comment:        Total Cholesterol/HDL:CHD Risk Coronary Heart Disease Risk Table                     Men   Women  1/2 Average Risk   3.4   3.3  Average Risk       5.0    4.4  2 X Average Risk   9.6   7.1  3 X Average Risk  23.4   11.0  Use the calculated Patient Ratio above and the CHD Risk Table to determine the patient's CHD Risk.        ATP III CLASSIFICATION (LDL):  <100     mg/dL   Optimal  213-086  mg/dL   Near or Above                    Optimal  130-159  mg/dL   Borderline  578-469  mg/dL   High  >629     mg/dL   Very High Performed at [facility], [address] [place], [place] [address]   Hemoglobin A1c     Status: None   Collection Time: 05/13/23  6:42 AM  Result Value Ref Range   Hgb A1c MFr Bld 5.0 4.8 - 5.6 %    Comment: (NOTE) Pre diabetes:          5.7%-6.4%  Diabetes:              >6.4%  Glycemic control for   <7.0% adults with diabetes    Mean Plasma Glucose 96.8 mg/dL    Comment: Performed at [facility], [address], [place], [place] [address]    Blood Alcohol level:  Lab Results  Component Value Date   [facility] <10 05/04/2023   ETH <11 01/13/2014    Metabolic Disorder Labs: Lab Results  Component Value Date   HGBA1C 5.0 05/13/2023   MPG 96.8 05/13/2023   No results found for: "PROLACTIN" Lab Results  Component Value Date   CHOL 124 05/13/2023   TRIG 64 05/13/2023   HDL 59 05/13/2023   CHOLHDL 2.1 05/13/2023   VLDL 13 05/13/2023   LDLCALC 52 05/13/2023    Physical Findings: AIMS:  , ,  ,  ,    CIWA:    COWS:      Psychiatric Specialty Exam:  Presentation  General Appearance:  Casual; Appropriate for Environment  Eye Contact: Fair  Speech: Clear and Coherent  Speech Volume: Normal    Mood and Affect  Mood: Anxious  Affect: Appropriate   Thought Process  Thought Processes: Irrevelant  Descriptions of Associations:Intact  Orientation:Partial  Thought Content:Delusions; Paranoid Ideation  Hallucinations:Hallucinations: Auditory  Ideas of Reference:Paranoia  Suicidal Thoughts:Suicidal Thoughts: No  Homicidal Thoughts:Homicidal Thoughts:  No   Sensorium  Memory: Immediate Fair; Recent Fair; Remote Poor  Judgment: Impaired  Insight: Shallow   Executive Functions  Concentration: Fair  Attention Span: Fair  Recall: Poor  Fund of Knowledge: Fair  Language: Fair   Psychomotor Activity  Psychomotor Activity: Psychomotor Activity: Normal  Musculoskeletal: Strength & Muscle Tone: decreased Gait & Station: unsteady Assets  Assets: Manufacturing systems engineer; Desire for Improvement; Resilience; Social Support    Physical Exam:   Vitals are normal Review of Systems  Constitutional:  Positive for malaise/fatigue.  HENT: Negative.    Eyes: Negative.   Cardiovascular: Negative.   Skin: Negative.    Blood pressure (!) 117/56, pulse 82, temperature (!) 97.4 F (36.3 C), resp. rate 16, height 5\' 7"  (1.702 m), weight 67.4 kg, SpO2 96%. Body mass index is 23.26 kg/m.  Diagnosis: Principal Problem:   Schizophrenia [facility])  Clinical Decision Making: Patient with reported history of schizophrenia, anxiety admitted for suicidal ideation and worsening anxiety with some paranoia.  Patient is unable to identify any triggers.  No history of substance use currently.  Patient has severe anxiety.  She needs inpatient hospitalization for further stabilization   Treatment Plan Summary:   Safety  and Monitoring:             -- Voluntary admission to inpatient psychiatric unit for safety, stabilization and treatment             -- Daily contact with patient to assess and evaluate symptoms and progress in treatment             -- Patient's case to be discussed in multi-disciplinary team meeting             -- Observation Level: q15 minute checks             -- Vital signs:  q12 hours             -- Precautions: suicide, elopement, and assault   2. Psychiatric Diagnoses and Treatment:   Klonopin  0.5 twice daily given severe anxiety displayed by the patient  continue Risperdal  0.5 mg every morning and 2 mg nightly with the  plan to use LAI Risperdal  Consta 25 mg Q2 weeks -dose to be given 05/15/23     Per chart patient has documentation of multiple psychotropics having allergic reaction to.     -- The risks/benefits/side-effects/alternatives to this medication were discussed in detail with the patient and time was given for questions. The patient consents to medication trial.                -- Metabolic profile and EKG monitoring obtained while on an atypical antipsychotic (BMI: Lipid Panel: HbgA1c: QTc:)              -- Encouraged patient to participate in unit milieu and in scheduled group therapies                            3. Medical Issues Being Addressed:  No urgent medical needs identified UTI patient is currently prescribed antibiotics   4. Discharge Planning:              -- Social work and case management to assist with discharge planning and identification of hospital follow-up needs prior to discharge             -- Estimated LOS: 5-7 days             -- Discharge Concerns: Need to establish a safety plan; Medication compliance and effectiveness             -- Discharge Goals: Return home with outpatient referrals follow ups   Physician Treatment Plan for Primary Diagnosis: Schizophrenia (HCC) Long Term Goal(s): Improvement in symptoms so as ready for discharge   Short Term Goals: Ability to identify changes in lifestyle to reduce recurrence of condition will improve, Ability to verbalize feelings will improve, Ability to disclose and discuss suicidal ideas, Ability to demonstrate self-control will improve, and Ability to identify and develop effective coping behaviors will improve   Physician Treatment Plan for Secondary Diagnosis: Principal Problem:   Schizophrenia (HCC)   Long Term Goal(s): Improvement in symptoms so as ready for discharge   Short Term Goals: Ability to identify changes in lifestyle to reduce recurrence of condition will improve, Ability to verbalize feelings will  improve, Ability to disclose and discuss suicidal ideas, Ability to demonstrate self-control will improve, Ability to identify and develop effective coping behaviors will improve, Ability to maintain clinical measurements within normal limits will improve, and Compliance with prescribed medications will improve    Vicki Thomas R Mahima Hottle, MD 05/14/2023, 6:39 PM

## 2023-05-14 NOTE — Progress Notes (Signed)
   05/14/23 0950  Psych Admission Type (Psych Patients Only)  Admission Status Voluntary  Psychosocial Assessment  Patient Complaints Anxiety;Depression  Eye Contact Fair  Facial Expression Flat  Affect Anxious  Speech Soft  Interaction Needy  Motor Activity Slow  Appearance/Hygiene Unremarkable  Behavior Characteristics Cooperative  Mood Preoccupied;Anxious  Thought Process  Coherency Circumstantial  Content Preoccupation  Delusions None reported or observed  Perception Hallucinations  Hallucination Auditory  Judgment Impaired  Confusion Mild  Danger to Self  Current suicidal ideation? Denies  Agreement Not to Harm Self Yes  Description of Agreement verbal  Danger to Others  Danger to Others None reported or observed   D: Patient is alert and oriented. Reports experiencing anxiety and depression at the time of assessment, as well as shoulder pain 5/10, however, denies any suicidal or homicidal ideation, or auditory or visual hallucinations. Patient consumed more than 50% of her meals. Patient spent > 50% the shift in bedroom, lying in bed without signs of distress.  A: Scheduled medications administered per provider orders. Tylenol  650 mg po prn, Hydroxyzine  25 mg po prn, and Zofran  4 mg po prn offered for c/o shoulder pain, anxiety, and nausea, respectively. Support and encouragement provided, with frequent verbal contact. Routine safety checks conducted every 15 minutes.  R: No adverse drug reactions were observed. Patient reported some relief from offered prns. Patient is agreeable to notifying staff of any safety concerns and remains compliant with medications. Interact minimally with others on the unit, remains safe at this time. Plan of care is ongoing.

## 2023-05-14 NOTE — BH Assessment (Signed)
 Per consultation with the pharmacist, despite the system-generated warnings regarding iodine and Seroquel interactions when attempting to order Ensure, the pharmacist has reviewed the formulation and confirmed that it is safe to proceed. The pharmacist states that Ensure does not pose a contraindication in this context and may be safely ordered for the patient.

## 2023-05-14 NOTE — Group Note (Signed)
 Date:  05/14/2023 Time:  10:10 PM  Group Topic/Focus:  Healthy Communication:   The focus of this group is to discuss communication, barriers to communication, as well as healthy ways to communicate with others.    Participation Level:  Active  Participation Quality:  Appropriate  Affect:  Appropriate  Cognitive:  Appropriate  Insight: Appropriate  Engagement in Group:  Engaged  Modes of Intervention:  Discussion  Additional Comments:    Lynette Saras 05/14/2023, 10:10 PM

## 2023-05-15 NOTE — Plan of Care (Signed)
 Tametria Aho is a 63 y.o. female patient. No diagnosis found. Past Medical History:  Diagnosis Date   Anxiety    Arthritis    Bipolar 1 disorder (HCC)    Chronic back pain    Chronic fatigue    Chronic headaches    Chronic pain    Fibromyalgia    Hiatal hernia    History of borderline personality disorder    Interstitial cystitis    Lymphedema    Panic disorder with agoraphobia    PTSD (post-traumatic stress disorder)    Current Facility-Administered Medications  Medication Dose Route Frequency Provider Last Rate Last Admin   acetaminophen  (TYLENOL ) tablet 650 mg  650 mg Oral Q4H PRN Roise Cleaver, NP   650 mg at 05/14/23 0949   alum & mag hydroxide-simeth (MAALOX/MYLANTA) 200-200-20 MG/5ML suspension 30 mL  30 mL Oral Q4H PRN Roise Cleaver, NP   30 mL at 05/12/23 1211   clonazePAM  (KLONOPIN ) tablet 0.5 mg  0.5 mg Oral BID Jadapalle, Sree, MD   0.5 mg at 05/14/23 2111   clotrimazole  (GYNE-LOTRIMIN ) vaginal cream 1 Applicatorful  1 Applicatorful Vaginal QHS Aurelia Blotter, MD   1 Applicatorful at 05/14/23 2111   diclofenac  Sodium (VOLTAREN ) 1 % topical gel 2 g  2 g Topical QID Jadapalle, Sree, MD   2 g at 05/14/23 2111   feeding supplement (ENSURE ENLIVE / ENSURE PLUS) liquid 237 mL  237 mL Oral BID BM Dixon, Rashaun M, NP       hydrOXYzine  (ATARAX ) tablet 50 mg  50 mg Oral Q6H PRN Jadapalle, Sree, MD   50 mg at 05/14/23 2250   magnesium  hydroxide (MILK OF MAGNESIA) suspension 30 mL  30 mL Oral Daily PRN Roise Cleaver, NP   30 mL at 05/13/23 1610   OLANZapine  (ZYPREXA ) injection 5 mg  5 mg Intramuscular TID PRN Roise Cleaver, NP   5 mg at 05/08/23 2224   OLANZapine  (ZYPREXA ) tablet 2.5 mg  2.5 mg Oral Q8H PRN Roise Cleaver, NP   2.5 mg at 05/08/23 0841   ondansetron  (ZOFRAN -ODT) disintegrating tablet 4 mg  4 mg Oral Q8H PRN Roise Cleaver, NP   4 mg at 05/14/23 1520   risperiDONE  (RISPERDAL  M-TABS) disintegrating tablet 0.5 mg  0.5 mg Oral Daily Jadapalle, Sree, MD    0.5 mg at 05/14/23 9604   risperiDONE  (RISPERDAL  M-TABS) disintegrating tablet 2 mg  2 mg Oral QHS Jadapalle, Sree, MD   2 mg at 05/14/23 2110   Allergies  Allergen Reactions   Bee Venom Swelling    Requires Epipen   Brexpiprazole  Other (See Comments)    Severe head pain   Erythromycin Shortness Of Breath    Throat closes, FLUSHING    Penicillins Shortness Of Breath    Throat closes, FLUSHING   Fluoxetine Hcl     Aggression   Haloperidol Decanoate     Facial mask symptoms   Lamotrigine     Vertigo Increased falls   Lurasidone Hcl     Feels like her head is going to explode Dizziness   Quetiapine Fumarate Swelling   Abilify [Aripiprazole]    Aripiprazole    Ciprocin-Fluocin-Procin [Fluocinolone Acetonide]    Fexofenadine Hcl    Gabapentin     Dizziness and excessive falling   Iodine    Lithium     Marcaine [Bupivacaine Hcl]    Paroxetine Hcl    Pregabalin    Remeron [Mirtazapine]    Seroquel [Quetiapine Fumarate]    Tape  Zofran  Nausea Only   Zonisamide    Principal Problem:   Schizophrenia (HCC)  Blood pressure 127/71, pulse 87, temperature 97.8 F (36.6 C), temperature source Oral, resp. rate 16, height 5\' 7"  (1.702 m), weight 67.4 kg, SpO2 98%.  Pt. Took meds. Denies SI and HI. Requested Ensure and it was ordered by NP. Starts 05/15/23 @ 1000.  Raechal Raben B Anubis Fundora 05/15/2023

## 2023-05-15 NOTE — Progress Notes (Signed)
   05/15/23 0907  Psych Admission Type (Psych Patients Only)  Admission Status Voluntary  Psychosocial Assessment  Patient Complaints Anxiety;Depression  Eye Contact Fair  Facial Expression Flat  Affect Anxious  Speech Soft  Interaction Needy  Motor Activity Slow  Appearance/Hygiene Unremarkable  Behavior Characteristics Cooperative  Mood Anxious;Fearful  Thought Process  Coherency Circumstantial  Content Preoccupation  Delusions None reported or observed  Perception  (Denies)  Hallucination None reported or observed  Judgment Impaired  Confusion Mild  Danger to Self  Current suicidal ideation? Denies  Agreement Not to Harm Self Yes  Description of Agreement verbal  Danger to Others  Danger to Others None reported or observed   D: The patient is alert and oriented. She reports experiencing anxiety/depression at the time of assessment, as well as any pain. She denies any suicidal or homicidal ideation, auditory or visual hallucinations. Patient consumed more than 50% of each meal. She spent about 50% of the shift in her bedroom, lying/sitting in bed without signs of distress.  A: Scheduled medications were administered per provider orders. Tylenol  650 mg po prn, Hydroxyzine  50 mg po prn, and Maalox 30 ml po prn offered for c/o R shoulder pain, anxiety, and indigestion respectively. Support and encouragement were provided, with frequent verbal contact. Routine safety checks conducted every 15 minutes.  R: No adverse drug reactions were observed. Patient reported some relief from offered prns. Patient is agreeable to notifying staff of any safety concerns, remains compliant with medications. Patient interacts minimally with others on the unit, remains safe at this time. Plan of care is ongoing.

## 2023-05-15 NOTE — Progress Notes (Signed)
 Physical Therapy Treatment Patient Details Name: Vicki Thomas MRN: [id number] DOB: [birth year] Today's Date: 05/15/2023   History of Present Illness Pt is a 63 yo female that presented to ED for hallucinations, SI, anxiety. PMH of bipolar, schizophrenia, chronic back pain, panic disorder with agoraphobia, PTSD, personality disorder, anxiety, fibromyalgia, recent UTI diagnosis.    PT Comments  Pt in wheelchair, independent mobility on unit.  She does need continued cues to lock brakes during session as she does try to sit/stand without locking them.  Education provided.  She does ask me to come to her room and close the door where she lifts her shirt to show me how "emaciated" she is.  Rubs her stomach and upper rib cage and does seem a bit focused on it.  She is able to walk 200' in hallways pushing wheelchair to simulate her rollator at home.  Stated it is in good condition with good brakes and encouraged brake awareness for safety with rollator at well.  Voices understanding.  Declined further activity at this time.    If plan is discharge home, recommend the following: Assist for transportation;Help with stairs or ramp for entrance;Assistance with cooking/housework   Can travel by private vehicle        Equipment Recommendations  None recommended by PT    Recommendations for Other Services       Precautions / Restrictions Precautions Precautions: Fall Recall of Precautions/Restrictions: Intact Restrictions Weight Bearing Restrictions Per Provider Order: No     Mobility  Bed Mobility Overal bed mobility: Modified Independent                  Transfers Overall transfer level: Needs assistance Equipment used: Pushed w/c Transfers: Sit to/from Stand Sit to Stand: Supervision           General transfer comment: cues to lock brakes or wheelchair x 3 during session    Ambulation/Gait Ambulation/Gait assistance: Supervision Gait Distance (Feet): 200 Feet Assistive  device: Pushed wheelchair Gait Pattern/deviations: Step-through pattern           Stairs             Wheelchair Mobility     Tilt Bed    Modified Rankin (Stroke Patients Only)       Balance Overall balance assessment: Mild deficits observed, not formally tested   Sitting balance-Leahy Scale: Normal     Standing balance support: Bilateral upper extremity supported Standing balance-Leahy Scale: Good                              Communication Communication Communication: No apparent difficulties  Cognition Arousal: Alert Behavior During Therapy: WFL for tasks assessed/performed                             Following commands: Intact      Cueing    Exercises      General Comments        Pertinent Vitals/Pain Pain Assessment Pain Assessment: No/denies pain    Home Living                          Prior Function            PT Goals (current goals can now be found in the care plan section) Progress towards PT goals: Progressing toward goals    Frequency  Min 1X/week      PT Plan      Co-evaluation              AM-PAC PT "6 Clicks" Mobility   Outcome Measure  Help needed turning from your back to your side while in a flat bed without using bedrails?: None Help needed moving from lying on your back to sitting on the side of a flat bed without using bedrails?: None Help needed moving to and from a bed to a chair (including a wheelchair)?: None Help needed standing up from a chair using your arms (e.g., wheelchair or bedside chair)?: None Help needed to walk in hospital room?: None Help needed climbing 3-5 steps with a railing? : A Little 6 Click Score: 23    End of Session Equipment Utilized During Treatment: Gait belt Activity Tolerance: Patient tolerated treatment well Patient left: in bed   PT Visit Diagnosis: Other abnormalities of gait and mobility (R26.89);Difficulty in walking, not  elsewhere classified (R26.2);Muscle weakness (generalized) (M62.81)     Time: 4696-2952 PT Time Calculation (min) (ACUTE ONLY): 11 min  Charges:    $Gait Training: 8-22 mins PT General Charges $$ ACUTE PT VISIT: 1 Visit                   Charlanne Cong, PTA 05/15/23, 1:15 PM

## 2023-05-15 NOTE — Group Note (Signed)
 Date:  05/15/2023 Time:  3:16 PM  Group Topic/Focus:  Healthy Communication:   The focus of this group is to discuss communication, barriers to communication, as well as healthy ways to communicate with others. Making Healthy Choices:   The focus of this group is to help patients identify negative/unhealthy choices they were using prior to admission and identify positive/healthier coping strategies to replace them upon discharge.    Participation Level:  Did Not Attend  Participation Quality:      Affect:      Cognitive:      Insight: None  Engagement in Group:      Modes of Intervention:      Additional Comments:    [name] 05/15/2023, 3:16 PM

## 2023-05-15 NOTE — Plan of Care (Signed)
  Problem: Health Behavior/Discharge Planning: Goal: Compliance with treatment plan for underlying cause of condition will improve Outcome: Progressing   Problem: Physical Regulation: Goal: Ability to maintain clinical measurements within normal limits will improve Outcome: Progressing   Problem: Safety: Goal: Periods of time without injury will increase Outcome: Progressing   

## 2023-05-15 NOTE — Group Note (Signed)
 Date:  05/15/2023 Time:  8:42 PM  Group Topic/Focus:  Self Care:   The focus of this group is to help patients understand the importance of self-care in order to improve or restore emotional, physical, spiritual, interpersonal, and financial health.    Participation Level:  Active  Participation Quality:  Appropriate  Affect:  Appropriate  Cognitive:  Appropriate  Insight: Appropriate  Engagement in Group:  Engaged  Modes of Intervention:  Discussion  Additional Comments:    Sherlie Distance 05/15/2023, 8:42 PM

## 2023-05-15 NOTE — Progress Notes (Signed)
 [facility] MD Progress Note  05/15/2023 6:01 AM Vicki Thomas  MRN:  [id number]   Patient continues to be confused minimal sleep anxious still reports voices where she says that "new system is coming" is on wheelchair, patient is complaining about anxiety, sometimes urinary retention,  Vicki Thomas is a 63 y.o. female admitted: Presented to the EDfor 05/04/2023  5:23 PM for hallucinations, suicidal ideations. She carries the psychiatric diagnoses of schizophrenia, bipolar and has a past medical history of  UTI. Her current presentation of suicidal ideations, hallucinations, paranoia is most consistent with schizophrenia.Patient is admitted to [facility] unit with Q15 min safety monitoring. Multidisciplinary team approach is offered. Medication management; group/milieu therapy is offered.  Subjective:  Chart reviewed, case discussed in multidisciplinary meeting, patient seen during rounds.   Patient reportedly has spent most of her life in her home for the last 8 years she did not come out she has significant agoraphobia usually at home she reportedly walks with a walker but here she has been mostly in the wheelchair she talks about hearing things and whispers and whispers are not that intense now.  No side effects reported today but previously reported some EPS symptoms no overt EPS symptoms noted as such. patient's daughter [name] [phone]  Sleep: Fair  Appetite:  Fair  Past Psychiatric History: see h&P Family History:  Family History  Problem Relation Age of Onset   Alcohol abuse Mother    Bipolar disorder Father    Paranoid behavior Father    Alcohol abuse Father    Bipolar disorder Sister    Anxiety disorder Sister    Alcohol abuse Brother    Drug abuse Brother    Bipolar disorder Paternal Aunt    Schizophrenia Maternal Grandfather    Anxiety disorder Maternal Aunt    Social History:  Social History   Substance and Sexual Activity  Alcohol Use No     Social History    Substance and Sexual Activity  Drug Use No    Social History   Socioeconomic History   Marital status: Single    Spouse name: Not on file   Number of children: Not on file   Years of education: Not on file   Highest education level: Not on file  Occupational History   Not on file  Tobacco Use   Smoking status: Former    Current packs/day: 1.00    Average packs/day: 1 pack/day for 6.0 years (6.0 ttl pk-yrs)    Types: Cigarettes   Smokeless tobacco: Never  Vaping Use   Vaping status: Never Used  Substance and Sexual Activity   Alcohol use: No   Drug use: No   Sexual activity: Not Currently  Other Topics Concern   Not on file  Social History Narrative   Not on file   Social Drivers of Health   Financial Resource Strain: Not on file  Food Insecurity: Patient Declined (05/05/2023)   Hunger Vital Sign    Worried About Running Out of Food in the Last Year: Patient declined    Ran Out of Food in the Last Year: Patient declined  Transportation Needs: No Transportation Needs (05/05/2023)   PRAPARE - Administrator, Civil Service (Medical): No    Lack of Transportation (Non-Medical): No  Physical Activity: Not on file  Stress: Not on file  Social Connections: Unknown (05/30/2021)   Received from [facility]   Social Network    Social Network: Not on file   Past Medical History:  Past Medical History:  Diagnosis Date   Anxiety    Arthritis    Bipolar 1 disorder (HCC)    Chronic back pain    Chronic fatigue    Chronic headaches    Chronic pain    Fibromyalgia    Hiatal hernia    History of borderline personality disorder    Interstitial cystitis    Lymphedema    Panic disorder with agoraphobia    PTSD (post-traumatic stress disorder)     Past Surgical History:  Procedure Laterality Date   ABDOMINAL HYSTERECTOMY     APPENDECTOMY     BARIATRIC SURGERY     CHOLECYSTECTOMY     ESOPHAGUS SURGERY     FOOT SURGERY     HEMORRHOID SURGERY     HERNIA  REPAIR     TONSILLECTOMY      Current Medications: Current Facility-Administered Medications  Medication Dose Route Frequency Provider Last Rate Last Admin   acetaminophen  (TYLENOL ) tablet 650 mg  650 mg Oral Q4H PRN Roise Cleaver, NP   650 mg at 05/14/23 0949   alum & mag hydroxide-simeth (MAALOX/MYLANTA) 200-200-20 MG/5ML suspension 30 mL  30 mL Oral Q4H PRN Roise Cleaver, NP   30 mL at 05/12/23 1211   clonazePAM  (KLONOPIN ) tablet 0.5 mg  0.5 mg Oral BID Jadapalle, Sree, MD   0.5 mg at 05/14/23 2111   clotrimazole  (GYNE-LOTRIMIN ) vaginal cream 1 Applicatorful  1 Applicatorful Vaginal QHS Aurelia Blotter, MD   1 Applicatorful at 05/14/23 2111   diclofenac  Sodium (VOLTAREN ) 1 % topical gel 2 g  2 g Topical QID Aurelia Blotter, MD   2 g at 05/14/23 2111   feeding supplement (ENSURE ENLIVE / ENSURE PLUS) liquid 237 mL  237 mL Oral BID BM Dixon, Rashaun M, NP       hydrOXYzine  (ATARAX ) tablet 50 mg  50 mg Oral Q6H PRN Jadapalle, Sree, MD   50 mg at 05/14/23 2250   magnesium  hydroxide (MILK OF MAGNESIA) suspension 30 mL  30 mL Oral Daily PRN Roise Cleaver, NP   30 mL at 05/13/23 0786   OLANZapine  (ZYPREXA ) injection 5 mg  5 mg Intramuscular TID PRN Roise Cleaver, NP   5 mg at 05/08/23 2224   OLANZapine  (ZYPREXA ) tablet 2.5 mg  2.5 mg Oral Q8H PRN Roise Cleaver, NP   2.5 mg at 05/08/23 0841   ondansetron  (ZOFRAN -ODT) disintegrating tablet 4 mg  4 mg Oral Q8H PRN Roise Cleaver, NP   4 mg at 05/14/23 1520   risperiDONE  (RISPERDAL  M-TABS) disintegrating tablet 0.5 mg  0.5 mg Oral Daily Jadapalle, Sree, MD   0.5 mg at 05/14/23 7544   risperiDONE  (RISPERDAL  M-TABS) disintegrating tablet 2 mg  2 mg Oral QHS Jadapalle, Sree, MD   2 mg at 05/14/23 2110    Lab Results:  Results for orders placed or performed during the hospital encounter of 05/05/23 (from the past 48 hours)  Lipid panel     Status: None   Collection Time: 05/13/23  6:42 AM  Result Value Ref Range   Cholesterol 124 0 -  200 mg/dL   Triglycerides 64 <920 mg/dL   HDL 59 >10 mg/dL   Total CHOL/HDL Ratio 2.1 RATIO   VLDL 13 0 - 40 mg/dL   LDL Cholesterol 52 0 - 99 mg/dL    Comment:        Total Cholesterol/HDL:CHD Risk Coronary Heart Disease Risk Table  Men   Women  1/2 Average Risk   3.4   3.3  Average Risk       5.0   4.4  2 X Average Risk   9.6   7.1  3 X Average Risk  23.4   11.0        Use the calculated Patient Ratio above and the CHD Risk Table to determine the patient's CHD Risk.        ATP III CLASSIFICATION (LDL):  <100     mg/dL   Optimal  130-865  mg/dL   Near or Above                    Optimal  130-159  mg/dL   Borderline  784-696  mg/dL   High  >295     mg/dL   Very High Performed at [facility], [address] [place], [place] [address]   Hemoglobin A1c     Status: None   Collection Time: 05/13/23  6:42 AM  Result Value Ref Range   Hgb A1c MFr Bld 5.0 4.8 - 5.6 %    Comment: (NOTE) Pre diabetes:          5.7%-6.4%  Diabetes:              >6.4%  Glycemic control for   <7.0% adults with diabetes    Mean Plasma Glucose 96.8 mg/dL    Comment: Performed at [facility], [address], [place], [place] [address]    Blood Alcohol level:  Lab Results  Component Value Date   [facility] <10 05/04/2023   ETH <11 01/13/2014    Metabolic Disorder Labs: Lab Results  Component Value Date   HGBA1C 5.0 05/13/2023   MPG 96.8 05/13/2023   No results found for: "PROLACTIN" Lab Results  Component Value Date   CHOL 124 05/13/2023   TRIG 64 05/13/2023   HDL 59 05/13/2023   CHOLHDL 2.1 05/13/2023   VLDL 13 05/13/2023   LDLCALC 52 05/13/2023    Physical Findings: AIMS:  , ,  ,  ,    CIWA:    COWS:      Psychiatric Specialty Exam:  Presentation  General Appearance:  Casual; Appropriate for Environment  Eye Contact: Fair  Speech: Clear and Coherent  Speech Volume: Normal    Mood and Affect   Mood: Anxious  Affect: Appropriate   Thought Process  Thought Processes: Irrevelant  Descriptions of Associations:Intact  Orientation:Partial  Thought Content:Delusions; Paranoid Ideation  Hallucinations:No data recorded  Ideas of Reference:Paranoia  Suicidal Thoughts:No data recorded  Homicidal Thoughts:No data recorded   Sensorium  Memory: Immediate Fair; Recent Fair; Remote Poor  Judgment: Impaired  Insight: Shallow   Executive Functions  Concentration: Fair  Attention Span: Fair  Recall: Poor  Fund of Knowledge: Fair  Language: Fair   Psychomotor Activity  Psychomotor Activity: No data recorded  Musculoskeletal: Strength & Muscle Tone: decreased Gait & Station: unsteady Assets  Assets: Manufacturing systems engineer; Desire for Improvement; Resilience; Social Support    Physical Exam:   Vitals are normal Review of Systems  Constitutional:  Positive for malaise/fatigue.  HENT: Negative.    Eyes: Negative.   Cardiovascular: Negative.   Skin: Negative.    Blood pressure 127/71, pulse 87, temperature 97.8 F (36.6 C), temperature source Oral, resp. rate 16, height 5\' 7"  (1.702 m), weight 67.4 kg, SpO2 98%. Body mass index is 23.26 kg/m.  Diagnosis: Principal Problem:   Schizophrenia (HCC)  Clinical Decision Making: Patient with reported history of schizophrenia, anxiety admitted for suicidal ideation and worsening anxiety with some paranoia.  Patient is unable to identify any triggers.  No history of substance use currently.  Patient has severe anxiety.  She needs inpatient hospitalization for further stabilization   Treatment Plan Summary:   Safety and Monitoring:             -- Voluntary admission to inpatient psychiatric unit for safety, stabilization and treatment             -- Daily contact with patient to assess and evaluate symptoms and progress in treatment             -- Patient's case to be discussed in multi-disciplinary  team meeting             -- Observation Level: q15 minute checks             -- Vital signs:  q12 hours             -- Precautions: suicide, elopement, and assault   2. Psychiatric Diagnoses and Treatment:   Klonopin  0.5 twice daily given severe anxiety displayed by the patient  continue Risperdal  0.5 mg every morning and 2 mg nightly with the plan to use LAI Risperdal  Consta 25 mg Q2 weeks -dose to be given 05/15/23  Will monitor urinary retention.  Per chart patient has documentation of multiple psychotropics having allergic reaction to.     -- The risks/benefits/side-effects/alternatives to this medication were discussed in detail with the patient and time was given for questions. The patient consents to medication trial.                -- Metabolic profile and EKG monitoring obtained while on an atypical antipsychotic (BMI: Lipid Panel: HbgA1c: QTc:)              -- Encouraged patient to participate in unit milieu and in scheduled group therapies                            3. Medical Issues Being Addressed:  No urgent medical needs identified UTI patient is currently prescribed antibiotics   4. Discharge Planning:              -- Social work and case management to assist with discharge planning and identification of hospital follow-up needs prior to discharge             -- Estimated LOS: 5-7 days             -- Discharge Concerns: Need to establish a safety plan; Medication compliance and effectiveness             -- Discharge Goals: Return home with outpatient referrals follow ups   Physician Treatment Plan for Primary Diagnosis: Schizophrenia (HCC) Long Term Goal(s): Improvement in symptoms so as ready for discharge   Short Term Goals: Ability to identify changes in lifestyle to reduce recurrence of condition will improve, Ability to verbalize feelings will improve, Ability to disclose and discuss suicidal ideas, Ability to demonstrate self-control will improve, and Ability to  identify and develop effective coping behaviors will improve   Physician Treatment Plan for Secondary Diagnosis: Principal Problem:   Schizophrenia (HCC)   Long Term Goal(s): Improvement in symptoms so as ready for discharge   Short Term Goals: Ability to identify changes in lifestyle to reduce recurrence of condition will improve, Ability  to verbalize feelings will improve, Ability to disclose and discuss suicidal ideas, Ability to demonstrate self-control will improve, Ability to identify and develop effective coping behaviors will improve, Ability to maintain clinical measurements within normal limits will improve, and Compliance with prescribed medications will improve    Jadin Kagel R Verenis [place], MD 05/15/2023, 6:01 AM

## 2023-05-16 DIAGNOSIS — F200 Paranoid schizophrenia: Secondary | ICD-10-CM | POA: Diagnosis not present

## 2023-05-16 MED ORDER — RISPERIDONE 0.5 MG PO TABS
1.0000 mg | ORAL_TABLET | Freq: Every day | ORAL | Status: DC
  Administered 2023-05-16: 1 mg via ORAL
  Filled 2023-05-16 (×3): qty 2

## 2023-05-16 MED ORDER — RISPERIDONE MICROSPHERES ER 25 MG IM SRER
25.0000 mg | Freq: Once | INTRAMUSCULAR | Status: AC
  Administered 2023-05-16: 25 mg via INTRAMUSCULAR
  Filled 2023-05-16: qty 2

## 2023-05-16 NOTE — Group Note (Signed)
 Date:  05/16/2023 Time:  4:18 PM  Group Topic/Focus:  Movie Group The purpose of this group is to have patients to enjoy a relaxing movie of their choice    Participation Level:  Did Not Attend  Participation Quality:    Affect:    Cognitive:    Insight:   Engagement in Group:    Modes of Intervention:    Additional Comments:  Did not attend   Vicki Thomas T Vicki Thomas 05/16/2023, 4:18 PM

## 2023-05-16 NOTE — Progress Notes (Signed)
   05/16/23 2200  Psych Admission Type (Psych Patients Only)  Admission Status Voluntary  Psychosocial Assessment  Patient Complaints Anxiety;Depression  Eye Contact Fair  Facial Expression Anxious;Animated  Affect Anxious  Speech Soft  Interaction Needy  Motor Activity Slow  Appearance/Hygiene Unremarkable  Behavior Characteristics Cooperative  Mood Anxious;Pleasant  Thought Process  Coherency Circumstantial  Content Preoccupation  Delusions None reported or observed  Perception Hallucinations  Hallucination Auditory;Visual  Judgment Impaired  Confusion Mild  Danger to Self  Current suicidal ideation? Denies  Agreement Not to Harm Self Yes  Description of Agreement verbal  Danger to Others  Danger to Others None reported or observed

## 2023-05-16 NOTE — [facility] Counselor (Signed)
 CSW contacted pt's daughter [name], [phone].   CSW unable to reach, unable to LVM due to mailbox being full  Derrill Flirt, MSW, [facility] 05/16/2023 3:20 PM

## 2023-05-16 NOTE — Progress Notes (Signed)
 [facility] MD Progress Note  05/16/2023 12:41 PM Vicki Thomas  MRN:  [id number]  Vicki Thomas is a 64 y.o. female admitted: Presented to the EDfor 05/04/2023  5:23 PM for hallucinations, suicidal ideations. She carries the psychiatric diagnoses of schizophrenia, bipolar and has a past medical history of  UTI. Her current presentation of suicidal ideations, hallucinations, paranoia is most consistent with schizophrenia.Patient is admitted to [facility] unit with Q15 min safety monitoring. Multidisciplinary team approach is offered. Medication management; group/milieu therapy is offered.  Subjective:  Chart reviewed, case discussed in multidisciplinary meeting, patient seen during rounds.  Today patient is noted to be working with the physical therapy in the morning.  At the time of interview patient is resting in her bed and reports that she feels tired and nauseous.  Provider encouraged her to reach out to her PCP to get her an appointment to evaluate the nausea after her discharge.  Patient reports that the hallucinations are a lot better and now she hears only a sound of breath at night.  She denies consistently suicidal/homicidal ideation/plan.  She reports that she had some panic attack today morning during the PT.  She rates her depression as 4 out of 10, 10 being the worst.  She is taking her medications and provided discussed about giving her Risperdal  Consta LAI today and discharge tomorrow morning.  Patient agreed with the plan. patient's daughter Vicki Thomas [phone] and updated on 05/16/2023 about treatment plan and discharge planning on 05/17/2023 Sleep: Fair  Appetite:  Fair  Past Psychiatric History: see h&P Family History:  Family History  Problem Relation Age of Onset   Alcohol abuse Mother    Bipolar disorder Father    Paranoid behavior Father    Alcohol abuse Father    Bipolar disorder Sister    Anxiety disorder Sister    Alcohol abuse Brother    Drug abuse Brother     Bipolar disorder Paternal Aunt    Schizophrenia Maternal Grandfather    Anxiety disorder Maternal Aunt    Social History:  Social History   Substance and Sexual Activity  Alcohol Use No     Social History   Substance and Sexual Activity  Drug Use No    Social History   Socioeconomic History   Marital status: Single    Spouse name: Not on file   Number of children: Not on file   Years of education: Not on file   Highest education level: Not on file  Occupational History   Not on file  Tobacco Use   Smoking status: Former    Current packs/day: 1.00    Average packs/day: 1 pack/day for 6.0 years (6.0 ttl pk-yrs)    Types: Cigarettes   Smokeless tobacco: Never  Vaping Use   Vaping status: Never Used  Substance and Sexual Activity   Alcohol use: No   Drug use: No   Sexual activity: Not Currently  Other Topics Concern   Not on file  Social History Narrative   Not on file   Social Drivers of Health   Financial Resource Strain: Not on file  Food Insecurity: Patient Declined (05/05/2023)   Hunger Vital Sign    Worried About Running Out of Food in the Last Year: Patient declined    Ran Out of Food in the Last Year: Patient declined  Transportation Needs: No Transportation Needs (05/05/2023)   PRAPARE - Administrator, Civil Service (Medical): No    Lack of Transportation (Non-Medical): No  Physical Activity: Not on file  Stress: Not on file  Social Connections: Unknown (05/30/2021)   Received from [facility]   Social Network    Social Network: Not on file   Past Medical History:  Past Medical History:  Diagnosis Date   Anxiety    Arthritis    Bipolar 1 disorder (HCC)    Chronic back pain    Chronic fatigue    Chronic headaches    Chronic pain    Fibromyalgia    Hiatal hernia    History of borderline personality disorder    Interstitial cystitis    Lymphedema    Panic disorder with agoraphobia    PTSD (post-traumatic stress disorder)      Past Surgical History:  Procedure Laterality Date   ABDOMINAL HYSTERECTOMY     APPENDECTOMY     BARIATRIC SURGERY     CHOLECYSTECTOMY     ESOPHAGUS SURGERY     FOOT SURGERY     HEMORRHOID SURGERY     HERNIA REPAIR     TONSILLECTOMY      Current Medications: Current Facility-Administered Medications  Medication Dose Route Frequency Provider Last Rate Last Admin   acetaminophen  (TYLENOL ) tablet 650 mg  650 mg Oral Q4H PRN Roise Cleaver, NP   650 mg at 05/16/23 0943   alum & mag hydroxide-simeth (MAALOX/MYLANTA) 200-200-20 MG/5ML suspension 30 mL  30 mL Oral Q4H PRN Roise Cleaver, NP   30 mL at 05/16/23 0945   clonazePAM  (KLONOPIN ) tablet 0.5 mg  0.5 mg Oral BID Delaila Nand, MD   0.5 mg at 05/16/23 7829   clotrimazole  (GYNE-LOTRIMIN ) vaginal cream 1 Applicatorful  1 Applicatorful Vaginal QHS Aurelia Blotter, MD   1 Applicatorful at 05/15/23 2210   diclofenac  Sodium (VOLTAREN ) 1 % topical gel 2 g  2 g Topical QID Aurelia Blotter, MD   2 g at 05/16/23 0945   feeding supplement (ENSURE ENLIVE / ENSURE PLUS) liquid 237 mL  237 mL Oral BID BM Dixon, Rashaun M, NP   237 mL at 05/16/23 0947   hydrOXYzine  (ATARAX ) tablet 50 mg  50 mg Oral Q6H PRN Melanee Cordial, MD   50 mg at 05/16/23 0943   magnesium  hydroxide (MILK OF MAGNESIA) suspension 30 mL  30 mL Oral Daily PRN Roise Cleaver, NP   30 mL at 05/13/23 5621   OLANZapine  (ZYPREXA ) injection 5 mg  5 mg Intramuscular TID PRN Roise Cleaver, NP   5 mg at 05/08/23 2224   OLANZapine  (ZYPREXA ) tablet 2.5 mg  2.5 mg Oral Q8H PRN Roise Cleaver, NP   2.5 mg at 05/08/23 0841   ondansetron  (ZOFRAN -ODT) disintegrating tablet 4 mg  4 mg Oral Q8H PRN Roise Cleaver, NP   4 mg at 05/16/23 0050   risperiDONE  (RISPERDAL  M-TABS) disintegrating tablet 0.5 mg  0.5 mg Oral Daily Benjimin Hadden, MD   0.5 mg at 05/16/23 0944   risperiDONE  (RISPERDAL ) tablet 1 mg  1 mg Oral QHS Gabbi Whetstone, MD       risperiDONE  microspheres (RISPERDAL  CONSTA)  injection 25 mg  25 mg Intramuscular Once Vasiliki Smaldone, MD        Lab Results:  No results found for this or any previous visit (from the past 48 hours).   Blood Alcohol level:  Lab Results  Component Value Date   [facility] <10 05/04/2023   ETH <11 01/13/2014    Metabolic Disorder Labs: Lab Results  Component Value Date   HGBA1C 5.0 05/13/2023   MPG 96.8 05/13/2023  No results found for: "PROLACTIN" Lab Results  Component Value Date   CHOL 124 05/13/2023   TRIG 64 05/13/2023   HDL 59 05/13/2023   CHOLHDL 2.1 05/13/2023   VLDL 13 05/13/2023   LDLCALC 52 05/13/2023    Physical Findings: AIMS:  , ,  ,  ,    CIWA:    COWS:      Psychiatric Specialty Exam:  Presentation  General Appearance:  Appropriate for Environment; Casual  Eye Contact: Fair  Speech: Clear and Coherent  Speech Volume: Normal    Mood and Affect  Mood: Anxious  Affect: Congruent   Thought Process  Thought Processes: Coherent  Descriptions of Associations:Intact  Orientation:Partial  Thought Content:Illogical  Hallucinations:Hallucinations: None  Ideas of Reference:Paranoia (chronic and improving)  Suicidal Thoughts:Suicidal Thoughts: No  Homicidal Thoughts:Homicidal Thoughts: No   Sensorium  Memory: Immediate Fair; Recent Fair; Remote Poor  Judgment: Impaired  Insight: Shallow   Executive Functions  Concentration: Fair  Attention Span: Fair  Recall: [employer] of Knowledge: Fair  Language: Fair   Psychomotor Activity  Psychomotor Activity: Psychomotor Activity: Normal  Musculoskeletal: Strength & Muscle Tone: decreased Gait & Station: unsteady Assets  Assets: Manufacturing systems engineer; Desire for Improvement; Physical Health; Resilience; Financial Resources/Insurance    Physical Exam: Physical Exam Vitals and nursing note reviewed.  HENT:     Head: Normocephalic.  Cardiovascular:     Pulses: Normal pulses.  Skin:    General: Skin  is warm.  Neurological:     Mental Status: She is alert and oriented to person, place, and time.    Review of Systems  Constitutional:  Positive for malaise/fatigue.  HENT: Negative.    Eyes: Negative.   Cardiovascular: Negative.   Skin: Negative.    Blood pressure (!) 93/59, pulse 77, temperature (!) 97.3 F (36.3 C), resp. rate 16, height 5\' 7"  (1.702 m), weight 67.4 kg, SpO2 98%. Body mass index is 23.26 kg/m.  Diagnosis: Principal Problem:   Schizophrenia [facility])  Clinical Decision Making: Patient with reported history of schizophrenia, anxiety admitted for suicidal ideation and worsening anxiety with some paranoia.  Patient is unable to identify any triggers.  No history of substance use currently.  Patient has severe anxiety.  She needs inpatient hospitalization for further stabilization   Treatment Plan Summary:   Safety and Monitoring:             -- Voluntary admission to inpatient psychiatric unit for safety, stabilization and treatment             -- Daily contact with patient to assess and evaluate symptoms and progress in treatment             -- Patient's case to be discussed in multi-disciplinary team meeting             -- Observation Level: q15 minute checks             -- Vital signs:  q12 hours             -- Precautions: suicide, elopement, and assault   2. Psychiatric Diagnoses and Treatment:   Klonopin  0.5 twice daily given severe anxiety displayed by the patient  Risperdal  0.5 mg every morning and reduce night time dose to 1mg  give LAI today     LAI Risperdal  Consta 25 mg Q2 weeks -dose to be given 05/16/23  Discontinued Zyprexa  7.5 nightly due to poor efficacy Risperdal  is not in the allergy section-  Per chart patient has  documentation of multiple psychotropics having allergic reaction to.     -- The risks/benefits/side-effects/alternatives to this medication were discussed in detail with the patient and time was given for questions. The patient  consents to medication trial.                -- Metabolic profile and EKG monitoring obtained while on an atypical antipsychotic (BMI: Lipid Panel: HbgA1c: QTc:)              -- Encouraged patient to participate in unit milieu and in scheduled group therapies                            3. Medical Issues Being Addressed:  No urgent medical needs identified UTI patient is currently prescribed antibiotics   4. Discharge Planning: Possible discharge on 05/17/23             -- Social work and case management to assist with discharge planning and identification of hospital follow-up needs prior to discharge             -- Estimated LOS: 1-2 days             -- Discharge Concerns: Need to establish a safety plan; Medication compliance and effectiveness             -- Discharge Goals: Return home with outpatient referrals follow ups   Physician Treatment Plan for Primary Diagnosis: Schizophrenia (HCC) Long Term Goal(s): Improvement in symptoms so as ready for discharge   Short Term Goals: Ability to identify changes in lifestyle to reduce recurrence of condition will improve, Ability to verbalize feelings will improve, Ability to disclose and discuss suicidal ideas, Ability to demonstrate self-control will improve, and Ability to identify and develop effective coping behaviors will improve   Physician Treatment Plan for Secondary Diagnosis: Principal Problem:   Schizophrenia (HCC)   Long Term Goal(s): Improvement in symptoms so as ready for discharge   Short Term Goals: Ability to identify changes in lifestyle to reduce recurrence of condition will improve, Ability to verbalize feelings will improve, Ability to disclose and discuss suicidal ideas, Ability to demonstrate self-control will improve, Ability to identify and develop effective coping behaviors will improve, Ability to maintain clinical measurements within normal limits will improve, and Compliance with prescribed medications will  improve    Aurelia Blotter, MD 05/16/2023, 12:41 PM

## 2023-05-16 NOTE — Progress Notes (Signed)
 Physical Therapy Treatment Patient Details Name: Vicki Thomas MRN: [id number] DOB: 1961/01/01 Today's Date: 05/16/2023   History of Present Illness Pt is a 63 yo female that presented to ED for hallucinations, SI, anxiety. PMH of bipolar, schizophrenia, chronic back pain, panic disorder with agoraphobia, PTSD, personality disorder, anxiety, fibromyalgia, recent UTI diagnosis.    PT Comments  Pt in/OOB without assist.  She opts to use RW today vs wheelchair.  Walks 300' x 1 then and additional 100' after being unable to void.  She is able to manage in bathroom on her own and stated she has been walking on unit with walker with increased distances on her own yesterday.  Pt does voice some concern over her perception of lack of voiding.  Stated felt she had to go and went unto bathroom but unable to start void.  C/o some burning. Stated she "dribbled" some about 30 minutes ago. She stated she did have recent UTI but unable to state when and if treated.  Secure chat to RN in regards to pt concerns.   If plan is discharge home, recommend the following: Assist for transportation;Help with stairs or ramp for entrance;Assistance with cooking/housework   Can travel by private vehicle        Equipment Recommendations  None recommended by PT    Recommendations for Other Services       Precautions / Restrictions Precautions Precautions: Fall Recall of Precautions/Restrictions: Intact Restrictions Weight Bearing Restrictions Per Provider Order: No     Mobility  Bed Mobility Overal bed mobility: Modified Independent               Patient Response: Cooperative  Transfers Overall transfer level: Modified independent                 General transfer comment: to RW.  verbal review of locking brakes if using wheelchair/rollator    Ambulation/Gait Ambulation/Gait assistance: Supervision Gait Distance (Feet): 300 Feet Assistive device: Rolling walker (2 wheels) Gait  Pattern/deviations: Step-through pattern Gait velocity: decreased     General Gait Details: 300' then 100' after bathroom before fatigue   Stairs             Wheelchair Mobility     Tilt Bed Tilt Bed Patient Response: Cooperative  Modified Rankin (Stroke Patients Only)       Balance Overall balance assessment: Mild deficits observed, not formally tested Sitting-balance support: Feet supported Sitting balance-Leahy Scale: Normal     Standing balance support: Bilateral upper extremity supported Standing balance-Leahy Scale: Good                              Communication Communication Communication: No apparent difficulties  Cognition Arousal: Alert Behavior During Therapy: WFL for tasks assessed/performed   PT - Cognitive impairments: No apparent impairments                       PT - Cognition Comments: oriented to self, place, date Following commands: Intact      Cueing    Exercises      General Comments        Pertinent Vitals/Pain Pain Assessment Pain Assessment: No/denies pain    Home Living                          Prior Function            PT Goals (  current goals can now be found in the care plan section) Progress towards PT goals: Progressing toward goals    Frequency    Min 1X/week      PT Plan      Co-evaluation              AM-PAC PT "6 Clicks" Mobility   Outcome Measure  Help needed turning from your back to your side while in a flat bed without using bedrails?: None Help needed moving from lying on your back to sitting on the side of a flat bed without using bedrails?: None Help needed moving to and from a bed to a chair (including a wheelchair)?: None Help needed standing up from a chair using your arms (e.g., wheelchair or bedside chair)?: None Help needed to walk in hospital room?: None Help needed climbing 3-5 steps with a railing? : A Little 6 Click Score: 23    End of  Session Equipment Utilized During Treatment: Gait belt Activity Tolerance: Patient tolerated treatment well Patient left: in bed   PT Visit Diagnosis: Other abnormalities of gait and mobility (R26.89);Difficulty in walking, not elsewhere classified (R26.2);Muscle weakness (generalized) (M62.81)     Time: 8657-8469 PT Time Calculation (min) (ACUTE ONLY): 20 min  Charges:    $Gait Training: 8-22 mins PT General Charges $$ ACUTE PT VISIT: 1 Visit                    Charlanne Cong, PTA 05/16/23, 10:26 AM

## 2023-05-16 NOTE — BH IP Treatment Plan (Signed)
 Interdisciplinary Treatment and Diagnostic Plan Update  05/16/2023 Time of Session: 10:00 AM  Vicki Thomas MRN: [id number]  Principal Diagnosis: Schizophrenia [facility])  Secondary Diagnoses: Principal Problem:   Schizophrenia (HCC)   Current Medications:  Current Facility-Administered Medications  Medication Dose Route Frequency Provider Last Rate Last Admin   acetaminophen  (TYLENOL ) tablet 650 mg  650 mg Oral Q4H PRN Roise Cleaver, NP   650 mg at 05/16/23 0943   alum & mag hydroxide-simeth (MAALOX/MYLANTA) 200-200-20 MG/5ML suspension 30 mL  30 mL Oral Q4H PRN Roise Cleaver, NP   30 mL at 05/16/23 0945   clonazePAM  (KLONOPIN ) tablet 0.5 mg  0.5 mg Oral BID Jadapalle, Sree, MD   0.5 mg at 05/16/23 4098   clotrimazole  (GYNE-LOTRIMIN ) vaginal cream 1 Applicatorful  1 Applicatorful Vaginal QHS Aurelia Blotter, MD   1 Applicatorful at 05/15/23 2210   diclofenac  Sodium (VOLTAREN ) 1 % topical gel 2 g  2 g Topical QID Jadapalle, Sree, MD   2 g at 05/16/23 0945   feeding supplement (ENSURE ENLIVE / ENSURE PLUS) liquid 237 mL  237 mL Oral BID BM Dixon, Rashaun M, NP   237 mL at 05/16/23 0947   hydrOXYzine  (ATARAX ) tablet 50 mg  50 mg Oral Q6H PRN Jadapalle, Sree, MD   50 mg at 05/16/23 1191   magnesium  hydroxide (MILK OF MAGNESIA) suspension 30 mL  30 mL Oral Daily PRN Roise Cleaver, NP   30 mL at 05/13/23 4782   OLANZapine  (ZYPREXA ) injection 5 mg  5 mg Intramuscular TID PRN Roise Cleaver, NP   5 mg at 05/08/23 2224   OLANZapine  (ZYPREXA ) tablet 2.5 mg  2.5 mg Oral Q8H PRN Roise Cleaver, NP   2.5 mg at 05/08/23 0841   ondansetron  (ZOFRAN -ODT) disintegrating tablet 4 mg  4 mg Oral Q8H PRN Roise Cleaver, NP   4 mg at 05/16/23 0050   risperiDONE  (RISPERDAL  M-TABS) disintegrating tablet 0.5 mg  0.5 mg Oral Daily Jadapalle, Sree, MD   0.5 mg at 05/16/23 0944   risperiDONE  (RISPERDAL  M-TABS) disintegrating tablet 2 mg  2 mg Oral QHS Jadapalle, Sree, MD   2 mg at 05/15/23 2122   PTA  Medications: Medications Prior to Admission  Medication Sig Dispense Refill Last Dose/Taking   aspirin-sod bicarb-citric acid (ALKA-SELTZER) 325 MG TBEF tablet Take 325 mg by mouth every 6 (six) hours as needed.      bisacodyl  (DULCOLAX) 5 MG EC tablet Take 5 mg by mouth daily as needed for moderate constipation.      ciprofloxacin  (CIPRO ) 500 MG tablet Take 1 tablet (500 mg total) by mouth 2 (two) times daily. One po bid x 7 days (Patient not taking: Reported on 05/05/2023) 14 tablet 0    guaiFENesin (ROBITUSSIN) 100 MG/5ML liquid Take 5 mLs by mouth every 4 (four) hours as needed for cough or to loosen phlegm.      ibuprofen  (ADVIL ) 200 MG tablet Take 400 mg by mouth every 6 (six) hours as needed for moderate pain (pain score 4-6).      OVER THE COUNTER MEDICATION Indegestion and gas      polyethylene glycol (MIRALAX / GLYCOLAX) 17 g packet Take 17 g by mouth daily.      Potassium 99 MG TABS Take 1 tablet by mouth daily.      potassium chloride  (K-DUR) 10 MEQ tablet Take 1 tablet (10 mEq total) by mouth daily. (Patient not taking: Reported on 05/05/2023) 10 tablet 0    Pseudoeph-Doxylamine-DM-APAP (NYQUIL MULTI-SYMPTOM PO) Take by  mouth.      simethicone  (MYLICON) 125 MG chewable tablet Chew 125 mg by mouth every 6 (six) hours as needed for flatulence.      Zinc Oxide 16 % OINT Apply topically.       Patient Stressors:    Patient Strengths:    Treatment Modalities: Medication Management, Group therapy, Case management,  1 to 1 session with clinician, Psychoeducation, Recreational therapy.   Physician Treatment Plan for Primary Diagnosis: Schizophrenia (HCC) Long Term Goal(s): Improvement in symptoms so as ready for discharge   Short Term Goals: Ability to identify changes in lifestyle to reduce recurrence of condition will improve Ability to verbalize feelings will improve Ability to disclose and discuss suicidal ideas Ability to demonstrate self-control will improve Ability to  identify and develop effective coping behaviors will improve Ability to maintain clinical measurements within normal limits will improve Compliance with prescribed medications will improve  Medication Management: Evaluate patient's response, side effects, and tolerance of medication regimen.  Therapeutic Interventions: 1 to 1 sessions, Unit Group sessions and Medication administration.  Evaluation of Outcomes: Progressing  Physician Treatment Plan for Secondary Diagnosis: Principal Problem:   Schizophrenia (HCC)  Long Term Goal(s): Improvement in symptoms so as ready for discharge   Short Term Goals: Ability to identify changes in lifestyle to reduce recurrence of condition will improve Ability to verbalize feelings will improve Ability to disclose and discuss suicidal ideas Ability to demonstrate self-control will improve Ability to identify and develop effective coping behaviors will improve Ability to maintain clinical measurements within normal limits will improve Compliance with prescribed medications will improve     Medication Management: Evaluate patient's response, side effects, and tolerance of medication regimen.  Therapeutic Interventions: 1 to 1 sessions, Unit Group sessions and Medication administration.  Evaluation of Outcomes: Progressing   RN Treatment Plan for Primary Diagnosis: Schizophrenia (HCC) Long Term Goal(s): Knowledge of disease and therapeutic regimen to maintain health will improve  Short Term Goals: Ability to remain free from injury will improve, Ability to verbalize frustration and anger appropriately will improve, Ability to demonstrate self-control, Ability to participate in decision making will improve, Ability to verbalize feelings will improve, Ability to disclose and discuss suicidal ideas, Ability to identify and develop effective coping behaviors will improve, and Compliance with prescribed medications will improve  Medication Management: RN  will administer medications as ordered by provider, will assess and evaluate patient's response and provide education to patient for prescribed medication. RN will report any adverse and/or side effects to prescribing provider.  Therapeutic Interventions: 1 on 1 counseling sessions, Psychoeducation, Medication administration, Evaluate responses to treatment, Monitor vital signs and CBGs as ordered, Perform/monitor CIWA, COWS, AIMS and Fall Risk screenings as ordered, Perform wound care treatments as ordered.  Evaluation of Outcomes: Progressing   LCSW Treatment Plan for Primary Diagnosis: Schizophrenia (HCC) Long Term Goal(s): Safe transition to appropriate next level of care at discharge, Engage patient in therapeutic group addressing interpersonal concerns.  Short Term Goals: Engage patient in aftercare planning with referrals and resources, Increase social support, Increase ability to appropriately verbalize feelings, Increase emotional regulation, Facilitate acceptance of mental health diagnosis and concerns, Facilitate patient progression through stages of change regarding substance use diagnoses and concerns, Identify triggers associated with mental health/substance abuse issues, and Increase skills for wellness and recovery  Therapeutic Interventions: Assess for all discharge needs, 1 to 1 time with Social worker, Explore available resources and support systems, Assess for adequacy in community support network, Educate family and significant  other(s) on suicide prevention, Complete Psychosocial Assessment, Interpersonal group therapy.  Evaluation of Outcomes: Progressing   Progress in Treatment: Attending groups: Yes. Participating in groups: Yes. Taking medication as prescribed: Yes. Toleration medication: Yes. Family/Significant other contact made: No, will contact:  once permission has been granted.  Patient understands diagnosis: Yes. Discussing patient identified problems/goals  with staff: Yes. Medical problems stabilized or resolved: Yes. Denies suicidal/homicidal ideation: Yes. Issues/concerns per patient self-inventory: No. Other: none   New problem(s) identified: No, Describe:  none Update 05/11/23: No changes at this time Update 05/16/23: No changes at this time   New Short Term/Long Term Goal(s): detox, elimination of symptoms of psychosis, medication management for mood stabilization; elimination of SI thoughts; development of comprehensive mental wellness/sobriety plan.  Update 05/11/23: No changes at this time Update 05/16/23: No changes at this time       Patient Goals:  "to get into some program" Update 05/11/23: No changes at this time Update 05/16/23: No changes at this time     Discharge Plan or Barriers: CSW to assist patient in development of appropriate discharge plans. Update 05/11/23: No changes at this time Update 05/16/23: No changes at this time     Reason for Continuation of Hospitalization: Anxiety Depression Medication stabilization Suicidal ideation   Estimated Length of Stay:  1-7 days Update 05/11/23: TBD Update 05/16/23: 05/17/23 Last 3 [place] Suicide Severity Risk Score: Flowsheet Row Admission (Current) from 05/05/2023 in [facility] from 05/04/2023 in [facility] Emergency Department at [place] [facility] from 05/03/2023 in [facility] Department at [facility]  C-SSRS RISK CATEGORY High Risk High Risk No Risk       Last [facility] 2/9 Scores:    12/10/2015   12:18 PM  Depression screen PHQ 2/9  Decreased Interest 3  Down, Depressed, Hopeless 2  PHQ - 2 Score 5  Altered sleeping 3  Tired, decreased energy 1  Change in appetite 3  Feeling bad or failure about yourself  3  Trouble concentrating 1  Moving slowly or fidgety/restless 3  Suicidal thoughts 0  PHQ-9 Score 19  Difficult doing work/chores Somewhat difficult    Scribe for Treatment Team: Claudio Culver,  Milinda Allen 05/16/2023 12:23 PM

## 2023-05-16 NOTE — Plan of Care (Signed)
 D: Pt alert and oriented. Pt reports experiencing anxiety/depression at this time. Pt reports experiencing chronic back pain, prn medication given. Pt denies experiencing any SI/HI at this time however endorses AVH, no details were given by the patient.   A: Scheduled medications administered to pt, per MD orders. Support and encouragement provided. Frequent verbal contact made. Routine safety checks conducted q15 minutes.   R: No adverse drug reactions noted. Pt verbally contracts for safety at this time. Pt compliant with medications and treatment plan. Pt interacts well with others on the unit. Pt remains safe at this time. Plan of care ongoing.  Problem: Education: Goal: Emotional status will improve Outcome: Not Progressing Goal: Mental status will improve Outcome: Not Progressing

## 2023-05-16 NOTE — Plan of Care (Signed)
  Problem: Education: Goal: Emotional status will improve Outcome: Progressing Goal: Mental status will improve Outcome: Progressing Goal: Verbalization of understanding the information provided will improve Outcome: Progressing  Patient is pleasant compliant with treatment plan. Endorses AH and anxiety with depression Prn Vistaril  50 mg given at 0050 for anxiety , Tylenol  650 mg PO for back pain and Zofran  for nausea all given at 0050 reported effective. Support and encouragement provided. Patient ambulating with wheel chair stated she has used her Front wheel walker during the day.

## 2023-05-16 NOTE — Group Note (Signed)
 Recreation Therapy Group Note   Group Topic:Communication  Group Date: 05/16/2023 Start Time: 1350 End Time: 1450 Facilitators: Deatrice Factor, LRT, CTRS Location: Courtyard  Group Description: Merchant navy officer. Patients drew a laminated card out of a bag that had a word or phrase on it. Pt encouraged to speak about a time in their life or fond memory that specifically relates to the word they chose out of the bag. An example would be: "parenthood, meals, siblings, travel, or home".  LRT prompted following questions and encouraged contribution from peers to increase communication.   Goal Area(s) Addressed: Patient will increase verbal communication by conversing with peers. Patient will contribute to group discussion with minimal prompting. Patient will reminisce a positive memory or moment in their life.   Affect/Mood: Appropriate   Participation Level: Active and Engaged   Participation Quality: Independent   Behavior: Calm and Cooperative   Speech/Thought Process: Coherent   Insight: Fair   Judgement: Fair    Modes of Intervention: Guided Discussion, Socialization, and Support   Patient Response to Interventions:  Attentive, Engaged, and Receptive   Education Outcome:  Acknowledges education   Clinical Observations/Individualized Feedback: Lindyn was active in their participation of session activities and group discussion. Pt shared that she use to have a coy pond outside at home and that she loves mature. Pt shared that she enjoys christian music. Pt assisted with watering the raised garden beds. Pt interacted well with LRT and peers duration of session.    Plan: Continue to engage patient in RT group sessions 2-3x/week.   Deatrice Factor, LRT, CTRS 05/16/2023 5:04 PM

## 2023-05-17 DIAGNOSIS — F200 Paranoid schizophrenia: Secondary | ICD-10-CM | POA: Diagnosis not present

## 2023-05-17 MED ORDER — CLONAZEPAM 0.5 MG PO TABS: 0.5000 mg | ORAL_TABLET | Freq: Two times a day (BID) | ORAL | 0 refills | Status: DC

## 2023-05-17 MED ORDER — CLOTRIMAZOLE 1 % VA CREA: 1.0000 | TOPICAL_CREAM | Freq: Every day | VAGINAL | 0 refills | Status: AC

## 2023-05-17 MED ORDER — RISPERIDONE 1 MG PO TABS: 1.0000 mg | ORAL_TABLET | Freq: Every day | ORAL | 0 refills | Status: DC

## 2023-05-17 MED ORDER — RISPERIDONE 0.5 MG PO TBDP: 0.5000 mg | ORAL_TABLET | Freq: Every day | ORAL | 0 refills | Status: DC

## 2023-05-17 NOTE — Group Note (Signed)
 Date:  05/17/2023 Time:  3:05 AM  Group Topic/Focus:  Wrap-Up Group:   The focus of this group is to help patients review their daily goal of treatment and discuss progress on daily workbooks.    Participation Level:  Did Not Attend  Participation Quality:      Affect:      Cognitive:      Insight: None  Engagement in Group:  None  Modes of Intervention:      Additional Comments:    [name] 05/17/2023, 3:05 AM

## 2023-05-17 NOTE — Plan of Care (Signed)
 D: Pt alert and oriented. Pt reports experiencing anxiety/depression at this time. Pt reports experiencing chronic back pain, prn medication and scheduled cream applied. Pt denies experiencing any SI/HI at this time, however endorses AVH with no details given at this time.   A: Scheduled medications administered to pt, per MD orders. Support and encouragement provided. Frequent verbal contact made. Routine safety checks conducted q15 minutes.   R: No adverse drug reactions noted. Pt verbally contracts for safety at this time. Pt compliant with medications and treatment plan. Pt interacts well with others on the unit. Pt remains safe at this time. Plan of care ongoing.  Problem: Education: Goal: Emotional status will improve Outcome: Not Progressing Goal: Mental status will improve Outcome: Not Progressing

## 2023-05-17 NOTE — Progress Notes (Signed)
  [facility] Adult Case Management Discharge Plan :  Will you be returning to the same living situation after discharge:  Yes,  pt will return home  At discharge, do you have transportation home?: Yes,  pt's daughter will pick her up  Do you have the ability to pay for your medications: Yes,  [employer] MEDICARE / HU[employer] MEDICARE CHOICE PPO  Release of information consent forms completed and in the chart;  Patient's signature needed at discharge.  Patient to Follow up at:  Follow-up Information     [facility]. Go on 05/26/2023.   Why: Per your request, I have scheduled a primary care appointment for 05/26/23 at  9:20 AM. Please arrive at the office at 9:05 AM. Please bring your insurance and ID. If there is a copay it will be collected at the time of your appointment. If you take any home medications, please be prepared to talk about those with the provider. You are scheduled with Blondie Burke, NP Contact information: [address] [place]  [phone]                Next level of care provider has access to [facility] Link:no  Safety Planning and Suicide Prevention discussed: Marleen Silvius, [phone] daughter     Has patient been referred to the Quitline?: Patient does not use tobacco/nicotine  products  Patient has been referred for addiction treatment: Patient refused referral for treatment.  [address] LCSWA 05/17/2023, 9:50 AM

## 2023-05-17 NOTE — Group Note (Signed)
 Date:  05/17/2023 Time:  10:43 AM  Group Topic/Focus:  Goals Group:   The focus of this group is to help patients establish daily goals to achieve during treatment and discuss how the patient can incorporate goal setting into their daily lives to aide in recovery.    Participation Level:  Did Not Attend  Participation Quality:    Affect:    Cognitive:    Insight:   Engagement in Group:    Modes of Intervention:    Additional Comments:    Lorcan Shelp 05/17/2023, 10:43 AM

## 2023-05-17 NOTE — BHH Counselor (Signed)
 CSW contacted pt's daughter [name], [phone].   CSW unable to reach, unable to LVM due to mailbox being full.  Derrill Flirt, MSW, [place] 05/17/2023 10:20 AM

## 2023-05-17 NOTE — Care Management Important Message (Signed)
 Important Message  Patient Details  Name: Vicki Thomas MRN: [id number] Date of Birth: 11-Dec-1960   Important Message Given:  Yes - Medicare IM     Claudio Culver, LCSWA 05/17/2023, 9:54 AM

## 2023-05-17 NOTE — Progress Notes (Signed)
 D: Pt alert and oriented. Pt denies experiencing any pain, SI/HI at this time. Pt reports she will be able to keep herself safe when she returns home. Pt has completed a suicide safety plan and was given a survey to fill out. AVS, SRA, and Transition Record reviewed and given to pt upon discharge. Pt does not wish to be contacted by the floor manager upon discharge.  A: Pt received discharge and medication education/information. Pt belongings were returned and signed for at this time.   R: Pt verbalized understanding of discharge and medication education/information.  Pt escorted by staff to medical mall front lobby where pt was picked up by family.

## 2023-05-17 NOTE — BHH Suicide Risk Assessment (Signed)
 [facility] Discharge Suicide Risk Assessment   Principal Problem: Schizophrenia [facility]) Discharge Diagnoses: Principal Problem:   Schizophrenia (HCC)   Total Time spent with patient: 30 minutes  Musculoskeletal: Strength & Muscle Tone: within normal limits Gait & Station: normal Patient leans: N/A  Psychiatric Specialty Exam  Presentation  General Appearance:  Appropriate for Environment; Casual  Eye Contact: Fair  Speech: Clear and Coherent  Speech Volume: Normal  Handedness: Right   Mood and Affect  Mood: Anxious  Duration of Depression Symptoms: No data recorded Affect: Congruent   Thought Process  Thought Processes: Coherent  Descriptions of Associations:Intact  Orientation:Partial  Thought Content:Illogical  History of Schizophrenia/Schizoaffective disorder:Yes  Duration of Psychotic Symptoms:Greater than six months  Hallucinations:Hallucinations: None  Ideas of Reference:Paranoia (chronic and improving)  Suicidal Thoughts:Suicidal Thoughts: No  Homicidal Thoughts:Homicidal Thoughts: No   Sensorium  Memory: Immediate Fair; Recent Fair; Remote Poor  Judgment: Impaired  Insight: Shallow   Executive Functions  Concentration: Fair  Attention Span: Fair  Recall: [employer] of Knowledge: Fair  Language: Fair   Psychomotor Activity  Psychomotor Activity: Psychomotor Activity: Normal   Assets  Assets: Communication Skills; Desire for Improvement; Physical Health; Resilience; Financial Resources/Insurance   Sleep  Sleep: Sleep: Fair   Physical Exam: Physical Exam ROS Blood pressure 105/65, pulse 72, temperature (!) 97.3 F (36.3 C), resp. rate 18, height 5\' 7"  (1.702 m), weight 67.4 kg, SpO2 97%. Body mass index is 23.26 kg/m.  Mental Status Per Nursing Assessment::   On Admission:  NA  Demographic Factors:  Caucasian and Low socioeconomic status  Loss Factors: Decrease in vocational status  Historical  Factors: Impulsivity  Risk Reduction Factors:   Sense of responsibility to family, Religious beliefs about death, Living with another person, especially a relative, Positive social support, Positive therapeutic relationship, and Positive coping skills or problem solving skills  Continued Clinical Symptoms:  Severe Anxiety and/or Agitation Depression:   Impulsivity  Cognitive Features That Contribute To Risk:  None    Suicide Risk:  Minimal: No identifiable suicidal ideation.  Patients presenting with no risk factors but with morbid ruminations; may be classified as minimal risk based on the severity of the depressive symptoms   Follow-up Information     [facility]. Go on 05/26/2023.   Why: Per your request, I have scheduled a primary care appointment for 05/26/23 at  9:20 AM. Please arrive at the office at 9:05 AM. Please bring your insurance and ID. If there is a copay it will be collected at the time of your appointment. If you take any home medications, please be prepared to talk about those with the provider. You are scheduled with Blondie Burke, NP Contact information: [address] [place]  [phone]                Plan Of Care/Follow-up recommendations:  Activity:  As tolerated  Aurelia Blotter, MD 05/17/2023, 9:08 AM

## 2023-05-17 NOTE — Group Note (Signed)
 Recreation Therapy Group Note   Group Topic:Stress Management  Group Date: 05/17/2023 Start Time: 1400 End Time: 1500 Facilitators: Deatrice Factor, LRT, CTRS Location: Courtyard  Group Description: Meditation. LRT and patients discussed what they know about meditation and mindfulness. LRT played a Deep Breathing Meditation exercise script for patients to follow along to. LRT and patients discussed how meditation and deep breathing can be used as a coping skill post--discharge to help manage symptoms of stress.   Goal Area(s) Addressed: Patient will practice using relaxation technique. Patient will identify a new coping skill.  Patient will follow multistep directions to reduce anxiety and stress.   Affect/Mood: Appropriate   Participation Level: Minimal    Clinical Observations/Individualized Feedback: Vicki Thomas was in and out of group multiple times. Pt was not present for more than half of group.   Plan: Continue to engage patient in RT group sessions 2-3x/week.   Deatrice Factor, LRT, CTRS 05/17/2023 4:37 PM

## 2023-05-17 NOTE — Discharge Summary (Signed)
 Physician Discharge Summary Note  Patient:  Vicki Thomas is an 63 y.o., female MRN:  [id number] DOB:  January 29, 1960 Patient phone:  [phone] (home)  Patient address:   [address] [place] [address],    Date of Admission:  05/05/2023 Date of Discharge: 05/17/23  Reason for Admission:  Vicki Thomas is a 63 y.o. female admitted: Presented to the EDfor 05/04/2023  5:23 PM for hallucinations, suicidal ideations. She carries the psychiatric diagnoses of schizophrenia, bipolar and has a past medical history of  UTI. Her current presentation of suicidal ideations, hallucinations, paranoia is most consistent with schizophrenia.Patient is admitted to [facility] unit with Q15 min safety monitoring. Multidisciplinary team approach is offered. Medication management; group/milieu therapy is offered.   Principal Problem: Schizophrenia [facility]) Discharge Diagnoses: Principal Problem:   Schizophrenia (HCC)   Past Psychiatric History: see h&p  Family Psychiatric  History: see h&p Social History:  Social History   Substance and Sexual Activity  Alcohol Use No     Social History   Substance and Sexual Activity  Drug Use No    Social History   Socioeconomic History   Marital status: Single    Spouse name: Not on file   Number of children: Not on file   Years of education: Not on file   Highest education level: Not on file  Occupational History   Not on file  Tobacco Use   Smoking status: Former    Current packs/day: 1.00    Average packs/day: 1 pack/day for 6.0 years (6.0 ttl pk-yrs)    Types: Cigarettes   Smokeless tobacco: Never  Vaping Use   Vaping status: Never Used  Substance and Sexual Activity   Alcohol use: No   Drug use: No   Sexual activity: Not Currently  Other Topics Concern   Not on file  Social History Narrative   Not on file   Social Drivers of Health   Financial Resource Strain: Not on file  Food Insecurity: Patient Declined (05/05/2023)    Hunger Vital Sign    Worried About Running Out of Food in the Last Year: Patient declined    Ran Out of Food in the Last Year: Patient declined  Transportation Needs: No Transportation Needs (05/05/2023)   PRAPARE - Administrator, Civil Service (Medical): No    Lack of Transportation (Non-Medical): No  Physical Activity: Not on file  Stress: Not on file  Social Connections: Unknown (05/30/2021)   Received from [facility]   Social Network    Social Network: Not on file   Past Medical History:  Past Medical History:  Diagnosis Date   Anxiety    Arthritis    Bipolar 1 disorder (HCC)    Chronic back pain    Chronic fatigue    Chronic headaches    Chronic pain    Fibromyalgia    Hiatal hernia    History of borderline personality disorder    Interstitial cystitis    Lymphedema    Panic disorder with agoraphobia    PTSD (post-traumatic stress disorder)     Past Surgical History:  Procedure Laterality Date   ABDOMINAL HYSTERECTOMY     APPENDECTOMY     BARIATRIC SURGERY     CHOLECYSTECTOMY     ESOPHAGUS SURGERY     FOOT SURGERY     HEMORRHOID SURGERY     HERNIA REPAIR     TONSILLECTOMY     Family History:  Family History  Problem  Relation Age of Onset   Alcohol abuse Mother    Bipolar disorder Father    Paranoid behavior Father    Alcohol abuse Father    Bipolar disorder Sister    Anxiety disorder Sister    Alcohol abuse Brother    Drug abuse Brother    Bipolar disorder Paternal Aunt    Schizophrenia Maternal Grandfather    Anxiety disorder Maternal Aunt     Hospital Course:  Vicki Thomas is a 63 y.o. female admitted: Presented to the EDfor 05/04/2023  5:23 PM for hallucinations, suicidal ideations. She carries the psychiatric diagnoses of schizophrenia, bipolar and has a past medical history of  UTI. Her current presentation of suicidal ideations, hallucinations, paranoia is most consistent with schizophrenia.Patient is admitted to [facility] unit with  Q15 min safety monitoring. Multidisciplinary team approach is offered. Medication management; group/milieu therapy is offered.   Physical Findings: AIMS:  , ,  ,  ,    CIWA:    COWS:        Psychiatric Specialty Exam:  Presentation  General Appearance:  Appropriate for Environment; Casual  Eye Contact: Fair  Speech: Clear and Coherent  Speech Volume: Normal    Mood and Affect  Mood: Euthymic  Affect: Appropriate   Thought Process  Thought Processes: Coherent  Descriptions of Associations:Intact  Orientation:Full (Time, Place and Person)  Thought Content:Logical  Hallucinations:Hallucinations: None  Ideas of Reference:None  Suicidal Thoughts:Suicidal Thoughts: No  Homicidal Thoughts:Homicidal Thoughts: No   Sensorium  Memory: Immediate Fair; Remote Fair  Judgment: Fair  Insight: Fair   Art therapist  Concentration: Fair  Attention Span: Fair  Recall: [employer] of Knowledge: Fair  Language: Fair   Psychomotor Activity  Psychomotor Activity: Psychomotor Activity: Normal  Musculoskeletal: Strength & Muscle Tone: {desc; muscle tone:32375} Gait & Station: {PE GAIT ED NGEX:[address]} Assets  Assets: Communication Skills; Desire for Improvement; Financial Resources/Insurance   Sleep  Sleep: Sleep: Fair    Physical Exam: Physical Exam ROS Blood pressure 105/65, pulse 72, temperature (!) 97.3 F (36.3 C), resp. rate 18, height 5\' 7"  (1.702 m), weight 67.4 kg, SpO2 97%. Body mass index is 23.26 kg/m.   Social History   Tobacco Use  Smoking Status Former   Current packs/day: 1.00   Average packs/day: 1 pack/day for 6.0 years (6.0 ttl pk-yrs)   Types: Cigarettes  Smokeless Tobacco Never   Tobacco Cessation:  {Discharge tobacco cessation prescription:304700209}   Blood Alcohol level:  Lab Results  Component Value Date   [facility] <10 05/04/2023   ETH <11 01/13/2014    Metabolic Disorder Labs:  Lab Results   Component Value Date   HGBA1C 5.0 05/13/2023   MPG 96.8 05/13/2023   No results found for: "PROLACTIN" Lab Results  Component Value Date   CHOL 124 05/13/2023   TRIG 64 05/13/2023   HDL 59 05/13/2023   CHOLHDL 2.1 05/13/2023   VLDL 13 05/13/2023   LDLCALC 52 05/13/2023    See Psychiatric Specialty Exam and Suicide Risk Assessment completed by Attending Physician prior to discharge.  Discharge destination:  Home  Is patient on multiple antipsychotic therapies at discharge:  No   Has Patient had three or more failed trials of antipsychotic monotherapy by history:  No  Recommended Plan for Multiple Antipsychotic Therapies: NA   Allergies as of 05/17/2023       Reactions   Bee Venom Swelling   Requires Epipen   Brexpiprazole  Other (See Comments)   Severe head pain  Erythromycin Shortness Of Breath   Throat closes, FLUSHING    Penicillins Shortness Of Breath   Throat closes, FLUSHING   Fluoxetine Hcl    Aggression   Haloperidol Decanoate    Facial mask symptoms   Lamotrigine    Vertigo Increased falls   Lurasidone Hcl    Feels like her head is going to explode Dizziness   Quetiapine Fumarate Swelling   Abilify [aripiprazole]    Aripiprazole    Ciprocin-fluocin-procin [fluocinolone Acetonide]    Fexofenadine Hcl    Gabapentin    Dizziness and excessive falling   Iodine    Lithium     Marcaine [bupivacaine Hcl]    Paroxetine Hcl    Pregabalin    Remeron [mirtazapine]    Seroquel [quetiapine Fumarate]    Tape    Zofran  Nausea Only   Zonisamide         Medication List     STOP taking these medications    ciprofloxacin  500 MG tablet Commonly known as: Cipro    guaiFENesin 100 MG/5ML liquid Commonly known as: ROBITUSSIN   NYQUIL MULTI-SYMPTOM PO   OVER THE COUNTER MEDICATION   Potassium 99 MG Tabs       TAKE these medications      Indication  aspirin-sod bicarb-citric acid 325 MG Tbef tablet Commonly known as: ALKA-SELTZER Take 325 mg  by mouth every 6 (six) hours as needed.    bisacodyl  5 MG EC tablet Commonly known as: DULCOLAX Take 5 mg by mouth daily as needed for moderate constipation.    clonazePAM  0.5 MG tablet Commonly known as: KLONOPIN  Take 1 tablet (0.5 mg total) by mouth 2 (two) times daily.    clotrimazole  1 % vaginal cream Commonly known as: GYNE-LOTRIMIN  Place 1 Applicatorful vaginally at bedtime.    ibuprofen  200 MG tablet Commonly known as: ADVIL  Take 400 mg by mouth every 6 (six) hours as needed for moderate pain (pain score 4-6).    polyethylene glycol 17 g packet Commonly known as: MIRALAX / GLYCOLAX Take 17 g by mouth daily.    potassium chloride  10 MEQ tablet Commonly known as: KLOR-CON  Take 1 tablet (10 mEq total) by mouth daily.    risperiDONE  0.5 MG disintegrating tablet Commonly known as: RISPERDAL  M-TABS Take 1 tablet (0.5 mg total) by mouth daily.    risperiDONE  1 MG tablet Commonly known as: RISPERDAL  Take 1 tablet (1 mg total) by mouth at bedtime.    simethicone  125 MG chewable tablet Commonly known as: MYLICON Chew 125 mg by mouth every 6 (six) hours as needed for flatulence.    Zinc Oxide 16 % Oint Apply topically.         Follow-up Information     [facility]. Go on 05/26/2023.   Why: Per your request, I have scheduled a primary care appointment for 05/26/23 at  9:20 AM. Please arrive at the office at 9:05 AM. Please bring your insurance and ID. If there is a copay it will be collected at the time of your appointment. If you take any home medications, please be prepared to talk about those with the provider. You are scheduled with Blondie Burke, NP Contact information: [address] [place]  [address] [phone]                Follow-up recommendations:  Activity:  As tolerated    Signed: Delana Manganello, MD 05/17/2023, 9:13 AM

## 2023-05-19 ENCOUNTER — Ambulatory Visit: Payer: Self-pay

## 2023-05-19 NOTE — Telephone Encounter (Signed)
 Copied from CRM [id number]. Topic: Appointments - Appointment Scheduling >> May 19, 2023  1:46 PM Rosaria Common wrote: Patient/patient representative is calling to schedule an appointment. Refer to attachments for appointment information. Pt called to verify if any earlier appts with Dr. Van Gelinas is available. None at this time, however pt has been added to waitlist. Pt is experiencing a current schizophrenic episode. Warm transfer to nurse.   Chief Complaint: Pt. States having increased anxiety."I was just released from the hospital and I need to talk to Dr. Belvie Boyers. Don't you have her cell phone?"  Symptoms: Above, asking for medication doses to be changed. Frequency: Today Pertinent Negatives: Patient denies thoughts of self harm Disposition: [x] ED /[] Urgent Care (no appt availability in office) / [] Appointment(In office/virtual)/ []  [place] [facility]/ [] Home Care/ [] Refused Recommended Disposition /[] [place] [facility]/ []  Follow-up with PCP Additional Notes: Pt. Refuses ED, disconnected call. Called pt. Back and voice mailbox is unavailable.  Reason for Disposition  [1] Panic attack symptoms (diagnosed in the past) AND [2] not better with usual treatment, reassurance, or Care Advice  Answer Assessment - Initial Assessment Questions 1. CONCERN: "Did anything happen that prompted you to call today?"      anxiety 2. ANXIETY SYMPTOMS: "Can you describe how you (your loved one; patient) have been feeling?" (e.g., tense, restless, panicky, anxious, keyed up, overwhelmed, sense of impending doom).      Anxiety 3. ONSET: "How long have you been feeling this way?" (e.g., hours, days, weeks)     This week 4. SEVERITY: "How would you rate the level of anxiety?" (e.g., 0 - 10; or mild, moderate, severe).     severe 5. FUNCTIONAL IMPAIRMENT: "How have these feelings affected your ability to do daily activities?" "Have you had more difficulty than usual doing your normal daily activities?" (e.g.,  getting better, same, worse; self-care, school, work, interactions)     Yes 6. HISTORY: "Have you felt this way before?" "Have you ever been diagnosed with an anxiety problem in the past?" (e.g., generalized anxiety disorder, panic attacks, PTSD). If Yes, ask: "How was this problem treated?" (e.g., medicines, counseling, etc.)     Yes 7. RISK OF HARM - SUICIDAL IDEATION: "Do you ever have thoughts of hurting or killing yourself?" If Yes, ask:  "Do you have these feelings now?" "Do you have a plan on how you would do this?"     No 8. TREATMENT:  "What has been done so far to treat this anxiety?" (e.g., medicines, relaxation strategies). "What has helped?"     Hospital 9. TREATMENT - THERAPIST: "Do you have a counselor or therapist? Name?"     No 10. POTENTIAL TRIGGERS: "Do you drink caffeinated beverages (e.g., coffee, colas, teas), and how much daily?" "Do you drink alcohol or use any drugs?" "Have you started any new medicines recently?"       No 11. PATIENT SUPPORT: "Who is with you now?" "Who do you live with?" "Do you have family or friends who you can talk to?"        Daughter 12. OTHER SYMPTOMS: "Do you have any other symptoms?" (e.g., feeling depressed, trouble concentrating, trouble sleeping, trouble breathing, palpitations or fast heartbeat, chest pain, sweating, nausea, or diarrhea)       Yes 13. PREGNANCY: "Is there any chance you are pregnant?" "When was your last menstrual period?"       no  Protocols used: Anxiety and Panic Attack-A-AH

## 2023-05-26 ENCOUNTER — Telehealth: Payer: Self-pay

## 2023-05-26 ENCOUNTER — Ambulatory Visit: Payer: Self-pay

## 2023-05-26 DIAGNOSIS — F4310 Post-traumatic stress disorder, unspecified: Secondary | ICD-10-CM

## 2023-05-26 DIAGNOSIS — F200 Paranoid schizophrenia: Secondary | ICD-10-CM | POA: Diagnosis not present

## 2023-05-26 DIAGNOSIS — G8929 Other chronic pain: Secondary | ICD-10-CM | POA: Diagnosis not present

## 2023-05-26 DIAGNOSIS — M5441 Lumbago with sciatica, right side: Secondary | ICD-10-CM | POA: Diagnosis not present

## 2023-05-26 DIAGNOSIS — M792 Neuralgia and neuritis, unspecified: Secondary | ICD-10-CM

## 2023-05-26 DIAGNOSIS — M797 Fibromyalgia: Secondary | ICD-10-CM | POA: Diagnosis not present

## 2023-05-26 MED ORDER — ONDANSETRON 8 MG PO TBDP: 8.0000 mg | ORAL_TABLET | Freq: Three times a day (TID) | ORAL | 2 refills | Status: DC | PRN

## 2023-05-26 MED ORDER — RISPERIDONE 1 MG PO TABS: 1.0000 mg | ORAL_TABLET | Freq: Every day | ORAL | 0 refills | Status: DC

## 2023-05-26 MED ORDER — RISPERIDONE 0.5 MG PO TBDP: 0.5000 mg | ORAL_TABLET | Freq: Every day | ORAL | 0 refills | Status: DC

## 2023-05-26 MED ORDER — CLONAZEPAM 0.5 MG PO TBDP: 0.5000 mg | ORAL_TABLET | Freq: Two times a day (BID) | ORAL | 0 refills | Status: DC

## 2023-05-26 NOTE — Telephone Encounter (Signed)
 PCS forms  Noted Copied Sleeved(put in provider box)

## 2023-05-26 NOTE — Telephone Encounter (Signed)
 Copied from CRM [id number]. Topic: Clinical - Medical Advice >> May 26, 2023 10:55 AM Dorthula Gavel H wrote: Reason for CRM: [facility] called in to give provider information on where this pt can go to get her injection. [facility] may be able to assit this pt on admistering the injection. The pt was referred to [facility] but they can't adminster injections till pt is established. The phone number to Kenny Peals is [phone] and address is [address] [place].

## 2023-05-26 NOTE — Progress Notes (Unsigned)
 New Patient Office Visit  Subjective    Patient ID: Eutha Corrado, female    DOB: May 27, 1960  Age: 63 y.o. MRN: [id number]  CC:  Chief Complaint  Patient presents with   Establish Care    Pts daughter states "She needs a pain management refferal and [facility] outpatient physical therapy refferal" Pts daughter states she "Needs a home health aid" Pt also has been diagnosed with schizophrenia by  reigonal"     HPI Saki Gasparyan presents to establish care Pt was admitted to inpatient [facility] from 05/05/23 - 05/17/23 (12 days).  See discharge summary for this stay below:   Hospital Course:  Shirin Demke is a 63 y.o. female admitted: Presented to the EDfor 05/04/2023  5:23 PM for hallucinations, suicidal ideations. She carries the psychiatric diagnoses of schizophrenia, bipolar and has a past medical history of  UTI. Her current presentation of suicidal ideations, hallucinations, paranoia is most consistent with schizophrenia.Patient is admitted to [facility] unit with Q15 min safety monitoring. Multidisciplinary team approach is offered. Medication management; group/milieu therapy is offered.  On admission patient is noted to display significant anxiety, some paranoia reporting hallucinations.  She was started on Zyprexa  5 mg nightly from the emergency room which she tolerated very well.  Zyprexa  was increased to 7.5 mg nightly with no response to hallucinations that she is endorsing.  Patient displayed significant somatic symptoms and is very particular about the over-the-counter medications/creams.  She was very irritable when she does not get something she demands.  Provider worked with the daughter and updating the treatment plan.  Patient reportedly has 22 allergies including all antidepressants.  Over the period of time patient wanted to get on a medication that comes in LAI form.  After thorough review of her allergies Risperdal  is noted to not be in the allergy section.  Zyprexa  was  discontinued and patient was started on Risperdal  0.5 mg in the a.m. and 1 mg nightly.  She was also started on Klonopin  0.5 mg twice daily.  Patient Risperdal  was titrated up to 0.5 mg every morning and 2 mg nightly and gradually transition to Risperdal  Consta 25 mg IM LAI every 2 weeks.  Uzedy  was not available in the inpatient pharmacy.  Provider and daughter was educated about switching to LAI Uzedy  as outpatient.  Patient tolerated medications very well and displayed safe behaviors throughout the hospitalization.  On the day of discharge patient consistently denied SI/HI/intent/plan.  Her hallucinations improved significantly and her paranoia improved 2.  She remains future oriented and is willing to participate in outpatient mental health services.  Patient is discharged home with her daughter.  Name was called and it   Outpatient Encounter Medications as of 05/26/2023  Medication Sig   aspirin-sod bicarb-citric acid (ALKA-SELTZER) 325 MG TBEF tablet Take 325 mg by mouth every 6 (six) hours as needed.   bisacodyl  (DULCOLAX) 5 MG EC tablet Take 5 mg by mouth daily as needed for moderate constipation.   ciprofloxacin  (CIPRO ) 500 MG tablet Take 500 mg by mouth 2 (two) times daily.   clonazePAM  (KLONOPIN ) 0.5 MG tablet Take 1 tablet (0.5 mg total) by mouth 2 (two) times daily.   clotrimazole  (GYNE-LOTRIMIN ) 1 % vaginal cream Place 1 Applicatorful vaginally at bedtime.   ibuprofen  (ADVIL ) 200 MG tablet Take 400 mg by mouth every 6 (six) hours as needed for moderate pain (pain score 4-6).   ondansetron  (ZOFRAN -ODT) 8 MG disintegrating tablet Take 8 mg by mouth every 8 (eight)  hours as needed for nausea or vomiting.   polyethylene glycol (MIRALAX / GLYCOLAX) 17 g packet Take 17 g by mouth daily.   potassium chloride  (K-DUR) 10 MEQ tablet Take 1 tablet (10 mEq total) by mouth daily.   risperiDONE  (RISPERDAL  M-TABS) 0.5 MG disintegrating tablet Take 1 tablet (0.5 mg total) by mouth daily.   risperiDONE   (RISPERDAL ) 1 MG tablet Take 1 tablet (1 mg total) by mouth at bedtime.   simethicone  (MYLICON) 125 MG chewable tablet Chew 125 mg by mouth every 6 (six) hours as needed for flatulence.   Zinc Oxide 16 % OINT Apply topically.   No facility-administered encounter medications on file as of 05/26/2023.    Past Medical History:  Diagnosis Date   Anxiety    Arthritis    Bipolar 1 disorder (HCC)    Chronic back pain    Chronic fatigue    Chronic headaches    Chronic pain    Fibromyalgia    Hiatal hernia    History of borderline personality disorder    Interstitial cystitis    Lymphedema    Panic disorder with agoraphobia    PTSD (post-traumatic stress disorder)     Past Surgical History:  Procedure Laterality Date   ABDOMINAL HYSTERECTOMY     APPENDECTOMY     BARIATRIC SURGERY     CHOLECYSTECTOMY     ESOPHAGUS SURGERY     FOOT SURGERY     HEMORRHOID SURGERY     HERNIA REPAIR     TONSILLECTOMY      Family History  Problem Relation Age of Onset   Alcohol abuse Mother    Bipolar disorder Father    Paranoid behavior Father    Alcohol abuse Father    Bipolar disorder Sister    Anxiety disorder Sister    Alcohol abuse Brother    Drug abuse Brother    Bipolar disorder Paternal Aunt    Schizophrenia Maternal Grandfather    Anxiety disorder Maternal Aunt     Social History   Socioeconomic History   Marital status: Single    Spouse name: Not on file   Number of children: Not on file   Years of education: Not on file   Highest education level: Not on file  Occupational History   Not on file  Tobacco Use   Smoking status: Former    Current packs/day: 1.00    Average packs/day: 1 pack/day for 6.0 years (6.0 ttl pk-yrs)    Types: Cigarettes   Smokeless tobacco: Never  Vaping Use   Vaping status: Never Used  Substance and Sexual Activity   Alcohol use: No   Drug use: Yes    Types: Marijuana   Sexual activity: Not Currently  Other Topics Concern   Not on file   Social History Narrative   Not on file   Social Drivers of Health   Financial Resource Strain: Not on file  Food Insecurity: Patient Declined (05/05/2023)   Hunger Vital Sign    Worried About Running Out of Food in the Last Year: Patient declined    Ran Out of Food in the Last Year: Patient declined  Transportation Needs: No Transportation Needs (05/05/2023)   PRAPARE - Administrator, Civil Service (Medical): No    Lack of Transportation (Non-Medical): No  Physical Activity: Not on file  Stress: Not on file  Social Connections: Unknown (05/30/2021)   [facility] from [facility]   Social Network    Social Network: Not  on file  Intimate Partner Violence: Not At Risk (05/05/2023)   Humiliation, Afraid, Rape, and Kick questionnaire    Fear of Current or Ex-Partner: No    Emotionally Abused: No    Physically Abused: No    Sexually Abused: No    ROS      Objective    There were no vitals taken for this visit.  Physical Exam  {Labs (Optional):23779}    Assessment & Plan:   Problem List Items Addressed This Visit   None   No follow-ups on file.   Alison Irvine, FNP

## 2023-05-30 ENCOUNTER — Telehealth: Payer: Self-pay

## 2023-05-30 NOTE — Telephone Encounter (Signed)
 Copied from CRM [id number]. Topic: Clinical - Prescription Issue >> May 30, 2023  9:31 AM Bridgette Campus T wrote: Reason for CRM: clonazePAM  (KLONOPIN ) 0.5 MG disintegrating tablet - pharmacy does not have disintegrating tablets, only regular tablets, need to know if they replace with this, please call [facility] [phone]

## 2023-05-30 NOTE — Telephone Encounter (Signed)
 Left message with daughter to call back.

## 2023-05-30 NOTE — Telephone Encounter (Signed)
 Pharmacy advised

## 2023-06-01 NOTE — Telephone Encounter (Signed)
 noted

## 2023-06-06 ENCOUNTER — Ambulatory Visit: Payer: Self-pay

## 2023-06-06 NOTE — Telephone Encounter (Signed)
 Copied from CRM [id number]. Topic: Clinical - Red Word Triage >> Jun 06, 2023  3:17 PM Carlatta H wrote: [facility] that prompted transfer to Nurse Triage: Patient is bleeding from rectum//Bleeding for months   Chief Complaint: Rectal bleeding Symptoms: above Frequency: 3 years Pertinent Negatives: Patient denies  Disposition: [] ED /[] Urgent Care (no appt availability in office) / [x] Appointment(In office/virtual)/ []  [facility]/ [] Home Care/ [] Refused Recommended Disposition /[] [place] [facility]/ []  Follow-up with PCP Additional Notes: Having transportation issues.  Reason for Disposition  MODERATE rectal bleeding (small blood clots, passing blood without stool, or toilet water turns red)  Answer Assessment - Initial Assessment Questions 1. APPEARANCE of BLOOD: "What color is it?" "Is it passed separately, on the surface of the stool, or mixed in with the stool?"      Bright red 2. AMOUNT: "How much blood was passed?"      large 3. FREQUENCY: "How many times has blood been passed with the stools?"      Many 4. ONSET: "When was the blood first seen in the stools?" (Days or weeks)      3 years 5. DIARRHEA: "Is there also some diarrhea?" If Yes, ask: "How many diarrhea stools in the past 24 hours?"      3 years 6. CONSTIPATION: "Do you have constipation?" If Yes, ask: "How bad is it?"     Sometimes 7. RECURRENT SYMPTOMS: "Have you had blood in your stools before?" If Yes, ask: "When was the last time?" and "What happened that time?"      Yes 8. BLOOD THINNERS: "Do you take any blood thinners?" (e.g., Coumadin/warfarin, Pradaxa/dabigatran, aspirin)     No 9. OTHER SYMPTOMS: "Do you have any other symptoms?"  (e.g., abdomen pain, vomiting, dizziness, fever)     No 10. PREGNANCY: "Is there any chance you are pregnant?" "When was your last menstrual period?"       No  Protocols used: Rectal Bleeding-A-AH

## 2023-06-08 ENCOUNTER — Other Ambulatory Visit: Payer: Self-pay

## 2023-06-08 ENCOUNTER — Ambulatory Visit (INDEPENDENT_AMBULATORY_CARE_PROVIDER_SITE_OTHER): Payer: Self-pay

## 2023-06-08 ENCOUNTER — Encounter: Payer: Self-pay | Admitting: *Deleted

## 2023-06-08 VITALS — BP 115/65 | HR 99 | Ht 67.0 in | Wt 171.0 lb

## 2023-06-08 DIAGNOSIS — F200 Paranoid schizophrenia: Secondary | ICD-10-CM

## 2023-06-08 DIAGNOSIS — F4310 Post-traumatic stress disorder, unspecified: Secondary | ICD-10-CM

## 2023-06-08 DIAGNOSIS — K625 Hemorrhage of anus and rectum: Secondary | ICD-10-CM

## 2023-06-08 DIAGNOSIS — F5104 Psychophysiologic insomnia: Secondary | ICD-10-CM | POA: Diagnosis not present

## 2023-06-08 MED ORDER — TRAZODONE HCL 50 MG PO TABS: 50.0000 mg | ORAL_TABLET | Freq: Every evening | ORAL | 2 refills | Status: DC | PRN

## 2023-06-08 MED ORDER — RISPERIDONE 1 MG PO TABS: 1.0000 mg | ORAL_TABLET | Freq: Two times a day (BID) | ORAL | 0 refills | Status: DC

## 2023-06-08 NOTE — Progress Notes (Signed)
 Acute Office Visit  Subjective:     Patient ID: Vicki Thomas, female    DOB: 05/17/1960, 63 y.o.   MRN: [id number]  Chief Complaint  Patient presents with   Medical Management of Chronic Issues    Pt states "Here for rectal bleeding been going on off an on for years, pts states also has pictures" Pt also needs a "referral to strategic interventions"    HPI Patient is in today for rectal bleeding and home health referral for specialty pharmacy so that she may receive biweekly injections   Review of Systems  Constitutional: Negative.   HENT: Negative.    Eyes: Negative.   Respiratory: Negative.    Cardiovascular: Negative.   Gastrointestinal:  Positive for blood in stool.  Genitourinary: Negative.   Musculoskeletal: Negative.   Skin: Negative.   Neurological: Negative.   Psychiatric/Behavioral:  Positive for depression. Negative for hallucinations and suicidal ideas. The patient is nervous/anxious and has insomnia.         Objective:    BP 115/65   Pulse 99   Ht 5\' 7"  (1.702 m)   Wt 171 lb (77.6 kg)   SpO2 96%   BMI 26.78 kg/m    Physical Exam Vitals and nursing note reviewed.  Constitutional:      Appearance: Normal appearance.  Eyes:     Extraocular Movements: Extraocular movements intact.     Pupils: Pupils are equal, round, and reactive to light.  Cardiovascular:     Rate and Rhythm: Normal rate and regular rhythm.  Pulmonary:     Effort: Pulmonary effort is normal.     Breath sounds: Normal breath sounds.  Musculoskeletal:     Cervical back: Normal range of motion and neck supple.  Neurological:     Mental Status: She is alert and oriented to person, place, and time.  Psychiatric:        Mood and Affect: Mood normal.        Thought Content: Thought content normal.        Judgment: Judgment normal.     No results found for any visits on 06/08/23.      Assessment & Plan:   Problem List Items Addressed This Visit       Digestive   Bright  red rectal bleeding - Primary   Ongoing for years.  Refer to GI for further evaluation and treatment.      Relevant Orders   Ambulatory referral to Gastroenterology     Other   Schizophrenia [facility])   Referral placed for specialty pharmacy to administer her you said he every 2 weeks.  I have agreed to refill the rest per dial until she can get this started      Relevant Medications   risperiDONE  (RISPERDAL ) 1 MG tablet   Other Relevant Orders   Ambulatory referral to Home Health   Psychophysiological insomnia   Add trazodone  for ongoing insomnia      Relevant Medications   traZODone  (DESYREL ) 50 MG tablet    Meds ordered this encounter  Medications   risperiDONE  (RISPERDAL ) 1 MG tablet    Sig: Take 1 tablet (1 mg total) by mouth 2 (two) times daily.    Dispense:  60 tablet    Refill:  0   traZODone  (DESYREL ) 50 MG tablet    Sig: Take 1-2 tablets (50-100 mg total) by mouth at bedtime as needed for sleep.    Dispense:  60 tablet    Refill:  2  Return in about 3 months (around 09/08/2023).  Alison Irvine, FNP

## 2023-06-09 ENCOUNTER — Telehealth: Payer: Self-pay

## 2023-06-09 NOTE — Telephone Encounter (Signed)
 Noted pharmacy added to chart

## 2023-06-09 NOTE — Telephone Encounter (Signed)
 Copied from CRM [id number]. Topic: Clinical - Prescription Issue >> Jun 08, 2023  3:22 PM Elle L wrote: Reason for CRM: The patient is requesting for her future prescriptions to be sent to [facility]. She has already picked up her current prescriptions. I have added this Pharmacy to her chart.

## 2023-06-10 ENCOUNTER — Telehealth: Payer: Self-pay

## 2023-06-10 ENCOUNTER — Other Ambulatory Visit: Payer: Self-pay

## 2023-06-10 ENCOUNTER — Ambulatory Visit: Payer: Self-pay

## 2023-06-10 ENCOUNTER — Encounter: Payer: Self-pay | Admitting: Physical Medicine and Rehabilitation

## 2023-06-10 DIAGNOSIS — F200 Paranoid schizophrenia: Secondary | ICD-10-CM

## 2023-06-10 MED ORDER — CLONAZEPAM 0.5 MG PO TBDP
0.5000 mg | ORAL_TABLET | Freq: Two times a day (BID) | ORAL | 2 refills | Status: DC
Start: 2023-06-10 — End: 2023-06-16

## 2023-06-10 NOTE — Telephone Encounter (Signed)
 Copied from CRM [id number]. Topic: Clinical - Prescription Issue >> Jun 09, 2023 11:27 AM Dorthula Gavel H wrote: Reason for CRM: PT IS REQUESTING THAT clonazePAM  (KLONOPIN ) 0.5 MG disintegrating tablet BE SENT through [facility] mail meds.. Fax # [phone]

## 2023-06-13 DIAGNOSIS — F5104 Psychophysiologic insomnia: Secondary | ICD-10-CM | POA: Insufficient documentation

## 2023-06-13 DIAGNOSIS — K625 Hemorrhage of anus and rectum: Secondary | ICD-10-CM | POA: Insufficient documentation

## 2023-06-13 NOTE — Assessment & Plan Note (Signed)
 Referral placed for specialty pharmacy to administer her you said he every 2 weeks.  I have agreed to refill the rest per dial until she can get this started

## 2023-06-13 NOTE — Assessment & Plan Note (Signed)
 Add trazodone  for ongoing insomnia

## 2023-06-13 NOTE — Assessment & Plan Note (Signed)
 Ongoing for years.  Refer to GI for further evaluation and treatment.

## 2023-06-14 ENCOUNTER — Ambulatory Visit: Payer: Self-pay

## 2023-06-14 NOTE — Telephone Encounter (Signed)
 No triage: Vicki Thomas from [facility] called to see dosage of Risperidone . Apothecary had two different dosages listed. RN read from 5/14 office visit which updated the dosage. Pt to take 1mg  tab BID. Vicki Thomas is updated the medication in her system.  No further concerns or questions.          Copied from CRM [id number]. Topic: Clinical - Medication Question >> Jun 14, 2023  9:10 AM Vicki Thomas wrote: Reason for CRM: Vicki Thomas with [facility] is calling in because she needs to verify which dose patient is supposed to be on. Vicki Thomas says there are multiple dosages showing and she wants to know which is correct. Reason for Disposition  Health Information question, no triage required and triager able to answer question  Answer Assessment - Initial Assessment Questions 1. REASON FOR CALL or QUESTION: "What is your reason for calling today?" or "How can I best help you?" or "What question do you have that I can help answer?"     Apothecary needs updated dosage on Risperidone .  Protocols used: Information Only Call - No Triage-A-AH

## 2023-06-14 NOTE — Telephone Encounter (Signed)
 Not our patient

## 2023-06-15 NOTE — Progress Notes (Signed)
 GI Office Note    Referring Provider: Alison Irvine, FNP Primary Care Physician:  Alison Irvine, FNP  Primary Gastroenterologist: Rolando Cliche. Mordechai April, DO  Chief Complaint   Chief Complaint  Patient presents with   Follow-up    Follow up bright red stools for years   New Patient (Initial Visit)   History of Present Illness   Vicki Thomas is a 63 y.o. female presenting today at the request of Alison Irvine, FNP for rectal bleeding.   Last seen by [facility] for bloating, constipation, elevated alk phos and dilated CBD. Retrieved from [facility] visit note: Upper endoscopy was performed on July 18, 2013 by Dr. 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 Small bowel biopsies did not show evidence of celiac disease, and gastric biopsies did not show evidence of Helicobacter pylori. She was found to have an isolated elevated alkaline phosphatase of 262 in addition to an elevated GGT of 283. Subsequent serologies including ANA, antimitochondrial antibody, and actin antibody were normal. MRCP was performed and revealed intra-and extrahepatic biliary ductal dilation in the post-cholecystectomy state. The common bile duct was as large as 16 mm. There were no stones. The pancreas appeared normal. Colonoscopy in 2013 but is unsure where that was performed. During this visit she reported taking Amitiza  for constipation which improved constipation and reported chronic opioid use but having 2-3 BM daily. Reported that she will regurgitate small amounts of emesis one to 2 times per day without warning. Taking nexium  daily. Labs ordered.   MRI 2015 IMPRESSION:  Intra-and extra hepatic biliary ductal dilatation with history of  cholecystectomy. No biliary stones. No change from prior CT   :  Small bowel follow through study July 2015 IMPRESSION:  Unremarkable small bowel follow-through series.  Surgical history includes cholecystectomy,  hemorrhoidectomy, hernia repair, appendectomy and hysterectomy.   Today:  Bowel habits - struggles with constipation and has to use sitz bath to help with the bleeding and help with stimulation. She states she has taken a picture of her rectum and she felt like it may be a [polyp (she states they look like empty polyp shells (transluscent).  and she reports several of them and they come and go, about every 2 weeks. Takes miralax and 3 gentle laxatives about every 2 weeks to keep things going. Dos not go in between doing that.   She does have hemorrhoids and uses zinc oxide for that and help with discomfort  Reports about 3 years of rectal bleeding.  Has been house bound for about 8 years (reports she was seen inpatient mental health) reports agoraphobia. States taken off risperidone  and going to be tacking a new medication and also took her off clonazepam  - they were causing nausea. Nauseas and dizzy today. Denies frequent nausea and dizziness prior to those medications. Has had intermittent nausea in the past. She reports zofran  usually works but this most recent nausea secondary to medication has been overpowering it.   Does report acid reflux issues - fixed hiatal hernia and nissen in her 30s - does not take any medication up until recently. She reports issues with fullness in the past. Does have some mild indigestion with food. Denies overt dysphagia. Will take tums as needed. [place] food etc will be more bothersome for her.   History of colon polyps in her younger sister and her mother.  Denies family history of colon cancer.  Does also report family history of constipation.  Prior  EGD - around the time of her surgery Prior colonoscopy - 2013  Wt Readings from Last 3 Encounters:  06/16/23 163 lb 9.6 oz (74.2 kg)  06/08/23 171 lb (77.6 kg)  05/04/23 177 lb 14.6 oz (80.7 kg)    Current Outpatient Medications  Medication Sig Dispense Refill   aspirin-sod bicarb-citric acid  (ALKA-SELTZER) 325 MG TBEF tablet Take 325 mg by mouth every 6 (six) hours as needed.     bisacodyl  (DULCOLAX) 5 MG EC tablet Take 5 mg by mouth daily as needed for moderate constipation.     clotrimazole  (GYNE-LOTRIMIN ) 1 % vaginal cream Place 1 Applicatorful vaginally at bedtime. 45 g 0   ibuprofen  (ADVIL ) 200 MG tablet Take 400 mg by mouth every 6 (six) hours as needed for moderate pain (pain score 4-6).     linaclotide (LINZESS) 145 MCG CAPS capsule Take 1 capsule (145 mcg total) by mouth daily before breakfast. 30 capsule 3   ondansetron  (ZOFRAN -ODT) 8 MG disintegrating tablet Take 1 tablet (8 mg total) by mouth every 8 (eight) hours as needed for nausea or vomiting. 20 tablet 2   polyethylene glycol (MIRALAX / GLYCOLAX) 17 g packet Take 17 g by mouth daily.     potassium chloride  (K-DUR) 10 MEQ tablet Take 1 tablet (10 mEq total) by mouth daily. 10 tablet 0   simethicone  (MYLICON) 125 MG chewable tablet Chew 125 mg by mouth every 6 (six) hours as needed for flatulence.     traZODone  (DESYREL ) 50 MG tablet Take 1-2 tablets (50-100 mg total) by mouth at bedtime as needed for sleep. 60 tablet 2   Zinc Oxide 16 % OINT Apply topically.     No current facility-administered medications for this visit.    Past Medical History:  Diagnosis Date   Anxiety    Arthritis    Bipolar 1 disorder (HCC)    Chronic back pain    Chronic fatigue    Chronic headaches    Chronic pain    Fibromyalgia    Hiatal hernia    History of borderline personality disorder    Interstitial cystitis    Lymphedema    Panic disorder with agoraphobia    PTSD (post-traumatic stress disorder)     Past Surgical History:  Procedure Laterality Date   ABDOMINAL HYSTERECTOMY     APPENDECTOMY     BARIATRIC SURGERY     CHOLECYSTECTOMY     ESOPHAGUS SURGERY     FOOT SURGERY     HEMORRHOID SURGERY     HERNIA REPAIR     TONSILLECTOMY      Family History  Problem Relation Age of Onset   Alcohol abuse Mother     Colon polyps Mother    Bipolar disorder Father    Paranoid behavior Father    Alcohol abuse Father    Bipolar disorder Sister    Anxiety disorder Sister    Alcohol abuse Brother    Drug abuse Brother    Schizophrenia Maternal Grandfather    Anxiety disorder Maternal Aunt    Bipolar disorder Paternal Aunt    Colon polyps Sibling     Allergies as of 06/16/2023 - Review Complete 06/16/2023  Allergen Reaction Noted   Bee venom Swelling 02/15/2011   Brexpiprazole  Other (See Comments) 03/01/2014   Erythromycin Shortness Of Breath 02/15/2011   Penicillins Shortness Of Breath 02/15/2011   Fluoxetine hcl  10/31/2012   Haloperidol decanoate  10/31/2012   Lamotrigine  01/14/2015   Lurasidone hcl  12/19/2013  Quetiapine fumarate Swelling 10/31/2012   Abilify [aripiprazole]  12/17/2011   Aripiprazole  02/15/2011   Ciprocin-fluocin-procin [fluocinolone acetonide]  02/15/2011   Fexofenadine hcl  02/15/2011   Gabapentin  11/13/2012   Iodine  02/15/2011   Lithium   02/15/2011   Marcaine [bupivacaine hcl]  12/17/2011   Paroxetine hcl  02/15/2011   Pregabalin  02/15/2011   Remeron [mirtazapine]  02/15/2011   Seroquel [quetiapine fumarate]  12/17/2011   Tape  02/15/2011   Zofran  Nausea Only 02/15/2011   Zonisamide  02/15/2011    Social History   Socioeconomic History   Marital status: Single    Spouse name: Not on file   Number of children: Not on file   Years of education: Not on file   Highest education level: Not on file  Occupational History   Not on file  Tobacco Use   Smoking status: Former    Current packs/day: 1.00    Average packs/day: 1 pack/day for 6.0 years (6.0 ttl pk-yrs)    Types: Cigarettes   Smokeless tobacco: Never  Vaping Use   Vaping status: Never Used  Substance and Sexual Activity   Alcohol use: No   Drug use: Yes    Types: Marijuana   Sexual activity: Not Currently  Other Topics Concern   Not on file  Social History Narrative   Not on file    Social Drivers of Health   Financial Resource Strain: Not on file  Food Insecurity: Patient Declined (05/05/2023)   Hunger Vital Sign    Worried About Running Out of Food in the Last Year: Patient declined    Ran Out of Food in the Last Year: Patient declined  Transportation Needs: No Transportation Needs (05/05/2023)   PRAPARE - Administrator, Civil Service (Medical): No    Lack of Transportation (Non-Medical): No  Physical Activity: Not on file  Stress: Not on file  Social Connections: Unknown (05/30/2021)   [facility] from [facility]   Social Network    Social Network: Not on file  Intimate Partner Violence: Not At Risk (05/05/2023)   Humiliation, Afraid, Rape, and Kick questionnaire    Fear of Current or Ex-Partner: No    Emotionally Abused: No    Physically Abused: No    Sexually Abused: No     Review of Systems   Gen: Denies any fever, chills, fatigue, weight loss, lack of appetite.  CV: Denies chest pain, heart palpitations, peripheral edema, syncope.  Resp: Denies shortness of breath at rest or with exertion. Denies wheezing or cough.  GI: see HPI GU : Denies urinary burning, urinary frequency, urinary hesitancy MS: Denies joint pain, muscle weakness, cramps, or limitation of movement.  Derm: Denies rash, itching, dry skin Psych: Denies depression, anxiety, memory loss, and confusion Heme: Denies bruising, bleeding, and enlarged lymph nodes.  Physical Exam   BP 115/68   Pulse 69   Temp 98.3 F (36.8 C)   Ht 5\' 7"  (1.702 m)   Wt 163 lb 9.6 oz (74.2 kg)   BMI 25.62 kg/m   General:   Alert and oriented. Pleasant and cooperative. Well-nourished and well-developed.  Head:  Normocephalic and atraumatic. Eyes:  Without icterus, sclera clear and conjunctiva pink.  Ears:  Normal auditory acuity. Mouth:  No deformity or lesions, oral mucosa pink.  Lungs:  Clear to auscultation bilaterally. No wheezes, rales, or rhonchi. No distress.  Heart:  S1, S2  present without murmurs appreciated.  Abdomen:  +BS, soft, non-tender and non-distended.  No HSM noted. No guarding or rebound. No masses appreciated.  Rectal:  Deferred  Msk:  Symmetrical without gross deformities. Normal posture. Extremities:  Without edema. Neurologic:  Alert and  oriented x4;  grossly normal neurologically. Skin:  Intact without significant lesions or rashes. Psych:  Alert and cooperative. Normal mood and affect.  Assessment   Vicki Thomas is a 63 y.o. 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  Has history of hemorrhoidectomy in the past but has had returns.  She initially thought there were potentially polyps however bleeding is usually only occurring with her bowel movements, observed drops of blood on washcloth as well as in the floor with standing when this occurs.  Most recent labs without any significant anemia.  Last colonoscopy 2013.  Rectal exam today without any overt bleeding noted, mild grade 1/grade 2 hemorrhoids present and evidence of old external hemorrhoids.  No rectal mass.  Recommended avoiding straining, using sitz bath and over-the-counter hemorrhoid cream as needed.  Discussed potential hemorrhoid banding, may be a good option for her in the future.  Constipation, bloating: Suffers from chronic constipation with 1 bowel movement every 2 weeks.  Also complains of bloating and suspect some of her nausea secondary to her constipation.  Has to use MiraLAX and multiple other gentle laxatives to have a bowel movement about every 2 weeks.  Tenderness to left lower quadrant today on exam.  Has tried Linzess in the past and that worked well for her.  Will give additional samples today and we will keep her on this if  covered by insurance.  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  More recently has been suffering from more frequent nausea however she suspects this is secondary to some of her psychiatric medications which have recently been changed.  Does use Tums as needed for reflux symptoms, likely from dietary indiscretions.  No overt dysphagia or vomiting.  Given her history we will proceed with an upper endoscopy for further evaluation of cause of nausea.  PLAN   Proceed with upper endoscopy and colonoscopy with propofol by Dr. Mordechai April in near future: the risks, benefits, and alternatives have been discussed with the patient in detail. The patient states understanding and desires to proceed. ASA 2 Suflave prep (extended prep given) Zofran  TID as needed for prep Linzess 145 mcg daily for 7 days prior to colonoscopy Start Linzess 145 mcg once daily GERD diet Continue sitz bath May use over-the-counter hemorrhoid cream if needed. Continue Tums as needed Potential banding candidate - provided pamphlet Follow up in 4 months.    Julian Obey, MSN, FNP-BC, AGACNP-BC [facility]

## 2023-06-16 ENCOUNTER — Encounter: Payer: Self-pay | Admitting: Gastroenterology

## 2023-06-16 ENCOUNTER — Telehealth: Payer: Self-pay

## 2023-06-16 ENCOUNTER — Encounter: Payer: Self-pay | Admitting: *Deleted

## 2023-06-16 ENCOUNTER — Ambulatory Visit (INDEPENDENT_AMBULATORY_CARE_PROVIDER_SITE_OTHER): Admitting: Gastroenterology

## 2023-06-16 ENCOUNTER — Other Ambulatory Visit: Payer: Self-pay | Admitting: *Deleted

## 2023-06-16 ENCOUNTER — Other Ambulatory Visit: Payer: Self-pay

## 2023-06-16 VITALS — BP 115/68 | HR 69 | Temp 98.3°F | Ht 67.0 in | Wt 163.6 lb

## 2023-06-16 DIAGNOSIS — K219 Gastro-esophageal reflux disease without esophagitis: Secondary | ICD-10-CM

## 2023-06-16 DIAGNOSIS — R110 Nausea: Secondary | ICD-10-CM

## 2023-06-16 DIAGNOSIS — Z9884 Bariatric surgery status: Secondary | ICD-10-CM

## 2023-06-16 DIAGNOSIS — K625 Hemorrhage of anus and rectum: Secondary | ICD-10-CM | POA: Diagnosis not present

## 2023-06-16 DIAGNOSIS — R140 Abdominal distension (gaseous): Secondary | ICD-10-CM | POA: Diagnosis not present

## 2023-06-16 DIAGNOSIS — M797 Fibromyalgia: Secondary | ICD-10-CM

## 2023-06-16 DIAGNOSIS — K641 Second degree hemorrhoids: Secondary | ICD-10-CM | POA: Diagnosis not present

## 2023-06-16 DIAGNOSIS — R10814 Left lower quadrant abdominal tenderness: Secondary | ICD-10-CM

## 2023-06-16 DIAGNOSIS — K5909 Other constipation: Secondary | ICD-10-CM | POA: Diagnosis not present

## 2023-06-16 DIAGNOSIS — F200 Paranoid schizophrenia: Secondary | ICD-10-CM

## 2023-06-16 MED ORDER — SUFLAVE 178.7 G PO SOLR: 1.0000 | Freq: Once | ORAL | 0 refills | Status: AC

## 2023-06-16 MED ORDER — LINACLOTIDE 145 MCG PO CAPS: 145.0000 ug | ORAL_CAPSULE | Freq: Every day | ORAL | 3 refills | Status: DC

## 2023-06-16 MED ORDER — ONDANSETRON 8 MG PO TBDP: 8.0000 mg | ORAL_TABLET | Freq: Three times a day (TID) | ORAL | 2 refills | Status: DC | PRN

## 2023-06-16 NOTE — Telephone Encounter (Signed)
 Copied from CRM [id number]. Topic: Clinical - Medication Question >> Jun 14, 2023  9:10 AM Hobson Luna F wrote: Reason for CRM: Heidi Llamas with [facility] is calling in because she needs to verify which dose patient is supposed to be on. Heidi Llamas says there are multiple dosages showing and she wants to know which is correct.

## 2023-06-16 NOTE — Addendum Note (Signed)
 Addended by: Eustacio Highman on: 06/16/2023 02:41 PM   Modules accepted: Orders

## 2023-06-16 NOTE — Patient Instructions (Addendum)
 We are can you scheduled for colonoscopy and upper endoscopy in the near future with Dr. Mordechai April.  We will provide you separate detailed written instructions for your prep.  If any point during your prep you are feeling full or nauseous you may take Zofran  up to every 8 hours as needed, you may take a short break and then resume rest of prep if you have difficulty however we are going to try to give you a more palatable prep.  Given your kidney function and somewhat decreased recently I would not feel good about a small volume prep given it is much more concentrated and could cause electrolyte abnormalities.  I am starting you on Linzess 145 mcg once daily.  Need to take this 30 minutes prior to breakfast.  How to take Linzess: Once a day every day on empty stomach, at least 30 minutes before your first meal of the day. It is best to keep medications at a stable temperature Medication is best kept in its original bottle with the disket present.  It is a medication that is meant for everyday use and not to be used as needed.   What to expect: Constipation relief is typically felt in about 1 week Relief of abdominal pain, discomfort, and bloating begins in about 1 week with symptoms typically improving over 12 weeks and beyond. Diarrhea is most common side effect and typically begins within the first 2 weeks and can take 3-4 weeks to resolve It would be helpful to begin treatment over the weekend or when you can be closer to a bathroom   You can go to [email or web address] for patient support and sign up for daily medication reminders.   Continue sitz bath's as needed for hemorrhoids and you may use over-the-counter hemorrhoid cream if needed if he began having bleeding again.  You may see a brief increase of bleeding once you start having bowel movements daily however the smoother bowel movements and the less you have to strain your hemorrhoids should improve.  Follow a GERD diet:  Avoid fried,  fatty, greasy, spicy, citrus foods. Avoid caffeine and carbonated beverages. Avoid chocolate. Try eating 4-6 small meals a day rather than 3 large meals. Do not eat within 3 hours of laying down. Prop head of bed up on wood or bricks to create a 6 inch incline.  You may continue to use Tums as needed for any indigestion/heartburn.

## 2023-06-17 ENCOUNTER — Other Ambulatory Visit: Payer: Self-pay

## 2023-06-17 DIAGNOSIS — M797 Fibromyalgia: Secondary | ICD-10-CM

## 2023-06-21 NOTE — Telephone Encounter (Signed)
 Vicki Thomas from [place] stated someone called her back last week regarding the Risperdone and verified the information, but was unable to rememeber who.

## 2023-06-22 ENCOUNTER — Other Ambulatory Visit: Payer: Self-pay

## 2023-06-22 DIAGNOSIS — M797 Fibromyalgia: Secondary | ICD-10-CM

## 2023-06-27 NOTE — Therapy (Unsigned)
 OUTPATIENT PHYSICAL THERAPY THORACOLUMBAR EVALUATION   Patient Name: CARYSSA ELZEY MRN: [id number] DOB:01-Sep-1960, 63 y.o., female Today's Date: 06/29/2023  END OF SESSION:  PT End of Session - 06/29/23 0923     Visit Number 1    Number of Visits 10    Date for PT Re-Evaluation 07/27/23    Authorization Type [employer] PPO    Authorization Time Period Auth requested    Progress Note Due on Visit 10    PT Start Time 0928    PT Stop Time 1013    PT Time Calculation (min) 45 min    Activity Tolerance Patient tolerated treatment well    Behavior During Therapy WFL for tasks assessed/performed             Past Medical History:  Diagnosis Date   Anxiety    Arthritis    Bipolar 1 disorder (HCC)    Chronic back pain    Chronic fatigue    Chronic headaches    Chronic pain    Fibromyalgia    Hiatal hernia    History of borderline personality disorder    Interstitial cystitis    Lymphedema    Panic disorder with agoraphobia    PTSD (post-traumatic stress disorder)    Past Surgical History:  Procedure Laterality Date   ABDOMINAL HYSTERECTOMY     APPENDECTOMY     BARIATRIC SURGERY     CHOLECYSTECTOMY     ESOPHAGUS SURGERY     FOOT SURGERY     HEMORRHOID SURGERY     HERNIA REPAIR     TONSILLECTOMY     Patient Active Problem List   Diagnosis Date Noted   Bright red rectal bleeding 06/13/2023   Psychophysiological insomnia 06/13/2023   Schizophrenia (HCC) 05/05/2023   Chronic bilateral low back pain with right-sided sciatica 12/10/2015   Sleep disturbance 12/10/2015   Neuropathic pain 12/10/2015   Obesity 12/10/2015   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   Fibromyalgia 12/10/2015   Bipolar I disorder (HCC) 01/13/2014   PTSD (post-traumatic stress disorder) 09/05/2013    PCP: Alison Irvine, FNP  REFERRING PROVIDER: Alison Irvine, FNP  REFERRING DIAG: M79.2 (ICD-10-CM) - Neuropathic pain M79.7 (ICD-10-CM) - Fibromyalgia G89.29,M54.41 (ICD-10-CM) - Chronic  bilateral low back pain with right-sided sciatica  Rationale for Evaluation and Treatment: Rehabilitation  THERAPY DIAG:  Impaired functional mobility, balance, gait, and endurance  Muscle weakness (generalized)  Neuropathic pain  Other low back pain  ONSET DATE: 8 years   SUBJECTIVE:  SUBJECTIVE STATEMENT: Patient reports she was home bound for about 8 years due to psychosis until this year. Reports she has gotten very weak. This year she's gotten out of the house to take care of her health. She would like to get strength back. Reports she gets wiped out very easy. Walks every day to the mail box every day, 3-5 times a day, been doing that for about a month. Has neuropathy in feet, hands, and feels some nerve-like pain in her back as well.   PERTINENT HISTORY:  Hernia repair Removal of gall bladder Fibromyalgia Anxiety psychosis/anthropophobia  PAIN:  Are you having pain? Yes: NPRS scale: 8/10 Pain location: Low back, R shoulder Pain description: Nerve like, stinging, burning Aggravating factors: Just fluctuates with activities Relieving factors: Bengay, Ice  PRECAUTIONS: None  RED FLAGS: None , Endoscopy in two weeks for bowel issues, MD aware  WEIGHT BEARING RESTRICTIONS: No  FALLS:  Has patient fallen in last 6 months? Yes. Number of falls Quite a few. Fallen in the shower, kitchen, hallway. Due to dizziness and balance.   LIVING ENVIRONMENT: Lives with: lives alone Stairs: No Has following equipment at home: Rollator, has shower chair but doesn't use (sponge baths)   OCCUPATION: N/A  PLOF: Independent  PATIENT GOALS: "Just to be able to get strength back and help me with my agility so I'm not so rigid, build up core muscles"  NEXT MD VISIT: June 30th  OBJECTIVE:  Note:  Objective measures were completed at Evaluation unless otherwise noted.  DIAGNOSTIC FINDINGS:  N/A  PATIENT SURVEYS:  Modified Oswestry Low Back Pain Disability Questionnaire: 34 / 50 = 68.0 % Total ABC score: 960 / 1600 = 60.0 %  COGNITION: Overall cognitive status: Within functional limits for tasks assessed     SENSATION: Light touch: Impaired  Dec sensation in R L2 dermatome    POSTURE:  PALPATION:   LUMBAR ROM:   AROM Eval  Flexion WFL  Extension Unable to perform, too painful  Right lateral flexion   Left lateral flexion   Right rotation WFL  Left rotation WFL   (Blank rows = not tested)  LOWER EXTREMITY ROM:     Active  Right eval Left eval  Hip flexion    Hip extension    Hip abduction    Hip adduction    Hip internal rotation    Hip external rotation    Knee flexion    Knee extension    Ankle dorsiflexion    Ankle plantarflexion    Ankle inversion    Ankle eversion     (Blank rows = not tested)  LOWER EXTREMITY MMT:    MMT Right eval Left eval  Hip flexion 3+ 3+  Hip extension    Hip abduction    Hip adduction    Hip internal rotation    Hip external rotation    Knee flexion    Knee extension    Ankle dorsiflexion 4- 4-  Ankle plantarflexion    Ankle inversion    Ankle eversion     (Blank rows = not tested)  LUMBAR SPECIAL TESTS:    FUNCTIONAL TESTS:  30 seconds chair stand test Timed up and go (TUG): 18 seconds, reports transient dizziness   30 sec chair stand: 5 STS, reports dizziness, uses Rollator for support GAIT: Distance walked: 50 Assistive device utilized: Rollator Level of assistance: Modified independence Comments: Forward flexion, dec step length bilat, dec hip ext bilat, dec velocity  TREATMENT DATE:  06/29/23: PT Eval and HEP                                                                                                                   PATIENT EDUCATION:  Education details: PT evaluation, objective  findings, POC, Importance of HEP, Precautions, Clinic policies  Person educated: Patient Education method: Explanation and Demonstration Education comprehension: verbalized understanding and returned demonstration  HOME EXERCISE PROGRAM: Access Code: [email or web address] URL: [email or web address] Date: 06/29/2023 Prepared by: Virgia Griffins Powell-Butler  Exercises - Sit to Stand  - 2 x daily - 7 x weekly - 2 sets - 10 reps - Seated Long Arc Quad  - 2 x daily - 7 x weekly - 2 sets - 10 reps - Heel Raises with Counter Support  - 2 x daily - 7 x weekly - 2 sets - 10 reps - Standing Hip Abduction with Counter Support  - 2 x daily - 7 x weekly - 2 sets - 10 reps - Standing Hip Extension with Counter Support  - 2 x daily - 7 x weekly - 2 sets - 10 reps  ASSESSMENT:  CLINICAL IMPRESSION: Patient is a 63 y.o. female who was seen today for physical therapy evaluation and treatment for M79.2 (ICD-10-CM) - Neuropathic pain M79.7 (ICD-10-CM) - Fibromyalgia G89.29,M54.41 (ICD-10-CM) - Chronic bilateral low back pain with right-sided sciatica. Patient arrives to PT evaluation and demonstrates impaired sensation, limited lumbar ROM, and decreased LE strength/general weakness, that all may be contributing to patient's increased low back pain, impaired gait, and decreased endurance. Patient with relevant history of recurrent falls, dizziness, and being home bound for extended period of time due to psychosis/anthropophobia. Patient will benefit from skilled physical therapy in order to address the above to improve function and QOL.    OBJECTIVE IMPAIRMENTS: Abnormal gait, decreased activity tolerance, decreased balance, decreased coordination, decreased endurance, decreased mobility, difficulty walking, decreased ROM, decreased strength, dizziness, and pain.   ACTIVITY LIMITATIONS: carrying, lifting, bending, standing, squatting, sleeping, stairs, transfers, bed mobility, bathing, and dressing  PARTICIPATION  LIMITATIONS: meal prep, cleaning, laundry, driving, community activity, and yard work  PERSONAL FACTORS: Time since onset of injury/illness/exacerbation are also affecting patient's functional outcome.   REHAB POTENTIAL: Good  CLINICAL DECISION MAKING: Stable/uncomplicated  EVALUATION COMPLEXITY: Low   GOALS: Goals reviewed with patient? No  SHORT TERM GOALS: Target date: 07/13/23 Patient will be independent with performance of HEP to demonstrate adequate self management of symptoms.  Baseline:  Goal status: INITIAL  2.   Patient will report at least a 25% improvement with function or pain overall since beginning PT. Baseline:  Goal status: INITIAL   LONG TERM GOALS: Target date: 08/24/23 Patient will improve Oswestry score by   10  points in order to demonstrate improved self-perceived disability and overall function.  Baseline: Goal status: INITIAL Patient will improve ABC Scale score by  10 % in order to demonstrate improved self-perceived balance and overall function.  Baseline: Goal status: INITIAL 3.  Patient will improve  TUG score  to 12 seconds or less with least restrictive AD in order to demonstrate decreased fall risk.  Baseline: Goal status: INITIAL   4.  Patient will improve all LE MMT  test to at least 4-/5  in order to demonstrate improved LE strength and endurance to return to community ambulation. Baseline:  Goal status: INITIAL  5.  Patient will improve  30 second chair stand  score to at least 10 STS with least restrictive AD in order to demonstrate improved LE endurance for prolonged ambulation.  Baseline: Goal status: INITIAL   PLAN:  PT FREQUENCY: 2x/week  PT DURATION: 6 weeks  PLANNED INTERVENTIONS: 97164- PT Re-evaluation, 97110-Therapeutic exercises, 97530- Therapeutic activity, [id number]- Neuromuscular re-education, 97535- Self Care, 47829- Manual therapy, [id number]- Gait training, [phone]- Electrical stimulation (manual), [phone]- Traction (mechanical),  Patient/Family education, Balance training, Stair training, Taping, Dry Needling, Joint mobilization, Spinal mobilization, and Moist heat.  PLAN FOR NEXT SESSION: BERG/tandem/SL balance assess and add to HEP, Ambulation with quad or SPC, LE strengthening, core strengthening, mention pt speaking to referring doctor about sending another referral for vestibular to address dizziness   10:19 AM, 06/29/23 Marysue Sola, PT, DPT [facility] Auth Request  Referring diagnosis code (ICD 10)? M79.2 (ICD-10-CM) - Neuropathic pain M79.7 (ICD-10-CM) - Fibromyalgia G89.29,M54.41 (ICD-10-CM) - Chronic bilateral low back pain with right-sided sciatica Treatment diagnosis codes (ICD 10)? (if different than referring diagnosis) Z74.09, M62.81, M79.2, M54.59 What was this (referring dx) caused by? []  Surgery [x]  Fall [x]  Ongoing issue []  Arthritis []  Other: ____________  Laterality: []  Rt []  Lt [x]  Both  Deficits: [x]  Pain [x]  Stiffness [x]  Weakness []  Edema []  Balance Deficits []  Coordination [x]  Gait Disturbance [x]  ROM []  Other   Functional Tool Score:  Modified Oswestry Low Back Pain Disability Questionnaire: 34 / 50 = 68.0 % Total ABC score: 960 / 1600 = 60.0 % 30 seconds chair stand test: 5 STS Timed up and go (TUG): 18 seconds  CPT codes: See Planned Interventions listed in the Plan section of the Evaluation.

## 2023-06-29 ENCOUNTER — Encounter (HOSPITAL_COMMUNITY): Payer: Self-pay

## 2023-06-29 ENCOUNTER — Ambulatory Visit (HOSPITAL_COMMUNITY)

## 2023-06-29 ENCOUNTER — Other Ambulatory Visit: Payer: Self-pay

## 2023-06-29 DIAGNOSIS — M5441 Lumbago with sciatica, right side: Secondary | ICD-10-CM | POA: Diagnosis not present

## 2023-06-29 DIAGNOSIS — M792 Neuralgia and neuritis, unspecified: Secondary | ICD-10-CM | POA: Diagnosis present

## 2023-06-29 DIAGNOSIS — G8929 Other chronic pain: Secondary | ICD-10-CM | POA: Diagnosis not present

## 2023-06-29 DIAGNOSIS — Z7409 Other reduced mobility: Secondary | ICD-10-CM | POA: Insufficient documentation

## 2023-06-29 DIAGNOSIS — M5459 Other low back pain: Secondary | ICD-10-CM | POA: Diagnosis present

## 2023-06-29 DIAGNOSIS — M797 Fibromyalgia: Secondary | ICD-10-CM | POA: Insufficient documentation

## 2023-06-29 DIAGNOSIS — M6281 Muscle weakness (generalized): Secondary | ICD-10-CM | POA: Diagnosis present

## 2023-06-30 ENCOUNTER — Telehealth: Payer: Self-pay

## 2023-06-30 NOTE — Telephone Encounter (Signed)
 Copied from CRM [id number]. Topic: Referral - Question >> Jun 29, 2023  4:57 PM Phil Braun wrote: Reason for CRM:   Pt is wanting a referral for a smaller walker with 4 wheels. The one she has now is for a larger person. Please advise.

## 2023-06-30 NOTE — Telephone Encounter (Signed)
 Copied from CRM [id number]. Topic: Clinical - Prescription Issue >> Jun 29, 2023  4:59 PM Phil Braun wrote: Reason for CRM:   Ref clonazePAM  (KLONOPIN ) 0.5 MG tablet  Pt is stating this medication is causing dizziness and nausea and is wanting to see if she can take something different. Please advise.

## 2023-07-04 ENCOUNTER — Telehealth: Payer: Self-pay | Admitting: *Deleted

## 2023-07-04 ENCOUNTER — Other Ambulatory Visit: Payer: Self-pay

## 2023-07-04 DIAGNOSIS — M797 Fibromyalgia: Secondary | ICD-10-CM

## 2023-07-04 DIAGNOSIS — G8929 Other chronic pain: Secondary | ICD-10-CM

## 2023-07-04 DIAGNOSIS — M792 Neuralgia and neuritis, unspecified: Secondary | ICD-10-CM

## 2023-07-04 DIAGNOSIS — F200 Paranoid schizophrenia: Secondary | ICD-10-CM

## 2023-07-04 DIAGNOSIS — F4310 Post-traumatic stress disorder, unspecified: Secondary | ICD-10-CM

## 2023-07-04 MED ORDER — BUSPIRONE HCL 7.5 MG PO TABS: 7.5000 mg | ORAL_TABLET | Freq: Three times a day (TID) | ORAL | 2 refills | Status: DC

## 2023-07-04 NOTE — Telephone Encounter (Signed)
 [employer] PA:  Cohere PA:  Approved Authorization #[id number]  Tracking #[id number] Dates of service 07/05/2023 - 10/04/2023

## 2023-07-05 ENCOUNTER — Telehealth: Payer: Self-pay | Admitting: *Deleted

## 2023-07-05 ENCOUNTER — Ambulatory Visit (HOSPITAL_COMMUNITY): Admitting: Anesthesiology

## 2023-07-05 ENCOUNTER — Encounter (HOSPITAL_COMMUNITY)
Admission: RE | Disposition: A | Discharge status: Home / Self Care | Payer: Self-pay | Source: Home / Self Care | Attending: Internal Medicine

## 2023-07-05 ENCOUNTER — Ambulatory Visit (HOSPITAL_COMMUNITY)
Admission: RE | Admit: 2023-07-05 | Discharge: 2023-07-05 | Disposition: A | Discharge status: Home / Self Care | Attending: Internal Medicine | Admitting: Internal Medicine

## 2023-07-05 ENCOUNTER — Encounter (HOSPITAL_COMMUNITY): Payer: Self-pay | Admitting: Internal Medicine

## 2023-07-05 DIAGNOSIS — K625 Hemorrhage of anus and rectum: Secondary | ICD-10-CM | POA: Diagnosis present

## 2023-07-05 DIAGNOSIS — Z800 Family history of malignant neoplasm of digestive organs: Secondary | ICD-10-CM | POA: Insufficient documentation

## 2023-07-05 DIAGNOSIS — Z539 Procedure and treatment not carried out, unspecified reason: Secondary | ICD-10-CM | POA: Diagnosis not present

## 2023-07-05 SURGERY — CANCELLED PROCEDURE
Anesthesia: General

## 2023-07-05 MED ORDER — NA SULFATE-K SULFATE-MG SULF 17.5-3.13-1.6 GM/177ML PO SOLN: 1.0000 | Freq: Once | ORAL | 0 refills | Status: AC

## 2023-07-05 NOTE — Progress Notes (Signed)
 Patient arrived to endoscopy suite - drinking from a cup.  Was asked about NPO status stated she had soup for breakfast.  Patient stated she received no instructions on prepping for colonoscopy - but did take two laxative tablets.  Informed Dr. Mordechai April of situation and he consulted with patient and advised that procedure would not be done today.  Patient's ride was called and she was picked up.  Message was sent to Dr. Cherisse Cornell office regarding cancellation of this case.

## 2023-07-05 NOTE — H&P (Signed)
 Patient presented for EGD and colonoscopy today.  States she did not receive any information about bowel prep.  Not prepped adequately today for procedures.  Also notes eating soup this morning as well as drinking water during the day.  Will need to reschedule her for her procedures.  I have reached out to my office who will work on getting her rescheduled.

## 2023-07-05 NOTE — Anesthesia Preprocedure Evaluation (Signed)
 Anesthesia Evaluation  Patient identified by MRN, date of birth, ID band Patient awake    Reviewed: Allergy & Precautions, H&P , NPO status , Patient's Chart, lab work & pertinent test results, reviewed documented beta blocker date and time   Airway Mallampati: II  TM Distance: >3 FB Neck ROM: full    Dental no notable dental hx. (+) Dental Advisory Given, Teeth Intact   Pulmonary neg pulmonary ROS, former smoker   Pulmonary exam normal breath sounds clear to auscultation       Cardiovascular Exercise Tolerance: Good negative cardio ROS Normal cardiovascular exam Rhythm:regular Rate:Normal     Neuro/Psych  Headaches PSYCHIATRIC DISORDERS Anxiety  Bipolar Disorder    Neuromuscular disease    GI/Hepatic Neg liver ROS, hiatal hernia,,,  Endo/Other  negative endocrine ROS    Renal/GU negative Renal ROS  negative genitourinary   Musculoskeletal  (+) Arthritis , Osteoarthritis,  Fibromyalgia -  Abdominal   Peds  Hematology negative hematology ROS (+)   Anesthesia Other Findings   Reproductive/Obstetrics negative OB ROS                             Anesthesia Physical Anesthesia Plan  ASA: 2  Anesthesia Plan: General   Post-op Pain Management: Minimal or no pain anticipated   Induction:   PONV Risk Score and Plan: Propofol infusion  Airway Management Planned: Natural Airway and Nasal Cannula  Additional Equipment: None  Intra-op Plan:   Post-operative Plan:   Informed Consent: I have reviewed the patients History and Physical, chart, labs and discussed the procedure including the risks, benefits and alternatives for the proposed anesthesia with the patient or authorized representative who has indicated his/her understanding and acceptance.     Dental Advisory Given  Plan Discussed with: CRNA  Anesthesia Plan Comments:         Anesthesia Quick Evaluation

## 2023-07-05 NOTE — Telephone Encounter (Signed)
 Patient procedure today was cancelled. Did not get prep and showed up not prepped for procedure.   Called pt, rescheduled to 7/1 at 10am. Confirmed pharmacy and advised will send another rx in for her. Will also send her instructions to her mychart. She voiced understanding.   Cohere PA:  [employer]here PA:  Approved Authorization #[id number]  Tracking #[id number] Dates of service 07/05/2023 - 10/04/2023

## 2023-07-06 ENCOUNTER — Encounter (HOSPITAL_COMMUNITY)

## 2023-07-13 ENCOUNTER — Encounter (HOSPITAL_COMMUNITY): Payer: Self-pay

## 2023-07-13 ENCOUNTER — Encounter (HOSPITAL_COMMUNITY)

## 2023-07-14 ENCOUNTER — Ambulatory Visit: Payer: Self-pay

## 2023-07-14 NOTE — Telephone Encounter (Signed)
 FYI Only or Action Required?: FYI only for provider.  Patient was last seen in primary care on 06/08/2023 by Alison Irvine, FNP. Called Nurse Triage reporting Anxiety. Symptoms began several days ago. Interventions attempted: Nothing. Symptoms are: gradually worsening.  Triage Disposition: See PCP When Office is Open (Within 3 Days)  Patient/caregiver understands and will follow disposition?: Yes  Copied from CRM #[id number]. Topic: Clinical - Red Word Triage >> Jul 14, 2023  3:28 PM Felizardo Hotter wrote: Red Word that prompted transfer to Nurse Triage: Pt states starting 4 days ago she has been sweating, trembling, heart racing, chills. Reason for Disposition  Panic attacks are increasing in frequency  Answer Assessment - Initial Assessment Questions 1. DESCRIPTION: Please describe your heart rate or heartbeat that you are having (e.g., fast/slow, regular/irregular, skipped or extra beats, palpitations)     Palpitations  2. ONSET: When did it start? (Minutes, hours or days)      Had episode on Monday 3. DURATION: How long does it last (e.g., seconds, minutes, hours)     Past 4 days 4. PATTERN Does it come and go, or has it been constant since it started?  Does it get worse with exertion?   Are you feeling it now?      Comes and goes  6. HEART RATE: Can you tell me your heart rate? How many beats in 15 seconds?  (Note: not all patients can do this)       Feels maybe be having heart aplpitations  7. RECURRENT SYMPTOM: Have you ever had this before? If Yes, ask: When was the last time? and What happened that time?  yes 8. CAUSE: What do you think is causing the palpitations?     Maybe anxiety or sugar drops 9. CARDIAC HISTORY: Do you have any history of heart disease? (e.g., heart attack, angina, bypass surgery, angioplasty, arrhythmia)      no 10. OTHER SYMPTOMS: Do you have any other symptoms? (e.g., dizziness, chest pain, sweating, difficulty breathing)        Weakness, trembling/shakes, no chest pain, chest feels like pulse is changing, feels weird, no pain in arm/back or neck  EMT came to pt home 4 days ago and stated they did not appear to be having a heart attack.  Answer Assessment - Initial Assessment Questions 1. CONCERN: Did anything happen that prompted you to call today?      Pt weakness, heart racing, shakes at times 2. ANXIETY SYMPTOMS: Can you describe how you (your loved one; patient) have been feeling? (e.g., tense, restless, panicky, anxious, keyed up, overwhelmed, sense of impending doom).      Anxious,  3. ONSET: How long have you been feeling this way? (e.g., hours, days, weeks)     4 days, since Monday 4. SEVERITY: How would you rate the level of anxiety? (e.g., 0 - 10; or mild, moderate, severe).     Moderate, affects daily activities  5. FUNCTIONAL IMPAIRMENT: How have these feelings affected your ability to do daily activities? Have you had more difficulty than usual doing your normal daily activities? (e.g., getting better, same, worse; self-care, school, work, interactions)     6. HISTORY: Have you felt this way before? Have you ever been diagnosed with an anxiety problem in the past? (e.g., generalized anxiety disorder, panic attacks, PTSD). If Yes, ask: How was this problem treated? (e.g., medicines, counseling, etc.)     yes 7. RISK OF HARM - SUICIDAL IDEATION: Do you ever have thoughts of hurting  or killing yourself? If Yes, ask:  Do you have these feelings now? Do you have a plan on how you would do this?     no 8. TREATMENT:  What has been done so far to treat this anxiety? (e.g., medicines, relaxation strategies). What has helped? Buspar  10. POTENTIAL TRIGGERS: Do you drink caffeinated beverages (e.g., coffee, colas, teas), and how much daily? Do you drink alcohol or use any drugs? Have you started any new medicines recently?       Maybe anxiety or sugar drops 11. PATIENT SUPPORT:  Who is with you now? Who do you live with? Do you have family or friends who you can talk to?        yes 12. OTHER SYMPTOMS: Do you have any other symptoms? (e.g., feeling depressed, trouble concentrating, trouble sleeping, trouble breathing, palpitations or fast heartbeat, chest pain, sweating, nausea, or diarrhea)        Weakness, trembling/shakes, no chest pain, chest feels like pulse is changing, feels weird, no pain in arm/back or neck  EMT came to pt home 4 days ago on Monday bc pt having pain that went to her arm and back and neck, and EMT stated they did not appear to see any evidence of a heart attack or cardiac event.  There was no availability in PCP main office until mid July, pt was set up with an appmt for UC tomorrow morning.  Protocols used: Heart Rate and Heartbeat Questions-A-AH, Anxiety and Panic Attack-A-AH

## 2023-07-15 ENCOUNTER — Ambulatory Visit
Admission: RE | Admit: 2023-07-15 | Discharge: 2023-07-15 | Disposition: A | Discharge status: Home / Self Care | Payer: Self-pay | Source: Ambulatory Visit | Attending: Nurse Practitioner

## 2023-07-15 ENCOUNTER — Other Ambulatory Visit: Payer: Self-pay

## 2023-07-15 ENCOUNTER — Telehealth: Payer: Self-pay | Admitting: Nurse Practitioner

## 2023-07-15 ENCOUNTER — Ambulatory Visit (HOSPITAL_COMMUNITY)
Admission: RE | Admit: 2023-07-15 | Discharge: 2023-07-15 | Disposition: A | Discharge status: Home / Self Care | Source: Ambulatory Visit | Attending: Nurse Practitioner | Admitting: Nurse Practitioner

## 2023-07-15 ENCOUNTER — Ambulatory Visit (HOSPITAL_COMMUNITY): Payer: Self-pay

## 2023-07-15 VITALS — BP 89/60 | HR 71 | Temp 97.8°F | Resp 18

## 2023-07-15 DIAGNOSIS — M79602 Pain in left arm: Secondary | ICD-10-CM | POA: Insufficient documentation

## 2023-07-15 DIAGNOSIS — R42 Dizziness and giddiness: Secondary | ICD-10-CM | POA: Diagnosis not present

## 2023-07-15 DIAGNOSIS — R079 Chest pain, unspecified: Secondary | ICD-10-CM | POA: Diagnosis present

## 2023-07-15 DIAGNOSIS — R6883 Chills (without fever): Secondary | ICD-10-CM | POA: Insufficient documentation

## 2023-07-15 DIAGNOSIS — F419 Anxiety disorder, unspecified: Secondary | ICD-10-CM | POA: Diagnosis not present

## 2023-07-15 DIAGNOSIS — M542 Cervicalgia: Secondary | ICD-10-CM | POA: Insufficient documentation

## 2023-07-15 DIAGNOSIS — F411 Generalized anxiety disorder: Secondary | ICD-10-CM | POA: Diagnosis not present

## 2023-07-15 DIAGNOSIS — R110 Nausea: Secondary | ICD-10-CM | POA: Diagnosis not present

## 2023-07-15 DIAGNOSIS — R61 Generalized hyperhidrosis: Secondary | ICD-10-CM | POA: Diagnosis not present

## 2023-07-15 DIAGNOSIS — R519 Headache, unspecified: Secondary | ICD-10-CM | POA: Insufficient documentation

## 2023-07-15 LAB — POCT URINALYSIS DIP (MANUAL ENTRY)
Bilirubin, UA: NEGATIVE
Blood, UA: NEGATIVE
Glucose, UA: NEGATIVE mg/dL
Ketones, POC UA: NEGATIVE mg/dL
Leukocytes, UA: NEGATIVE
Nitrite, UA: NEGATIVE
Protein Ur, POC: NEGATIVE mg/dL
Spec Grav, UA: >=1.03 — AB (ref 1.010–1.025)
Urobilinogen, UA: 0.2 U/dL
pH, UA: 6 (ref 5.0–8.0)

## 2023-07-15 LAB — POCT FASTING CBG KUC MANUAL ENTRY: POCT Glucose (KUC): 90 mg/dL (ref 70–99)

## 2023-07-15 MED ORDER — DIMENHYDRINATE 50 MG PO TABS: 50.0000 mg | ORAL_TABLET | Freq: Three times a day (TID) | ORAL | 0 refills | Status: DC | PRN

## 2023-07-15 NOTE — Telephone Encounter (Signed)
 Received patient's chest x-ray results.  Call patient to discuss results, verified patient with 2 patient identifiers.  Patient was advised that chest x-ray was negative for active cardiopulmonary disease.  Reiterated with patient that if symptoms return or worsen, it is recommended that she follow-up in the emergency department for further evaluation.  Patient was also advised she will be contacted if her pending lab results are abnormal, advised patient that if her potassium is low, she may be advised to go to the emergency department depending on the level.  Patient also advised it is recommended that she follow-up with her primary care physician as discussed at her appointment today.  Patient was in agreement with this plan of care and verbalized understanding.  All questions were answered.

## 2023-07-15 NOTE — ED Provider Notes (Signed)
 [facility]    CSN: [id number] Arrival date & time: 07/15/23  1114      History   Chief Complaint Chief Complaint  Patient presents with   Anxiety    Entered by patient    HPI Vicki Thomas is a 63 y.o. female.   The history is provided by the patient.   Patient presents for complaints of chest pain, intermittent dizziness, increased anxiety, nausea, headache, and diaphoresis.  Patient states symptoms started approximately 5 days ago.  States she was watching the news and developed increased anxiety over what she was watching.  Subsequently, she developed chest pain on the left side of her chest along with left arm pain and pain that radiated into the left neck.  Patient states she did contact EMS at that time, they did come out and evaluate her, her EKG was normal, and she was told that this does not appear to be cardiac related.  Patient states she continues to have intermittent chills, sweats, and nausea along with headache.  States she has not had any chest pain since her symptoms initially started.  She also endorses continued anxiety and intermittent dizziness.  Patient reports that she does have underlying history of vertigo.  Per review of her labs, patient with hypokalemia, patient states this is chronic for her.  She states she is currently not taking any potassium supplements.  Patient further denies fever, chills, cough, shortness of breath, difficulty breathing, wheezing, vomiting, or urinary symptoms.  Patient also endorses underlying history of chronic constipation.  She is under the care of psychiatry, states she was started on BuSpar  approximately 2 weeks ago.  Past Medical History:  Diagnosis Date   Anxiety    Arthritis    Bipolar 1 disorder (HCC)    Chronic back pain    Chronic fatigue    Chronic headaches    Chronic pain    Fibromyalgia    Hiatal hernia    History of borderline personality disorder    Interstitial cystitis    Lymphedema     Panic disorder with agoraphobia    PTSD (post-traumatic stress disorder)     Patient Active Problem List   Diagnosis Date Noted   Bright red rectal bleeding 06/13/2023   Psychophysiological insomnia 06/13/2023   Schizophrenia (HCC) 05/05/2023   Chronic bilateral low back pain with right-sided sciatica 12/10/2015   Sleep disturbance 12/10/2015   Neuropathic pain 12/10/2015   Obesity 12/10/2015   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      Home Medications    Prior to Admission medications   Medication Sig Start Date End Date Taking? Authorizing Provider  aspirin-sod bicarb-citric acid (ALKA-SELTZER) 325 MG TBEF tablet Take 325 mg by mouth every 6 (six) hours as needed.    [provider]  bisacodyl  (DULCOLAX) 5 MG EC tablet Take 5 mg by mouth daily as needed for moderate constipation.    [provider]  busPIRone  (BUSPAR ) 7.5 MG tablet Take 1 tablet (  7.5 mg total) by mouth 3 (three) times daily. 07/04/23   Alison Irvine, FNP  clotrimazole  (GYNE-LOTRIMIN ) 1 % vaginal cream Place 1 Applicatorful vaginally at bedtime. 05/17/23   Jadapalle, Sree, MD  ibuprofen  (ADVIL ) 200 MG tablet Take 400 mg by mouth every 6 (six) hours as needed for moderate pain (pain score 4-6).    [provider]  linaclotide  (LINZESS ) 145 MCG CAPS capsule Take 1 capsule (145 mcg total) by mouth daily before breakfast. 06/16/23   Eustacio Highman, NP  ondansetron  (ZOFRAN -ODT) 8 MG disintegrating tablet Take 1 tablet (8 mg total) by mouth every 8 (eight) hours as needed for nausea or vomiting. 06/16/23    Eustacio Highman, NP  polyethylene glycol (MIRALAX / GLYCOLAX) 17 g packet Take 17 g by mouth daily.    [provider]  potassium chloride  (K-DUR) 10 MEQ tablet Take 1 tablet (10 mEq total) by mouth daily. 04/02/15   Horton, Vonzella Guernsey, MD  simethicone  (MYLICON) 125 MG chewable tablet Chew 125 mg by mouth every 6 (six) hours as needed for flatulence.    [provider]  traZODone  (DESYREL ) 50 MG tablet Take 1-2 tablets (50-100 mg total) by mouth at bedtime as needed for sleep. 06/08/23   Alison Irvine, FNP  Zinc Oxide 16 % OINT Apply topically.    [provider]    Family History Family History  Problem Relation Age of Onset   Alcohol abuse Mother    Colon polyps Mother    Bipolar disorder Father    Paranoid behavior Father    Alcohol abuse Father    Bipolar disorder Sister    Anxiety disorder Sister    Alcohol abuse Brother    Drug abuse Brother    Schizophrenia Maternal Grandfather    Anxiety disorder Maternal Aunt    Bipolar disorder Paternal Aunt    Colon polyps Sibling     Social History Social History   Tobacco Use   Smoking status: Former    Current packs/day: 1.00    Average packs/day: 1 pack/day for 6.0 years (6.0 ttl pk-yrs)    Types: Cigarettes   Smokeless tobacco: Never  Vaping Use   Vaping status: Never Used  Substance Use Topics   Alcohol use: No   Drug use: Yes    Types: Marijuana     Allergies   Bee venom, Brexpiprazole , Erythromycin, Penicillins, Fluoxetine hcl, Haloperidol decanoate, Lamotrigine, Lurasidone hcl, Quetiapine fumarate, Abilify [aripiprazole], Aripiprazole, Ciprocin-fluocin-procin [fluocinolone acetonide], Fexofenadine hcl, Gabapentin, Iodine, Lithium , Marcaine [bupivacaine hcl], Paroxetine hcl, Pregabalin, Remeron [mirtazapine], Seroquel [quetiapine fumarate], Tape, Zofran , and Zonisamide   Review of Systems Review of Systems Per HPI  Physical Exam Triage Vital Signs ED Triage Vitals  Encounter Vitals  Group     BP 07/15/23 1126 (!) 89/60     Girls Systolic BP Percentile --      Girls Diastolic BP Percentile --      Boys Systolic BP Percentile --      Boys Diastolic BP Percentile --      Pulse Rate 07/15/23 1126 71     Resp 07/15/23 1126 18     Temp 07/15/23 1126 97.8 F (36.6 C)     Temp src --      SpO2 07/15/23 1126 97 %     Weight --      Height --      Head Circumference --      Peak Flow --      Pain Score 07/15/23 1124  0     Pain Loc --      Pain Education --      Exclude from Growth Chart --    Orthostatic VS for the past 24 hrs:  BP- Lying Pulse- Lying BP- Sitting Pulse- Sitting BP- Standing at 0 minutes Pulse- Standing at 0 minutes  07/15/23 1135 105/69 63 109/79 73 109/76 81    Updated Vital Signs BP (!) 89/60   Pulse 71   Temp 97.8 F (36.6 C)   Resp 18   SpO2 97%   Visual Acuity Right Eye Distance:   Left Eye Distance:   Bilateral Distance:    Right Eye Near:   Left Eye Near:    Bilateral Near:     Physical Exam Vitals and nursing note reviewed.  Constitutional:      General: She is not in acute distress.    Appearance: Normal appearance.  HENT:     Head: Normocephalic.     Mouth/Throat:     Mouth: Mucous membranes are moist.   Eyes:     Extraocular Movements: Extraocular movements intact.     Pupils: Pupils are equal, round, and reactive to light.    Cardiovascular:     Rate and Rhythm: Normal rate and regular rhythm.     Pulses: Normal pulses.     Heart sounds: Normal heart sounds.  Pulmonary:     Effort: Pulmonary effort is normal. No respiratory distress.     Breath sounds: Normal breath sounds. No stridor. No wheezing, rhonchi or rales.  Chest:     Chest wall: No tenderness.  Abdominal:     General: There is no distension.     Palpations: Abdomen is soft.     Tenderness: There is no abdominal tenderness. There is no right CVA tenderness, left CVA tenderness, guarding or rebound.   Musculoskeletal:     Cervical back: Normal  range of motion.  Lymphadenopathy:     Cervical: No cervical adenopathy.   Skin:    General: Skin is warm and dry.   Neurological:     General: No focal deficit present.     Mental Status: She is alert and oriented to person, place, and time.   Psychiatric:        Mood and Affect: Mood normal.        Behavior: Behavior normal.      UC Treatments / Results  Labs (all labs ordered are listed, but only abnormal results are displayed) Labs Reviewed - No data to display  EKG: Normal sinus rhythm, no ectopy, no STEMI.  Compared to EKGs performed on 05/05/2023, 05/04/2023, and 04/02/2015.   Radiology No results found.  Procedures Procedures (including critical care time)  Medications Ordered in UC Medications - No data to display  Initial Impression / Assessment and Plan / UC Course  I have reviewed the triage vital signs and the nursing notes.  Pertinent labs & imaging results that were available during my care of the patient were reviewed by me and considered in my medical decision making (see chart for details).  Patient presents with a 4 to 5-day history of chest pain, chills, headache, dizziness, and increased anxiety.  EKG shows normal sinus rhythm, no ectopy, no STEMI.  Urinalysis does not indicate obvious infection.  CBC and CMP are pending, check for safety and to ensure patient's potassium levels are within normal limits.  CBG performed which was 90.  Chest x-ray is pending.  Supportive care recommendations were provided and  discussed with the patient to include fluids, rest, and a well-balanced diet.  It is difficult to ascertain the cause of the patient's symptoms at this time.  Will treat patient's dizziness with Dramamine 50 mg as there is a cross allergy with quetiapine which is listed on her allergy list.  Patient was advised to follow-up with psychiatry if her pending test results are all normal and she is continuing to experience symptoms to rule out side effects from  her psychotropic medication.  Patient was given strict ER follow-up precautions, along with indications to follow-up with her PCP within the next 3 to 5 days.  Patient was in agreement with this plan of care and verbalized understanding.  All questions were answered.  Patient stable for discharge.  Final Clinical Impressions(s) / UC Diagnoses   Final diagnoses:  None   Discharge Instructions   None    ED Prescriptions   None    PDMP not reviewed this encounter.   Hardy Lia, NP 07/15/23 1234

## 2023-07-15 NOTE — ED Triage Notes (Addendum)
 Pt presents with episodes lasting 2-3 hours of feeling like she is having a heart attack- headache, diaphoresis, chest pain, left arm pain into neck. First happened 5 days ago after watching the news and she was feeling anxious.She called EMS and her EKG was normal so she stayed home and did not get seen. Denies any chest pain at this time reports feeling anxious.  Pt states her nausea and vertigo has been worsening over the last 5 days.

## 2023-07-15 NOTE — Discharge Instructions (Addendum)
 Your EKG and urinalysis were normal.  A complete blood count and metabolic panel results are pending. Your blood glucose was 90 today.  You will need to go to [facility] for a chest x-ray.  You will need to go to the main entrance of the hospital to get to the radiology department.  You will be contacted when the results of the x-ray are received. Take medication as prescribed. Make sure you are eating well-balanced meals. Make sure you are drinking plenty of fluids.  Your urinalysis shows that you are slightly dehydrated. Make sure you are getting plenty of rest. Avoid sudden movements while dizziness persist. Go to the emergency department immediately if you experience worsening chest pain, shortness of breath, difficulty breathing, worsening dizziness, nausea, vomiting, or other concerns. As discussed, if your lab work shows any abnormalities, you may be instructed to go to the emergency department for further evaluation.  I would like for you to make an appointment with your primary care physician to be evaluated within the next 3 to 5 days. Follow-up as needed.

## 2023-07-16 LAB — CBC WITH DIFFERENTIAL/PLATELET
Basophils Absolute: 0.1 10*3/uL (ref 0.0–0.2)
Basos: 1 %
EOS (ABSOLUTE): 0.4 10*3/uL (ref 0.0–0.4)
Eos: 6 %
Hematocrit: 40.5 % (ref 34.0–46.6)
Hemoglobin: 12.7 g/dL (ref 11.1–15.9)
Immature Grans (Abs): 0 10*3/uL (ref 0.0–0.1)
Immature Granulocytes: 0 %
Lymphocytes Absolute: 1.7 10*3/uL (ref 0.7–3.1)
Lymphs: 29 %
MCH: 32.8 pg (ref 26.6–33.0)
MCHC: 31.4 g/dL — ABNORMAL LOW (ref 31.5–35.7)
MCV: 105 fL — ABNORMAL HIGH (ref 79–97)
Monocytes Absolute: 0.5 10*3/uL (ref 0.1–0.9)
Monocytes: 8 %
Neutrophils Absolute: 3.3 10*3/uL (ref 1.4–7.0)
Neutrophils: 56 %
Platelets: 231 10*3/uL (ref 150–450)
RBC: 3.87 x10E6/uL (ref 3.77–5.28)
RDW: 11.7 % (ref 11.7–15.4)
WBC: 5.9 10*3/uL (ref 3.4–10.8)

## 2023-07-16 LAB — COMPREHENSIVE METABOLIC PANEL WITH GFR
ALT: 26 IU/L (ref 0–32)
AST: 30 IU/L (ref 0–40)
Albumin: 3.5 g/dL — ABNORMAL LOW (ref 3.9–4.9)
Alkaline Phosphatase: 85 IU/L (ref 44–121)
BUN/Creatinine Ratio: 18 (ref 12–28)
BUN: 13 mg/dL (ref 8–27)
Bilirubin Total: 0.3 mg/dL (ref 0.0–1.2)
CO2: 22 mmol/L (ref 20–29)
Calcium: 8.6 mg/dL — ABNORMAL LOW (ref 8.7–10.3)
Chloride: 105 mmol/L (ref 96–106)
Creatinine, Ser: 0.72 mg/dL (ref 0.57–1.00)
Globulin, Total: 2.1 g/dL (ref 1.5–4.5)
Glucose: 74 mg/dL (ref 70–99)
Potassium: 4.5 mmol/L (ref 3.5–5.2)
Sodium: 141 mmol/L (ref 134–144)
Total Protein: 5.6 g/dL — ABNORMAL LOW (ref 6.0–8.5)
eGFR: 94 mL/min/{1.73_m2} (ref >59)

## 2023-07-19 ENCOUNTER — Telehealth: Payer: Self-pay

## 2023-07-19 NOTE — Telephone Encounter (Signed)
 Copied from CRM [id number]. Topic: Clinical - Lab/Test Results >> Jul 19, 2023  1:10 PM Carlatta H wrote: Reason for CRM: Patient would like a call back from nurse about lab results

## 2023-07-20 ENCOUNTER — Encounter (HOSPITAL_COMMUNITY)

## 2023-07-21 NOTE — Progress Notes (Signed)
 Pre op phone call - patient states she did not receive instructions for her procedure prep.  Told patient she should be able to access instructions through her mychart account.  I also printed a copy of her prep instructions and took to main entrance desk for her to pick up.

## 2023-07-22 ENCOUNTER — Ambulatory Visit: Payer: Self-pay

## 2023-07-22 NOTE — Telephone Encounter (Signed)
   FYI Only or Action Required?: FYI only for provider.  Patient was last seen in primary care on 06/08/2023 by Bevely Doffing, FNP. Called Nurse Triage reporting No chief complaint on file.. Symptoms began a week ago. Interventions attempted: Other: went to ED earlier this week. Symptoms are: unchanged.  Triage Disposition: Call EMS 911 Now  Patient/caregiver understands and will follow disposition?: Yes   Copied from CRM [id number]. Topic: Clinical - Red Word Triage >> Jul 22, 2023  5:43 PM Winona R wrote:  Hard time breathing, feels drowsy and bp was 102/58 pulse 73 pt went to Urgent care and ER for the same symptoms on 06/20. She was advised that someone will call her back with a hospital follow up on June 24th however havent heard anything back Reason for Disposition  Sounds like a life-threatening emergency to the triager  Answer Assessment - Initial Assessment Questions 1. BLOOD PRESSURE: What is the blood pressure? Did you take at least two measurements 5 minutes apart? At 1707, 102/58, pulse 70 At 1750 104/60, pulse 83 2. ONSET: When did you take your blood pressure?     1707 and then 1750 3. HOW: How did you obtain the blood pressure? (e.g., visiting nurse, automatic home BP monitor)     Automatic cuff 4. HISTORY: Do you have a history of low blood pressure? What is your blood pressure normally?     For one week  6. PULSE RATE: Do you know what your pulse rate is?      83 7. OTHER SYMPTOMS: Have you been sick recently? Have you had a recent injury? Low blood pressure, shortness of breath, sweating, cloudy headed, chest pain  Protocols used: Blood Pressure - Low-A-AH

## 2023-07-22 NOTE — Telephone Encounter (Addendum)
 RN attempted  callback on 6/27 @ 5:45pm, Voicemail left.               Copied from CRM [id number]. Topic: Clinical - Red Word Triage >> Jul 22, 2023  5:36 PM Winona R wrote: Hard time breathing, feels drowsy and bp was 102/58 pulse 73 pt went to Urgent care and ER for the same symptoms on 06/20. She was advised that someone will call her back with a hospital follow up on June 24th however havent heard anything back

## 2023-07-25 NOTE — Telephone Encounter (Signed)
 Schheduled for 7/8 at 3 pm

## 2023-07-26 ENCOUNTER — Ambulatory Visit (HOSPITAL_COMMUNITY): Admission: RE | Admit: 2023-07-26 | Source: Home / Self Care | Admitting: Internal Medicine

## 2023-07-26 ENCOUNTER — Telehealth: Payer: Self-pay

## 2023-07-26 SURGERY — COLONOSCOPY
Anesthesia: Choice

## 2023-07-26 NOTE — Telephone Encounter (Signed)
 Copied from CRM [id number]. Topic: Clinical - Lab/Test Results >> Jul 26, 2023  1:38 PM Charlet HERO wrote: Reason for CRM: Patient is calling to get help with her test results she would like to know, what the results are about. She is worried about this maybe the reason she is having episodes. Patient would like to have a call back asap.

## 2023-07-27 ENCOUNTER — Encounter (HOSPITAL_COMMUNITY)

## 2023-08-02 ENCOUNTER — Telehealth: Payer: Self-pay

## 2023-08-02 ENCOUNTER — Ambulatory Visit: Payer: Self-pay

## 2023-08-02 ENCOUNTER — Ambulatory Visit

## 2023-08-02 ENCOUNTER — Telehealth

## 2023-08-02 DIAGNOSIS — R42 Dizziness and giddiness: Secondary | ICD-10-CM

## 2023-08-02 MED ORDER — MECLIZINE HCL 25 MG PO TABS: 25.0000 mg | ORAL_TABLET | Freq: Three times a day (TID) | ORAL | 2 refills | Status: DC | PRN

## 2023-08-02 NOTE — Telephone Encounter (Signed)
 Called pt to inform her she was not connected to her video visit yet and that laura had recent her a link to connect

## 2023-08-02 NOTE — Telephone Encounter (Signed)
 FYI Only or Action Required?: FYI only for provider.  Patient was last seen in primary care on 06/08/2023 by Bevely Doffing, FNP.  Called Nurse Triage reporting Dizziness.  Symptoms began several weeks ago.  Interventions attempted: Rest, hydration, or home remedies.  Symptoms are: gradually worsening.  Triage Disposition: See PCP When Office is Open (Within 3 Days)  Patient/caregiver understands and will follow disposition?: Patient cancelled in-person appt today due to not feeling well. Says that she feels dizzy, room spinning, has history of vertigo and was previously prescribed Meclizine . Pt also reports diarrhea. Pt okay with virtual visit.      Copied from CRM [id number]. Topic: Clinical - Red Word Triage >> Aug 02, 2023  1:16 PM Carla L wrote: Red Word that prompted transfer to Nurse Triage: Unable to make it in - having nausea, dizziness (feels like the room is moving) Reason for Disposition  [1] MODERATE dizziness (e.g., vertigo; feels very unsteady, interferes with normal activities) AND [2] has been evaluated by doctor (or NP/PA) for this  Answer Assessment - Initial Assessment Questions 1. DESCRIPTION: Describe your dizziness.     It feels like vertigo, I have these episodes  2. VERTIGO: Do you feel like either you or the room is spinning or tilting?      Feels like the room is moving  3. LIGHTHEADED: Do you feel lightheaded? (e.g., somewhat faint, woozy, weak upon standing)     No  4. SEVERITY: How bad is it?  Can you walk?   - MILD: Feels slightly dizzy and unsteady, but is walking normally.   - MODERATE: Feels unsteady when walking, but not falling; interferes with normal activities (e.g., school, work).   - SEVERE: Unable to walk without falling, or requires assistance to walk without falling.     Mild to moderate. I am staggering. I use my rollator, so when I feel like I am about to fall I sit  5. ONSET:  When did the dizziness begin?     6-8  months.   6. AGGRAVATING FACTORS: Does anything make it worse? (e.g., standing, change in head position)     I cannot tell, there is so much going on, I can't discern what is causes this  7. CAUSE: What do you think is causing the dizziness?     Unsure of cause  8. RECURRENT SYMPTOM: Have you had dizziness before? If Yes, ask: When was the last time? What happened that time?     Yes, has been having this off/on for several months  9. OTHER SYMPTOMS: Do you have any other symptoms? (e.g., headache, weakness, numbness, vomiting, earache)     Diarrhea  10. PREGNANCY: Is there any chance you are pregnant? When was your last menstrual period?       no  Protocols used: Dizziness - Vertigo-A-AH

## 2023-08-02 NOTE — Progress Notes (Signed)
   Virtual Visit via Video Note  I connected with Vicki Thomas on 08/02/23 at  3:00 PM EDT by a video enabled telemedicine application and verified that I am speaking with the correct person using two identifiers.  Patient Location: Home Provider Location: Office/Clinic  I discussed the limitations, risks, security, and privacy concerns of performing an evaluation and management service by video and the availability of in person appointments. I also discussed with the patient that there may be a patient responsible charge related to this service. The patient expressed understanding and agreed to proceed.  Subjective: PCP: Bevely Doffing, FNP  Chief Complaint  Patient presents with   Medical Management of Chronic Issues    Reason for pts video vist is, Dizziness, Nausea and diarrhea    HPI   ROS: Per HPI  Current Outpatient Medications:    aspirin-sod bicarb-citric acid (ALKA-SELTZER) 325 MG TBEF tablet, Take 325 mg by mouth every 6 (six) hours as needed., Disp: , Rfl:    bisacodyl  (DULCOLAX) 5 MG EC tablet, Take 5 mg by mouth daily as needed for moderate constipation., Disp: , Rfl:    busPIRone  (BUSPAR ) 7.5 MG tablet, Take 1 tablet (7.5 mg total) by mouth 3 (three) times daily., Disp: 90 tablet, Rfl: 2   clotrimazole  (GYNE-LOTRIMIN ) 1 % vaginal cream, Place 1 Applicatorful vaginally at bedtime., Disp: 45 g, Rfl: 0   dimenhyDRINATE  (DRAMAMINE) 50 MG tablet, Take 1 tablet (50 mg total) by mouth every 8 (eight) hours as needed for dizziness., Disp: 30 tablet, Rfl: 0   ibuprofen  (ADVIL ) 200 MG tablet, Take 400 mg by mouth every 6 (six) hours as needed for moderate pain (pain score 4-6)., Disp: , Rfl:    linaclotide  (LINZESS ) 145 MCG CAPS capsule, Take 1 capsule (145 mcg total) by mouth daily before breakfast., Disp: 30 capsule, Rfl: 3   ondansetron  (ZOFRAN -ODT) 8 MG disintegrating tablet, Take 1 tablet (8 mg total) by mouth every 8 (eight) hours as needed for nausea or vomiting., Disp:  20 tablet, Rfl: 2   polyethylene glycol (MIRALAX / GLYCOLAX) 17 g packet, Take 17 g by mouth daily., Disp: , Rfl:    potassium chloride  (K-DUR) 10 MEQ tablet, Take 1 tablet (10 mEq total) by mouth daily., Disp: 10 tablet, Rfl: 0   simethicone  (MYLICON) 125 MG chewable tablet, Chew 125 mg by mouth every 6 (six) hours as needed for flatulence., Disp: , Rfl:    traZODone  (DESYREL ) 50 MG tablet, Take 1-2 tablets (50-100 mg total) by mouth at bedtime as needed for sleep., Disp: 60 tablet, Rfl: 2   Zinc Oxide 16 % OINT, Apply topically., Disp: , Rfl:   Observations/Objective: There were no vitals filed for this visit. Physical Exam  Assessment and Plan: There are no diagnoses linked to this encounter.  Follow Up Instructions: No follow-ups on file.   I discussed the assessment and treatment plan with the patient. The patient was provided an opportunity to ask questions, and all were answered. The patient agreed with the plan and demonstrated an understanding of the instructions.   The patient was advised to call back or seek an in-person evaluation if the symptoms worsen or if the condition fails to improve as anticipated.  The above assessment and management plan was discussed with the patient. The patient verbalized understanding of and has agreed to the management plan.   Doffing Bevely, FNP

## 2023-08-05 ENCOUNTER — Ambulatory Visit: Payer: Self-pay

## 2023-08-05 ENCOUNTER — Emergency Department (HOSPITAL_COMMUNITY)

## 2023-08-05 ENCOUNTER — Encounter (HOSPITAL_COMMUNITY): Payer: Self-pay

## 2023-08-05 ENCOUNTER — Other Ambulatory Visit: Payer: Self-pay

## 2023-08-05 ENCOUNTER — Inpatient Hospital Stay (HOSPITAL_COMMUNITY)
Admission: EM | Admit: 2023-08-05 | Discharge: 2023-08-12 | DRG: 443 | Disposition: A | Discharge status: Home / Self Care | Attending: Family Medicine | Admitting: Family Medicine

## 2023-08-05 DIAGNOSIS — K5909 Other constipation: Secondary | ICD-10-CM | POA: Diagnosis present

## 2023-08-05 DIAGNOSIS — E162 Hypoglycemia, unspecified: Secondary | ICD-10-CM | POA: Diagnosis present

## 2023-08-05 DIAGNOSIS — K449 Diaphragmatic hernia without obstruction or gangrene: Secondary | ICD-10-CM | POA: Diagnosis present

## 2023-08-05 DIAGNOSIS — F209 Schizophrenia, unspecified: Secondary | ICD-10-CM | POA: Diagnosis present

## 2023-08-05 DIAGNOSIS — E663 Overweight: Secondary | ICD-10-CM | POA: Diagnosis present

## 2023-08-05 DIAGNOSIS — M797 Fibromyalgia: Secondary | ICD-10-CM | POA: Diagnosis present

## 2023-08-05 DIAGNOSIS — K5730 Diverticulosis of large intestine without perforation or abscess without bleeding: Secondary | ICD-10-CM | POA: Diagnosis present

## 2023-08-05 DIAGNOSIS — Z6827 Body mass index (BMI) 27.0-27.9, adult: Secondary | ICD-10-CM

## 2023-08-05 DIAGNOSIS — R5382 Chronic fatigue, unspecified: Secondary | ICD-10-CM | POA: Diagnosis present

## 2023-08-05 DIAGNOSIS — D128 Benign neoplasm of rectum: Secondary | ICD-10-CM | POA: Diagnosis present

## 2023-08-05 DIAGNOSIS — I959 Hypotension, unspecified: Secondary | ICD-10-CM | POA: Diagnosis not present

## 2023-08-05 DIAGNOSIS — Z818 Family history of other mental and behavioral disorders: Secondary | ICD-10-CM

## 2023-08-05 DIAGNOSIS — Z9049 Acquired absence of other specified parts of digestive tract: Secondary | ICD-10-CM

## 2023-08-05 DIAGNOSIS — F200 Paranoid schizophrenia: Secondary | ICD-10-CM

## 2023-08-05 DIAGNOSIS — Z885 Allergy status to narcotic agent status: Secondary | ICD-10-CM

## 2023-08-05 DIAGNOSIS — M1990 Unspecified osteoarthritis, unspecified site: Secondary | ICD-10-CM | POA: Diagnosis present

## 2023-08-05 DIAGNOSIS — K7200 Acute and subacute hepatic failure without coma: Secondary | ICD-10-CM | POA: Diagnosis not present

## 2023-08-05 DIAGNOSIS — K287 Chronic gastrojejunal ulcer without hemorrhage or perforation: Secondary | ICD-10-CM | POA: Diagnosis present

## 2023-08-05 DIAGNOSIS — F4310 Post-traumatic stress disorder, unspecified: Secondary | ICD-10-CM | POA: Diagnosis present

## 2023-08-05 DIAGNOSIS — Z79899 Other long term (current) drug therapy: Secondary | ICD-10-CM

## 2023-08-05 DIAGNOSIS — Z881 Allergy status to other antibiotic agents status: Secondary | ICD-10-CM

## 2023-08-05 DIAGNOSIS — Z811 Family history of alcohol abuse and dependence: Secondary | ICD-10-CM

## 2023-08-05 DIAGNOSIS — D123 Benign neoplasm of transverse colon: Secondary | ICD-10-CM | POA: Diagnosis present

## 2023-08-05 DIAGNOSIS — F5104 Psychophysiologic insomnia: Secondary | ICD-10-CM

## 2023-08-05 DIAGNOSIS — K648 Other hemorrhoids: Secondary | ICD-10-CM | POA: Diagnosis present

## 2023-08-05 DIAGNOSIS — F17210 Nicotine dependence, cigarettes, uncomplicated: Secondary | ICD-10-CM | POA: Diagnosis present

## 2023-08-05 DIAGNOSIS — Z888 Allergy status to other drugs, medicaments and biological substances status: Secondary | ICD-10-CM

## 2023-08-05 DIAGNOSIS — Z90710 Acquired absence of both cervix and uterus: Secondary | ICD-10-CM

## 2023-08-05 DIAGNOSIS — I493 Ventricular premature depolarization: Secondary | ICD-10-CM | POA: Diagnosis not present

## 2023-08-05 DIAGNOSIS — K625 Hemorrhage of anus and rectum: Secondary | ICD-10-CM | POA: Diagnosis present

## 2023-08-05 DIAGNOSIS — Z83719 Family history of colon polyps, unspecified: Secondary | ICD-10-CM

## 2023-08-05 DIAGNOSIS — R1013 Epigastric pain: Principal | ICD-10-CM

## 2023-08-05 DIAGNOSIS — N3010 Interstitial cystitis (chronic) without hematuria: Secondary | ICD-10-CM | POA: Diagnosis present

## 2023-08-05 DIAGNOSIS — F4001 Agoraphobia with panic disorder: Secondary | ICD-10-CM | POA: Diagnosis present

## 2023-08-05 DIAGNOSIS — G8929 Other chronic pain: Secondary | ICD-10-CM | POA: Diagnosis present

## 2023-08-05 DIAGNOSIS — Z91030 Bee allergy status: Secondary | ICD-10-CM

## 2023-08-05 DIAGNOSIS — R718 Other abnormality of red blood cells: Secondary | ICD-10-CM | POA: Insufficient documentation

## 2023-08-05 DIAGNOSIS — M549 Dorsalgia, unspecified: Secondary | ICD-10-CM | POA: Diagnosis present

## 2023-08-05 DIAGNOSIS — R109 Unspecified abdominal pain: Secondary | ICD-10-CM | POA: Insufficient documentation

## 2023-08-05 DIAGNOSIS — Z9884 Bariatric surgery status: Secondary | ICD-10-CM

## 2023-08-05 DIAGNOSIS — Z880 Allergy status to penicillin: Secondary | ICD-10-CM

## 2023-08-05 DIAGNOSIS — K644 Residual hemorrhoidal skin tags: Secondary | ICD-10-CM | POA: Diagnosis present

## 2023-08-05 DIAGNOSIS — Z813 Family history of other psychoactive substance abuse and dependence: Secondary | ICD-10-CM

## 2023-08-05 DIAGNOSIS — R110 Nausea: Secondary | ICD-10-CM | POA: Insufficient documentation

## 2023-08-05 DIAGNOSIS — R7401 Elevation of levels of liver transaminase levels: Secondary | ICD-10-CM | POA: Diagnosis present

## 2023-08-05 LAB — COMPREHENSIVE METABOLIC PANEL WITH GFR
ALT: 269 U/L — ABNORMAL HIGH (ref 0–44)
AST: 915 U/L — ABNORMAL HIGH (ref 15–41)
Albumin: 3.2 g/dL — ABNORMAL LOW (ref 3.5–5.0)
Alkaline Phosphatase: 211 U/L — ABNORMAL HIGH (ref 38–126)
Anion gap: 12 (ref 5–15)
BUN: 15 mg/dL (ref 8–23)
CO2: 27 mmol/L (ref 22–32)
Calcium: 9.1 mg/dL (ref 8.9–10.3)
Chloride: 101 mmol/L (ref 98–111)
Creatinine, Ser: 0.61 mg/dL (ref 0.44–1.00)
GFR, Estimated: >60 mL/min (ref >60)
Glucose, Bld: 112 mg/dL — ABNORMAL HIGH (ref 70–99)
Potassium: 4.3 mmol/L (ref 3.5–5.1)
Sodium: 140 mmol/L (ref 135–145)
Total Bilirubin: 0.8 mg/dL (ref 0.0–1.2)
Total Protein: 5.8 g/dL — ABNORMAL LOW (ref 6.5–8.1)

## 2023-08-05 LAB — PROTIME-INR
INR: 1 (ref 0.8–1.2)
Prothrombin Time: 13.2 s (ref 11.4–15.2)

## 2023-08-05 LAB — CBC WITH DIFFERENTIAL/PLATELET
Abs Immature Granulocytes: 0.03 K/uL (ref 0.00–0.07)
Basophils Absolute: 0 K/uL (ref 0.0–0.1)
Basophils Relative: 1 %
Eosinophils Absolute: 0.1 K/uL (ref 0.0–0.5)
Eosinophils Relative: 2 %
HCT: 37.9 % (ref 36.0–46.0)
Hemoglobin: 12.3 g/dL (ref 12.0–15.0)
Immature Granulocytes: 1 %
Lymphocytes Relative: 12 %
Lymphs Abs: 0.8 K/uL (ref 0.7–4.0)
MCH: 32.6 pg (ref 26.0–34.0)
MCHC: 32.5 g/dL (ref 30.0–36.0)
MCV: 100.5 fL — ABNORMAL HIGH (ref 80.0–100.0)
Monocytes Absolute: 0.5 K/uL (ref 0.1–1.0)
Monocytes Relative: 8 %
Neutro Abs: 5.1 K/uL (ref 1.7–7.7)
Neutrophils Relative %: 76 %
Platelets: 223 K/uL (ref 150–400)
RBC: 3.77 MIL/uL — ABNORMAL LOW (ref 3.87–5.11)
RDW: 12.2 % (ref 11.5–15.5)
WBC: 6.5 K/uL (ref 4.0–10.5)
nRBC: 0 % (ref 0.0–0.2)

## 2023-08-05 LAB — URINALYSIS, ROUTINE W REFLEX MICROSCOPIC
Bilirubin Urine: NEGATIVE
Glucose, UA: NEGATIVE mg/dL
Hgb urine dipstick: NEGATIVE
Ketones, ur: NEGATIVE mg/dL
Leukocytes,Ua: NEGATIVE
Nitrite: NEGATIVE
Protein, ur: NEGATIVE mg/dL
Specific Gravity, Urine: 1.009 (ref 1.005–1.030)
pH: 7 (ref 5.0–8.0)

## 2023-08-05 LAB — TROPONIN I (HIGH SENSITIVITY): Troponin I (High Sensitivity): 4 ng/L (ref <18)

## 2023-08-05 LAB — LIPASE, BLOOD: Lipase: 100 U/L — ABNORMAL HIGH (ref 11–51)

## 2023-08-05 LAB — ACETAMINOPHEN LEVEL: Acetaminophen (Tylenol), Serum: <10 ug/mL — ABNORMAL LOW (ref 10–30)

## 2023-08-05 MED ORDER — PANTOPRAZOLE SODIUM 40 MG IV SOLR
40.0000 mg | Freq: Once | INTRAVENOUS | Status: AC
  Administered 2023-08-05: 40 mg via INTRAVENOUS
  Filled 2023-08-05: qty 10

## 2023-08-05 MED ORDER — GADOBUTROL 1 MMOL/ML IV SOLN
7.5000 mL | Freq: Once | INTRAVENOUS | Status: AC | PRN
  Administered 2023-08-05: 7.5 mL via INTRAVENOUS

## 2023-08-05 MED ORDER — MORPHINE SULFATE (PF) 4 MG/ML IV SOLN
4.0000 mg | Freq: Once | INTRAVENOUS | Status: DC
  Filled 2023-08-05: qty 1

## 2023-08-05 MED ORDER — METOCLOPRAMIDE HCL 5 MG/ML IJ SOLN
10.0000 mg | Freq: Once | INTRAMUSCULAR | Status: DC
  Filled 2023-08-05: qty 2

## 2023-08-05 MED ORDER — METOCLOPRAMIDE HCL 5 MG/ML IJ SOLN
10.0000 mg | Freq: Once | INTRAMUSCULAR | Status: AC
  Administered 2023-08-05: 10 mg via INTRAVENOUS
  Filled 2023-08-05: qty 2

## 2023-08-05 MED ORDER — MORPHINE SULFATE (PF) 4 MG/ML IV SOLN
4.0000 mg | Freq: Once | INTRAVENOUS | Status: AC
  Administered 2023-08-05: 4 mg via INTRAVENOUS
  Filled 2023-08-05: qty 1

## 2023-08-05 MED ORDER — LACTATED RINGERS IV BOLUS
1000.0000 mL | Freq: Once | INTRAVENOUS | Status: AC
  Administered 2023-08-05: 1000 mL via INTRAVENOUS

## 2023-08-05 NOTE — ED Triage Notes (Signed)
 Pt reports having some diarrhea and abd cramping.  EMS gave zofran  4mg  and fentanyl 75mcg and pt feels much better now.

## 2023-08-05 NOTE — ED Provider Notes (Signed)
 [place] EMERGENCY DEPARTMENT AT [facility] Provider Note   CSN: [id number] Arrival date & time: 08/05/23  1355     History {Add pertinent medical, surgical, social history, OB history to HPI:1} Chief Complaint  Patient presents with   Abdominal Cramping    MARISEL TOSTENSON is a 63 y.o. female with PMH as listed below who presents with constant epigastric abdominal pain that started earlier today.  EMS gave zofran  4mg  and fentanyl 75mcg with some improvement in the pain, now 3/10, but the nausea has returned. She endorses diarrhea yesterday with no blood. She states she has had this pain before, normally drinks milk and it goes away, but she tried to drink milk today but it just got worse. She is worried she may have an ulcer. Denies EtOH or tobacco use. Hasn't had a bowel movement today but is passing gas. No h/o cholelithiasis.    Past Medical History:  Diagnosis Date   Anxiety    Arthritis    Bipolar 1 disorder (HCC)    Chronic back pain    Chronic fatigue    Chronic headaches    Chronic pain    Fibromyalgia    Hiatal hernia    History of borderline personality disorder    Interstitial cystitis    Lymphedema    Panic disorder with agoraphobia    PTSD (post-traumatic stress disorder)        Home Medications Prior to Admission medications   Medication Sig Start Date End Date Taking? Authorizing Provider  aspirin-sod bicarb-citric acid (ALKA-SELTZER) 325 MG TBEF tablet Take 325 mg by mouth every 6 (six) hours as needed.    [provider]  benztropine  (COGENTIN ) 0.5 MG tablet Take 0.5 mg by mouth daily. 07/13/23   [provider]  bisacodyl  (DULCOLAX) 5 MG EC tablet Take 5 mg by mouth daily as needed for moderate constipation.    [provider]  busPIRone  (BUSPAR ) 7.5 MG tablet Take 1 tablet (7.5 mg total) by mouth 3 (three) times daily. 07/04/23   Bevely Doffing, FNP  clonazePAM  (KLONOPIN ) 0.5 MG tablet Take 0.5 mg by mouth 2 (two)  times daily. 05/31/23   [provider]  clotrimazole  (GYNE-LOTRIMIN ) 1 % vaginal cream Place 1 Applicatorful vaginally at bedtime. 05/17/23   Jadapalle, Sree, MD  ibuprofen  (ADVIL ) 200 MG tablet Take 400 mg by mouth every 6 (six) hours as needed for moderate pain (pain score 4-6).    [provider]  linaclotide  (LINZESS ) 145 MCG CAPS capsule Take 1 capsule (145 mcg total) by mouth daily before breakfast. 06/16/23   Kennedy Charmaine CROME, NP  meclizine  (ANTIVERT ) 25 MG tablet Take 1 tablet (25 mg total) by mouth 3 (three) times daily as needed for dizziness or nausea. 08/02/23   Bevely Doffing, FNP  ondansetron  (ZOFRAN -ODT) 8 MG disintegrating tablet Take 1 tablet (8 mg total) by mouth every 8 (eight) hours as needed for nausea or vomiting. 06/16/23   Kennedy Charmaine CROME, NP  polyethylene glycol (MIRALAX  / GLYCOLAX ) 17 g packet Take 17 g by mouth daily.    [provider]  potassium chloride  (K-DUR) 10 MEQ tablet Take 1 tablet (10 mEq total) by mouth daily. 04/02/15   Horton, Charmaine FALCON, MD  promethazine  (PHENERGAN ) 12.5 MG tablet Take 12.5 mg by mouth 2 (two) times daily as needed. 06/21/23   [provider]  simethicone  (MYLICON) 125 MG chewable tablet Chew 125 mg by mouth every 6 (six) hours as needed for flatulence.  [provider]  traZODone  (DESYREL ) 50 MG tablet Take 1-2 tablets (50-100 mg total) by mouth at bedtime as needed for sleep. 06/08/23   Bevely Doffing, FNP  UZEDY  50 MG/0.14ML SUSY Inject 50 mg into the skin every 30 (thirty) days. 07/26/23   [provider]  Zinc Oxide 16 % OINT Apply topically.    [provider]      Allergies    Bee venom, Brexpiprazole , Erythromycin, Penicillins, Fluoxetine hcl, Haloperidol decanoate, Lamotrigine, Lurasidone hcl, Quetiapine fumarate, Abilify [aripiprazole], Aripiprazole, Ciprocin-fluocin-procin [fluocinolone acetonide], Fexofenadine hcl, Gabapentin, Iodine, Lithium , Marcaine [bupivacaine hcl],  Paroxetine hcl, Pregabalin, Remeron [mirtazapine], Seroquel [quetiapine fumarate], Tape, Zofran , and Zonisamide    Review of Systems   Review of Systems A 10 point review of systems was performed and is negative unless otherwise reported in HPI.  Physical Exam Updated Vital Signs BP 102/64   Pulse 72   Temp 97.9 F (36.6 C) (Oral)   Resp 16   Ht 5' 7 (1.702 m)   Wt 74.2 kg   SpO2 98%   BMI 25.62 kg/m  Physical Exam General: Normal appearing female, lying in bed.  HEENT: PERRLA, Sclera anicteric, MMM, trachea midline.  Cardiology: RRR, no murmurs/rubs/gallops. BL PT pulses equal bilaterally.  Resp: Normal respiratory rate and effort. CTAB, no wheezes, rhonchi, crackles.  Abd: Soft, significant tenderness in the epigastrium, non-distended. No rebound tenderness or guarding.  GU: Deferred. MSK: No peripheral edema or signs of trauma. Extremities without deformity or TTP. No cyanosis or clubbing. Skin: warm, dry Back: No CVA tenderness Neuro: A&Ox4, CNs II-XII grossly intact. MAEs. Sensation grossly intact.  Psych: Normal mood and affect.   ED Results / Procedures / Treatments   Labs (all labs ordered are listed, but only abnormal results are displayed) Labs Reviewed  CBC WITH DIFFERENTIAL/PLATELET  COMPREHENSIVE METABOLIC PANEL WITH GFR  LIPASE, BLOOD  URINALYSIS, ROUTINE W REFLEX MICROSCOPIC  TROPONIN I (HIGH SENSITIVITY)    EKG None  Radiology No results found.  Procedures Procedures  {Document cardiac monitor, telemetry assessment procedure when appropriate:1}  Medications Ordered in ED Medications  metoCLOPramide  (REGLAN ) injection 10 mg (has no administration in time range)  pantoprazole  (PROTONIX ) injection 40 mg (has no administration in time range)    ED Course/ Medical Decision Making/ A&P                          Medical Decision Making Amount and/or Complexity of Data Reviewed Labs: ordered.  Risk Prescription drug management.    This  patient presents to the ED for concern of abd pain, N/V/D, this involves an extensive number of treatment options, and is a complaint that carries with it a high risk of complications and morbidity.  I considered the following differential and admission for this acute, potentially life threatening condition.   MDM:    With epigastric abdominal pain consider gastritis/GERD/PUD, pancreatitis, acute hepatobiliary disease, gastroenteritis.  EKG without signs of ischemia and troponin negative, low concern for ACS.  Must also consider perforated viscus though she has no signs of peritonitis on exam but is very tender.     Labs: I Ordered, and personally interpreted labs.  The pertinent results include:  those listed above  Imaging Studies ordered: I ordered imaging studies including CT abd pelvis I independently visualized and interpreted imaging. I agree with the radiologist interpretation  Additional history obtained from chart review.   Cardiac Monitoring: The patient was maintained on a cardiac monitor.  I personally viewed and interpreted the cardiac monitored which showed an underlying rhythm of: NSR  Reevaluation: After the interventions noted above, I reevaluated the patient and found that they have :{resolved/improved/worsened:23923::improved}  Social Determinants of Health: Lives independently  Disposition:  ***  Co morbidities that complicate the patient evaluation  Past Medical History:  Diagnosis Date   Anxiety    Arthritis    Bipolar 1 disorder (HCC)    Chronic back pain    Chronic fatigue    Chronic headaches    Chronic pain    Fibromyalgia    Hiatal hernia    History of borderline personality disorder    Interstitial cystitis    Lymphedema    Panic disorder with agoraphobia    PTSD (post-traumatic stress disorder)      Medicines Meds ordered this encounter  Medications   metoCLOPramide  (REGLAN ) injection 10 mg   pantoprazole  (PROTONIX ) injection 40 mg     I have reviewed the patients home medicines and have made adjustments as needed  Problem List / ED Course: Problem List Items Addressed This Visit   None        {Document critical care time when appropriate:1} {Document review of labs and clinical decision tools ie heart score, Chads2Vasc2 etc:1}  {Document your independent review of radiology images, and any outside records:1} {Document your discussion with family members, caretakers, and with consultants:1} {Document social determinants of health affecting pt's care:1} {Document your decision making why or why not admission, treatments were needed:1}  This note was created using dictation software, which may contain spelling or grammatical errors.

## 2023-08-05 NOTE — Telephone Encounter (Signed)
 FYI Only or Action Required?: FYI only for provider.  Patient was last seen in primary care on 08/02/2023 by Bevely Doffing, FNP.  Called Nurse Triage reporting Abdominal Pain.  Symptoms began today.  Interventions attempted: Nothing.  Symptoms are: rapidly worsening.  Triage Disposition: No disposition on file.  Patient/caregiver understands and will follow disposition?:   To ER  Copied from CRM [id number]. Topic: Clinical - Red Word Triage >> Aug 05, 2023 12:51 PM Sophia H wrote: Red Word that prompted transfer to Nurse Triage: Severe stomach pain, clear nasal drip - been going on for last hour. Patient states normally if she has stomach pain she can drink a glass of milk and it will stop but she has been drinking milk with no change.   Reason for Disposition  [1] SEVERE pain (e.g., excruciating) AND [2] present > 1 hour  Answer Assessment - Initial Assessment Questions 1. LOCATION: Where does it hurt?      Abd pain, during start of call patient moaning and crying in pain, called 911. 2. RADIATION: Does the pain shoot anywhere else? (e.g., chest, back)     States feels like she is going to explode under her breast. 3. ONSET: When did the pain begin? (e.g., minutes, hours or days ago)      today 4. SUDDEN: Gradual or sudden onset?     sudden 5. PATTERN Does the pain come and go, or is it constant?     Comes in waves 6. SEVERITY: How bad is the pain?  (e.g., Scale 1-10; mild, moderate, or severe)     severe 7. RECURRENT SYMPTOM: Have you ever had this type of stomach pain before? If Yes, ask: When was the last time? and What happened that time?      no 8. CAUSE: What do you think is causing the stomach pain? (e.g., gallstones, recent abdominal surgery)     Na to ER 9. RELIEVING/AGGRAVATING FACTORS: What makes it better or worse? (e.g., antacids, bending or twisting motion, bowel movement)     Na to ER 10. OTHER SYMPTOMS: Do you have any other symptoms?  (e.g., back pain, diarrhea, fever, urination pain, vomiting)       Na to ER 11. PREGNANCY: Is there any chance you are pregnant? When was your last menstrual period?       na  Protocols used: Abdominal Pain - Female-A-AH

## 2023-08-06 DIAGNOSIS — K287 Chronic gastrojejunal ulcer without hemorrhage or perforation: Secondary | ICD-10-CM | POA: Diagnosis present

## 2023-08-06 DIAGNOSIS — R1013 Epigastric pain: Secondary | ICD-10-CM

## 2023-08-06 DIAGNOSIS — Z79899 Other long term (current) drug therapy: Secondary | ICD-10-CM | POA: Diagnosis not present

## 2023-08-06 DIAGNOSIS — R109 Unspecified abdominal pain: Secondary | ICD-10-CM | POA: Insufficient documentation

## 2023-08-06 DIAGNOSIS — R001 Bradycardia, unspecified: Secondary | ICD-10-CM | POA: Diagnosis not present

## 2023-08-06 DIAGNOSIS — R42 Dizziness and giddiness: Secondary | ICD-10-CM | POA: Diagnosis not present

## 2023-08-06 DIAGNOSIS — M797 Fibromyalgia: Secondary | ICD-10-CM | POA: Diagnosis present

## 2023-08-06 DIAGNOSIS — K648 Other hemorrhoids: Secondary | ICD-10-CM | POA: Diagnosis not present

## 2023-08-06 DIAGNOSIS — R079 Chest pain, unspecified: Secondary | ICD-10-CM | POA: Diagnosis not present

## 2023-08-06 DIAGNOSIS — Z91030 Bee allergy status: Secondary | ICD-10-CM | POA: Diagnosis not present

## 2023-08-06 DIAGNOSIS — K838 Other specified diseases of biliary tract: Secondary | ICD-10-CM | POA: Diagnosis not present

## 2023-08-06 DIAGNOSIS — R110 Nausea: Secondary | ICD-10-CM | POA: Insufficient documentation

## 2023-08-06 DIAGNOSIS — K7200 Acute and subacute hepatic failure without coma: Secondary | ICD-10-CM | POA: Diagnosis present

## 2023-08-06 DIAGNOSIS — F4001 Agoraphobia with panic disorder: Secondary | ICD-10-CM | POA: Diagnosis present

## 2023-08-06 DIAGNOSIS — G8929 Other chronic pain: Secondary | ICD-10-CM | POA: Diagnosis present

## 2023-08-06 DIAGNOSIS — K289 Gastrojejunal ulcer, unspecified as acute or chronic, without hemorrhage or perforation: Secondary | ICD-10-CM | POA: Diagnosis not present

## 2023-08-06 DIAGNOSIS — F209 Schizophrenia, unspecified: Secondary | ICD-10-CM | POA: Diagnosis present

## 2023-08-06 DIAGNOSIS — R7401 Elevation of levels of liver transaminase levels: Secondary | ICD-10-CM | POA: Diagnosis present

## 2023-08-06 DIAGNOSIS — R718 Other abnormality of red blood cells: Secondary | ICD-10-CM | POA: Insufficient documentation

## 2023-08-06 DIAGNOSIS — F17210 Nicotine dependence, cigarettes, uncomplicated: Secondary | ICD-10-CM | POA: Diagnosis present

## 2023-08-06 DIAGNOSIS — M1990 Unspecified osteoarthritis, unspecified site: Secondary | ICD-10-CM | POA: Diagnosis present

## 2023-08-06 DIAGNOSIS — Z885 Allergy status to narcotic agent status: Secondary | ICD-10-CM | POA: Diagnosis not present

## 2023-08-06 DIAGNOSIS — I493 Ventricular premature depolarization: Secondary | ICD-10-CM | POA: Diagnosis not present

## 2023-08-06 DIAGNOSIS — D123 Benign neoplasm of transverse colon: Secondary | ICD-10-CM | POA: Diagnosis present

## 2023-08-06 DIAGNOSIS — Z881 Allergy status to other antibiotic agents status: Secondary | ICD-10-CM | POA: Diagnosis not present

## 2023-08-06 DIAGNOSIS — F4310 Post-traumatic stress disorder, unspecified: Secondary | ICD-10-CM | POA: Diagnosis present

## 2023-08-06 DIAGNOSIS — K5909 Other constipation: Secondary | ICD-10-CM | POA: Diagnosis present

## 2023-08-06 DIAGNOSIS — I959 Hypotension, unspecified: Secondary | ICD-10-CM | POA: Diagnosis not present

## 2023-08-06 DIAGNOSIS — Z818 Family history of other mental and behavioral disorders: Secondary | ICD-10-CM | POA: Diagnosis not present

## 2023-08-06 DIAGNOSIS — Z888 Allergy status to other drugs, medicaments and biological substances status: Secondary | ICD-10-CM | POA: Diagnosis not present

## 2023-08-06 DIAGNOSIS — Z9884 Bariatric surgery status: Secondary | ICD-10-CM | POA: Diagnosis not present

## 2023-08-06 DIAGNOSIS — E663 Overweight: Secondary | ICD-10-CM | POA: Diagnosis present

## 2023-08-06 DIAGNOSIS — K5730 Diverticulosis of large intestine without perforation or abscess without bleeding: Secondary | ICD-10-CM | POA: Diagnosis not present

## 2023-08-06 DIAGNOSIS — Z6827 Body mass index (BMI) 27.0-27.9, adult: Secondary | ICD-10-CM | POA: Diagnosis not present

## 2023-08-06 DIAGNOSIS — M549 Dorsalgia, unspecified: Secondary | ICD-10-CM | POA: Diagnosis present

## 2023-08-06 DIAGNOSIS — R7989 Other specified abnormal findings of blood chemistry: Secondary | ICD-10-CM | POA: Diagnosis not present

## 2023-08-06 DIAGNOSIS — D128 Benign neoplasm of rectum: Secondary | ICD-10-CM | POA: Diagnosis present

## 2023-08-06 DIAGNOSIS — Z880 Allergy status to penicillin: Secondary | ICD-10-CM | POA: Diagnosis not present

## 2023-08-06 DIAGNOSIS — K625 Hemorrhage of anus and rectum: Secondary | ICD-10-CM | POA: Diagnosis not present

## 2023-08-06 DIAGNOSIS — I9589 Other hypotension: Secondary | ICD-10-CM | POA: Diagnosis not present

## 2023-08-06 LAB — IRON AND TIBC
Iron: 294 ug/dL — ABNORMAL HIGH (ref 28–170)
Saturation Ratios: 84 % — ABNORMAL HIGH (ref 10.4–31.8)
TIBC: 352 ug/dL (ref 250–450)
UIBC: 58 ug/dL

## 2023-08-06 LAB — PHOSPHORUS: Phosphorus: 5 mg/dL — ABNORMAL HIGH (ref 2.5–4.6)

## 2023-08-06 LAB — CBC
HCT: 34.6 % — ABNORMAL LOW (ref 36.0–46.0)
Hemoglobin: 11.5 g/dL — ABNORMAL LOW (ref 12.0–15.0)
MCH: 32.9 pg (ref 26.0–34.0)
MCHC: 33.2 g/dL (ref 30.0–36.0)
MCV: 98.9 fL (ref 80.0–100.0)
Platelets: 186 K/uL (ref 150–400)
RBC: 3.5 MIL/uL — ABNORMAL LOW (ref 3.87–5.11)
RDW: 12.1 % (ref 11.5–15.5)
WBC: 4.2 K/uL (ref 4.0–10.5)
nRBC: 0 % (ref 0.0–0.2)

## 2023-08-06 LAB — MAGNESIUM: Magnesium: 1.9 mg/dL (ref 1.7–2.4)

## 2023-08-06 LAB — COMPREHENSIVE METABOLIC PANEL WITH GFR
ALT: 611 U/L — ABNORMAL HIGH (ref 0–44)
AST: 1085 U/L — ABNORMAL HIGH (ref 15–41)
Albumin: 3 g/dL — ABNORMAL LOW (ref 3.5–5.0)
Alkaline Phosphatase: 295 U/L — ABNORMAL HIGH (ref 38–126)
Anion gap: 7 (ref 5–15)
BUN: 11 mg/dL (ref 8–23)
CO2: 25 mmol/L (ref 22–32)
Calcium: 8.6 mg/dL — ABNORMAL LOW (ref 8.9–10.3)
Chloride: 104 mmol/L (ref 98–111)
Creatinine, Ser: 0.51 mg/dL (ref 0.44–1.00)
GFR, Estimated: >60 mL/min (ref >60)
Glucose, Bld: 125 mg/dL — ABNORMAL HIGH (ref 70–99)
Potassium: 3.8 mmol/L (ref 3.5–5.1)
Sodium: 136 mmol/L (ref 135–145)
Total Bilirubin: 0.9 mg/dL (ref 0.0–1.2)
Total Protein: 5.5 g/dL — ABNORMAL LOW (ref 6.5–8.1)

## 2023-08-06 LAB — VITAMIN B12: Vitamin B-12: 186 pg/mL (ref 180–914)

## 2023-08-06 LAB — FOLATE: Folate: 16.8 ng/mL (ref >5.9)

## 2023-08-06 LAB — HEPATITIS PANEL, ACUTE
HCV Ab: NONREACTIVE
Hep A IgM: NONREACTIVE
Hep B C IgM: NONREACTIVE
Hepatitis B Surface Ag: NONREACTIVE

## 2023-08-06 LAB — LACTATE DEHYDROGENASE: LDH: 215 U/L — ABNORMAL HIGH (ref 98–192)

## 2023-08-06 LAB — FERRITIN: Ferritin: 23 ng/mL (ref 11–307)

## 2023-08-06 LAB — HIV ANTIBODY (ROUTINE TESTING W REFLEX): HIV Screen 4th Generation wRfx: NONREACTIVE

## 2023-08-06 MED ORDER — BISACODYL 10 MG RE SUPP: 10.0000 mg | Freq: Every day | RECTAL | Status: DC | PRN

## 2023-08-06 MED ORDER — POLYETHYLENE GLYCOL 3350 17 G PO PACK
17.0000 g | PACK | Freq: Three times a day (TID) | ORAL | Status: DC
  Administered 2023-08-06 – 2023-08-07 (×4): 17 g via ORAL
  Filled 2023-08-06 (×3): qty 1

## 2023-08-06 MED ORDER — KETOROLAC TROMETHAMINE 15 MG/ML IJ SOLN
15.0000 mg | Freq: Four times a day (QID) | INTRAMUSCULAR | Status: DC | PRN
  Administered 2023-08-06 – 2023-08-07 (×2): 15 mg via INTRAVENOUS
  Filled 2023-08-06 (×2): qty 1

## 2023-08-06 MED ORDER — BUSPIRONE HCL 7.5 MG PO TABS
3.7500 mg | ORAL_TABLET | Freq: Three times a day (TID) | ORAL | Status: DC | PRN
  Filled 2023-08-06 (×2): qty 1

## 2023-08-06 MED ORDER — KETOROLAC TROMETHAMINE 15 MG/ML IJ SOLN: 15.0000 mg | Freq: Once | INTRAMUSCULAR | Status: DC

## 2023-08-06 MED ORDER — POLYETHYLENE GLYCOL 3350 17 G PO PACK
17.0000 g | PACK | Freq: Every day | ORAL | Status: DC
  Administered 2023-08-06: 17 g via ORAL
  Filled 2023-08-06: qty 1

## 2023-08-06 MED ORDER — PANTOPRAZOLE SODIUM 40 MG IV SOLR
40.0000 mg | Freq: Two times a day (BID) | INTRAVENOUS | Status: DC
  Administered 2023-08-06 – 2023-08-09 (×7): 40 mg via INTRAVENOUS
  Filled 2023-08-06 (×7): qty 10

## 2023-08-06 MED ORDER — SODIUM CHLORIDE 0.9 % IV SOLN: INTRAVENOUS | Status: AC

## 2023-08-06 MED ORDER — MORPHINE SULFATE (PF) 2 MG/ML IV SOLN: 1.0000 mg | INTRAVENOUS | Status: DC | PRN

## 2023-08-06 MED ORDER — BENZTROPINE MESYLATE 1 MG PO TABS
0.5000 mg | ORAL_TABLET | Freq: Every day | ORAL | Status: DC
  Administered 2023-08-07 – 2023-08-12 (×6): 0.5 mg via ORAL
  Filled 2023-08-06 (×6): qty 1

## 2023-08-06 MED ORDER — TRAZODONE HCL 50 MG PO TABS
50.0000 mg | ORAL_TABLET | Freq: Every evening | ORAL | Status: DC | PRN
  Administered 2023-08-06 – 2023-08-12 (×5): 50 mg via ORAL
  Filled 2023-08-06 (×5): qty 1

## 2023-08-06 MED ORDER — ONDANSETRON HCL 4 MG/2ML IJ SOLN
4.0000 mg | Freq: Four times a day (QID) | INTRAMUSCULAR | Status: DC | PRN
  Administered 2023-08-06: 4 mg via INTRAVENOUS
  Filled 2023-08-06: qty 2

## 2023-08-06 MED ORDER — KETOROLAC TROMETHAMINE 15 MG/ML IJ SOLN
15.0000 mg | Freq: Once | INTRAMUSCULAR | Status: AC
  Administered 2023-08-06: 15 mg via INTRAVENOUS
  Filled 2023-08-06: qty 1

## 2023-08-06 MED ORDER — MORPHINE SULFATE (PF) 2 MG/ML IV SOLN: 2.0000 mg | INTRAVENOUS | Status: DC | PRN

## 2023-08-06 MED ORDER — PROCHLORPERAZINE EDISYLATE 10 MG/2ML IJ SOLN: 10.0000 mg | Freq: Four times a day (QID) | INTRAMUSCULAR | Status: DC | PRN

## 2023-08-06 MED ORDER — ONDANSETRON HCL 4 MG/2ML IJ SOLN
4.0000 mg | Freq: Four times a day (QID) | INTRAMUSCULAR | Status: DC | PRN
  Administered 2023-08-07 – 2023-08-12 (×10): 4 mg via INTRAVENOUS
  Filled 2023-08-06 (×11): qty 2

## 2023-08-06 NOTE — Progress Notes (Addendum)
 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  Patient is admitted with elevated liver enzymes for which GI was consulted  As per chart review no new medications, no alcohol use    Acute viral hepatitis negative Hep A IgM negative Hep B surface antigen negative hep B core antibody negative Hep C antibody negative  Ultrasound with CBD dilation 16 mm Patient had cholecystectomy  MRI MRCP with and without contrast demonstrating intra and extrahepatic biliary duct dilation CBD 14 mm which appears to be unchanged since 2013 and no stones were reported  Mild prominence of pancreatic Patient appears to have chronically elevated alk alkaline phosphatase at least since 2013   #Elevated liver enzymes #Constipation #Pancreatic dilation  Currently has hepatocellular pattern elevation liver enzymes  Fortunately intact synthetic function of the liver with normal T. bili and INR.  No encephalopathy is reported have no concerns for liver failure at this time  Differential for hepatitis here is toxic, viral, ischemic or stone with liver enzyme elevation in 1000's   Recs:  Continue to trend liver enzymes, CMP INR daily Obtain ANA ASMA AMA, iron profile, total IgG and IgM U tox LDH HSV CMV EBV serology Obtain right upper quadrant Dopplers Miralax  BID to TID for constipation Given CBD and mild PD dilation may benefit from EUS as outpatient Please monitor mental status closely for any signs of hepatic encephalopathy  Full consult note to follow

## 2023-08-06 NOTE — Plan of Care (Signed)

## 2023-08-06 NOTE — Progress Notes (Addendum)
 Patient refused morphine  and Compazine , but prefers Toradol  and Zofran .  These were ordered.

## 2023-08-06 NOTE — Plan of Care (Signed)

## 2023-08-06 NOTE — Progress Notes (Addendum)
 Patient admitted overnight, please see H&P from Dr. Manfred.  Here with abdominal pain/nausea and elevated liver enzymes.  Unclear etiology for liver enzymes but hepatitis panel is still pending.  Ultrasound of abdomen with chronic changes as well as MRCP.  She does appear to be constipated so bowel regimen will be ordered.  GI consult appreciated.  Vicki Bowl DO

## 2023-08-06 NOTE — Progress Notes (Signed)
   08/06/23 1608  TOC Brief Assessment  Insurance and Status Reviewed  Patient has primary care physician Yes  Home environment has been reviewed From home  Prior level of function: Assisted  Prior/Current Home Services Current home services (RN c/ [employer] [facility])  Social Drivers of Health Review SDOH reviewed no interventions necessary  Readmission risk has been reviewed Yes  Transition of care needs no transition of care needs at this time   Transition of Care Department [facility]) has reviewed patient and no TOC needs have been identified at this time. We will continue to monitor patient advancement through interdisciplinary progression rounds. If new patient transition needs arise, please place a TOC consult.

## 2023-08-06 NOTE — H&P (Signed)
 History and Physical    Patient: Vicki Thomas FMW:[id number] DOB: 1960/09/08 DOA: 08/05/2023 DOS: the patient was seen and examined on 08/06/2023 PCP: Bevely Doffing, FNP  Patient coming from: Home  Chief Complaint:  Chief Complaint  Patient presents with   Abdominal Cramping   HPI: Vicki Thomas is a 63 y.o. female with medical history significant of anxiety who presents to the emergency department due to epigastric pain which started yesterday.  Pain was intermittent and was of moderate/severe in intensity.  This was associated with nausea and belching without actually vomiting.  EMS was activated on arrival of EMS team, she was given fentanyl 75 mcg and Zofran  4 mg with some improvement the pain.  She states that other times when she had similar pain, the pain usually resolves after drinking some milk, but this time, the pain worsens.  She states that she usually take 1 to 2 tablets of Motrin  once or twice daily for several years.  She denies alcohol, tobacco or any occasional drug use.  Patient endorsed prior history of cholecystectomy and gastric sleeve surgery.  ED Course:  In the emergency department, BP was 102/64 and other vital signs were within normal range. Workup in the ED showed normal CBC except for MCV of 100.5 and normal BMP except for blood glucose of 112 albumin 3.2, AST 915, ALT 269, ALP 211.  Troponin 4, acetaminophen  less than 10, urinalysis was normal. Right upper quadrant ultrasound shows status post cholecystectomy. Common bile duct is dilated at 16 mm which most likely is due to post cholecystectomy status, but correlation with liver function tests is recommended to rule out obstruction. MRCP showed findings consistent with postcholecystectomy reservoir effect. Patient was treated with Toradol , IV LR 1 L was provided.  Protonix  was given.  Gastroenterologist (Dr. Adine) was consulted and recommended MRCP per EDP  Review of Systems: Review of systems as noted in  the HPI. All other systems reviewed and are negative.   Past Medical History:  Diagnosis Date   Anxiety    Arthritis    Bipolar 1 disorder (HCC)    Chronic back pain    Chronic fatigue    Chronic headaches    Chronic pain    Fibromyalgia    Hiatal hernia    History of borderline personality disorder    Interstitial cystitis    Lymphedema    Panic disorder with agoraphobia    PTSD (post-traumatic stress disorder)    Past Surgical History:  Procedure Laterality Date   ABDOMINAL HYSTERECTOMY     APPENDECTOMY     BARIATRIC SURGERY     CHOLECYSTECTOMY     ESOPHAGUS SURGERY     FOOT SURGERY     HEMORRHOID SURGERY     HERNIA REPAIR     TONSILLECTOMY      Social History:  reports that she has quit smoking. Her smoking use included cigarettes. She has a 6 pack-year smoking history. She has never used smokeless tobacco. She reports current drug use. Drug: Marijuana. She reports that she does not drink alcohol.   Allergies  Allergen Reactions   Bee Venom Swelling    Requires Epipen   Brexpiprazole  Other (See Comments)    Severe head pain   Erythromycin Shortness Of Breath    Throat closes, FLUSHING    Penicillins Shortness Of Breath    Throat closes, FLUSHING   Fluoxetine Hcl     Aggression   Haloperidol Decanoate     Facial mask symptoms  Lamotrigine     Vertigo Increased falls   Lurasidone Hcl     Feels like her head is going to explode Dizziness   Quetiapine Fumarate Swelling   Abilify [Aripiprazole]    Aripiprazole    Ciprocin-Fluocin-Procin [Fluocinolone Acetonide]    Fexofenadine Hcl    Gabapentin     Dizziness and excessive falling   Iodine    Lithium     Marcaine [Bupivacaine Hcl]    Paroxetine Hcl    Pregabalin    Remeron [Mirtazapine]    Seroquel [Quetiapine Fumarate]    Tape    Zofran  Nausea Only   Zonisamide     Family History  Problem Relation Age of Onset   Alcohol abuse Mother    Colon polyps Mother    Bipolar disorder Father     Paranoid behavior Father    Alcohol abuse Father    Bipolar disorder Sister    Anxiety disorder Sister    Alcohol abuse Brother    Drug abuse Brother    Schizophrenia Maternal Grandfather    Anxiety disorder Maternal Aunt    Bipolar disorder Paternal Aunt    Colon polyps Sibling      Prior to Admission medications   Medication Sig Start Date End Date Taking? Authorizing Provider  aspirin-sod bicarb-citric acid (ALKA-SELTZER) 325 MG TBEF tablet Take 325 mg by mouth every 6 (six) hours as needed.    [provider]  benztropine  (COGENTIN ) 0.5 MG tablet Take 0.5 mg by mouth daily. 07/13/23   [provider]  bisacodyl  (DULCOLAX) 5 MG EC tablet Take 5 mg by mouth daily as needed for moderate constipation.    [provider]  busPIRone  (BUSPAR ) 7.5 MG tablet Take 1 tablet (7.5 mg total) by mouth 3 (three) times daily. 07/04/23   Bevely Doffing, FNP  clonazePAM  (KLONOPIN ) 0.5 MG tablet Take 0.5 mg by mouth 2 (two) times daily. 05/31/23   [provider]  clotrimazole  (GYNE-LOTRIMIN ) 1 % vaginal cream Place 1 Applicatorful vaginally at bedtime. 05/17/23   Donnelly Mellow, MD  ibuprofen  (ADVIL ) 200 MG tablet Take 400 mg by mouth every 6 (six) hours as needed for moderate pain (pain score 4-6).    [provider]  linaclotide  (LINZESS ) 145 MCG CAPS capsule Take 1 capsule (145 mcg total) by mouth daily before breakfast. 06/16/23   Kennedy Charmaine CROME, NP  meclizine  (ANTIVERT ) 25 MG tablet Take 1 tablet (25 mg total) by mouth 3 (three) times daily as needed for dizziness or nausea. 08/02/23   Bevely Doffing, FNP  ondansetron  (ZOFRAN -ODT) 8 MG disintegrating tablet Take 1 tablet (8 mg total) by mouth every 8 (eight) hours as needed for nausea or vomiting. 06/16/23   Kennedy Charmaine CROME, NP  polyethylene glycol (MIRALAX  / GLYCOLAX ) 17 g packet Take 17 g by mouth daily.    [provider]  potassium chloride  (K-DUR) 10 MEQ tablet Take 1 tablet (10 mEq total) by  mouth daily. 04/02/15   Horton, Charmaine FALCON, MD  promethazine  (PHENERGAN ) 12.5 MG tablet Take 12.5 mg by mouth 2 (two) times daily as needed. 06/21/23   [provider]  simethicone  (MYLICON) 125 MG chewable tablet Chew 125 mg by mouth every 6 (six) hours as needed for flatulence.    [provider]  traZODone  (DESYREL ) 50 MG tablet Take 1-2 tablets (50-100 mg total) by mouth at bedtime as needed for sleep. 06/08/23   Bevely Doffing, FNP  UZEDY  50 MG/0.14ML SUSY Inject 50 mg into the skin every 30 (  thirty) days. 07/26/23   [provider]  Zinc Oxide 16 % OINT Apply topically.    [provider]    Physical Exam: BP (!) 97/53 (BP Location: Left Arm)   Pulse (!) 54   Temp 97.9 F (36.6 C) (Oral)   Resp 16   Ht 5' 7 (1.702 m)   Wt 78.3 kg   SpO2 96%   BMI 27.04 kg/m   General: 63 y.o. year-old female well developed well nourished in no acute distress.  Alert and oriented x3. HEENT: NCAT, EOMI Neck: Supple, trachea medial Cardiovascular: Regular rate and rhythm with no rubs or gallops.  No thyromegaly or JVD noted.  No lower extremity edema. 2/4 pulses in all 4 extremities. Respiratory: Clear to auscultation with no wheezes or rales. Good inspiratory effort. Abdomen: Soft, tender to palpation of epigastrium without guarding.  Nondistended with normal bowel sounds x4 quadrants. Muskuloskeletal: No cyanosis, clubbing or edema noted bilaterally Neuro: CN II-XII intact, strength 5/5 x 4, sensation, reflexes intact Skin: No ulcerative lesions noted or rashes Psychiatry: Judgement and insight appear normal. Mood is appropriate for condition and setting          Labs on Admission:  Basic Metabolic Panel: Recent Labs  Lab 08/05/23 1526 08/06/23 0354  NA 140 136  K 4.3 3.8  CL 101 104  CO2 27 25  GLUCOSE 112* 125*  BUN 15 11  CREATININE 0.61 0.51  CALCIUM 9.1 8.6*   Liver Function Tests: Recent Labs  Lab 08/05/23 1526 08/06/23 0354  AST 915*  1,085*  ALT 269* 611*  ALKPHOS 211* 295*  BILITOT 0.8 0.9  PROT 5.8* 5.5*  ALBUMIN 3.2* 3.0*   Recent Labs  Lab 08/05/23 1526  LIPASE 100*   No results for input(s): AMMONIA  in the last 168 hours. CBC: Recent Labs  Lab 08/05/23 1526  WBC 6.5  NEUTROABS 5.1  HGB 12.3  HCT 37.9  MCV 100.5*  PLT 223   Cardiac Enzymes: No results for input(s): CKTOTAL, CKMB, CKMBINDEX, TROPONINI in the last 168 hours.  BNP (last 3 results) No results for input(s): BNP in the last 8760 hours.  ProBNP (last 3 results) No results for input(s): PROBNP in the last 8760 hours.  CBG: No results for input(s): GLUCAP in the last 168 hours.  Radiological Exams on Admission: MR ABDOMEN MRCP W WO CONTAST Result Date: 08/05/2023 CLINICAL DATA:  Right upper quadrant abdominal pain, common bile duct dilatation identified by ultrasound, status post cholecystectomy EXAM: MRI ABDOMEN WITHOUT AND WITH CONTRAST (INCLUDING MRCP) TECHNIQUE: Multiplanar multisequence MR imaging of the abdomen was performed both before and after the administration of intravenous contrast. Heavily T2-weighted images of the biliary and pancreatic ducts were obtained, and three-dimensional MRCP images were rendered by post processing. CONTRAST:  7.5mL GADAVIST  GADOBUTROL  1 MMOL/ML IV SOLN COMPARISON:  Same-day right upper quadrant ultrasound, CT abdomen pelvis, 12/17/2011 FINDINGS: Lower chest: No acute abnormality. Hepatobiliary: No focal liver abnormality is seen. Status post cholecystectomy. Intra and extrahepatic biliary ductal dilatation, the common bile duct measuring up to 1.4 cm in caliber. This is unchanged when compared to remote prior CT dated 12/17/2011, in the duct generally tapers to the ampulla without calculus or other obstruction (series 30, image 19). Pancreas: Mild prominence of the pancreatic duct along its length to the ampulla, measuring up to 0.4 cm (series 32, image 21). No inflammatory findings.  Spleen: Normal in size without significant abnormality. Adrenals/Urinary Tract: Adrenal glands are unremarkable. Kidneys are normal, without obvious  renal calculi, solid lesion, or hydronephrosis. Stomach/Bowel: Roux gastric bypass. No evidence of bowel wall thickening, distention, or inflammatory changes. Large burden of stool throughout the colon. Vascular/Lymphatic: No significant vascular findings are present. No enlarged abdominal lymph nodes. Other: No abdominal wall hernia or abnormality. No ascites. Musculoskeletal: No acute or significant osseous findings. IMPRESSION: 1. Intra and extrahepatic biliary ductal dilatation, the common bile duct measuring up to 1.4 cm in caliber. This is unchanged when compared to remote prior CT dated 12/17/2011, and the duct generally tapers to the ampulla without calculus or other obstruction. 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 2. Mild prominence of the pancreatic duct along its length to the ampulla, measuring up to 0.4 cm. This is of uncertain significance, without acute inflammatory findings and as above is similar to prior examination dated 2013. 3. Roux gastric bypass. 4. Large burden of stool throughout the colon. Electronically Signed   By: Marolyn JONETTA Jaksch M.D.   On: 08/05/2023 21:34   MR 3D Recon At Scanner Result Date: 08/05/2023 CLINICAL DATA:  Right upper quadrant abdominal pain, common bile duct dilatation identified by ultrasound, status post cholecystectomy EXAM: MRI ABDOMEN WITHOUT AND WITH CONTRAST (INCLUDING MRCP) TECHNIQUE: Multiplanar multisequence MR imaging of the abdomen was performed both before and after the administration of intravenous contrast. Heavily T2-weighted images of the biliary and pancreatic ducts were obtained, and three-dimensional MRCP images were rendered by post processing. CONTRAST:  7.5mL GADAVIST  GADOBUTROL  1 MMOL/ML IV SOLN COMPARISON:  Same-day right upper quadrant ultrasound, CT abdomen pelvis,  12/17/2011 FINDINGS: Lower chest: No acute abnormality. Hepatobiliary: No focal liver abnormality is seen. Status post cholecystectomy. Intra and extrahepatic biliary ductal dilatation, the common bile duct measuring up to 1.4 cm in caliber. This is unchanged when compared to remote prior CT dated 12/17/2011, in the duct generally tapers to the ampulla without calculus or other obstruction (series 30, image 19). Pancreas: Mild prominence of the pancreatic duct along its length to the ampulla, measuring up to 0.4 cm (series 32, image 21). No inflammatory findings. Spleen: Normal in size without significant abnormality. Adrenals/Urinary Tract: Adrenal glands are unremarkable. Kidneys are normal, without obvious renal calculi, solid lesion, or hydronephrosis. Stomach/Bowel: Roux gastric bypass. No evidence of bowel wall thickening, distention, or inflammatory changes. Large burden of stool throughout the colon. Vascular/Lymphatic: No significant vascular findings are present. No enlarged abdominal lymph nodes. Other: No abdominal wall hernia or abnormality. No ascites. Musculoskeletal: No acute or significant osseous findings. IMPRESSION: 1. Intra and extrahepatic biliary ductal dilatation, the common bile duct measuring up to 1.4 cm in caliber. This is unchanged when compared to remote prior CT dated 12/17/2011, and the duct generally tapers to the ampulla without calculus or other obstruction. 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 2. Mild prominence of the pancreatic duct along its length to the ampulla, measuring up to 0.4 cm. This is of uncertain significance, without acute inflammatory findings and as above is similar to prior examination dated 2013. 3. Roux gastric bypass. 4. Large burden of stool throughout the colon. Electronically Signed   By: Marolyn JONETTA Jaksch M.D.   On: 08/05/2023 21:34   US  Abdomen Limited RUQ (LIVER/GB) Result Date: 08/05/2023 CLINICAL DATA:  Transaminitis.  EXAM: ULTRASOUND ABDOMEN LIMITED RIGHT UPPER QUADRANT COMPARISON:  December 17, 2011. FINDINGS: Gallbladder: Status post cholecystectomy. Common bile duct: Diameter: 16 mm in diameter which most likely is due to post cholecystectomy status, but correlation with liver function tests is recommended to rule out obstruction. Liver: No  focal lesion identified. Within normal limits in parenchymal echogenicity. Portal vein is patent on color Doppler imaging with normal direction of blood flow towards the liver. Other: None. IMPRESSION: Status post cholecystectomy. Common bile duct is dilated at 16 mm which most likely is due to post cholecystectomy status, but correlation with liver function tests is recommended to rule out obstruction Electronically Signed   By: Lynwood Landy Raddle M.D.   On: 08/05/2023 17:53    EKG: I independently viewed the EKG done and my findings are as followed: Normal sinus rhythm at rate of 64 bpm  Assessment/Plan Present on Admission:  Transaminitis  Principal Problem:   Transaminitis Active Problems:   Abdominal pain   Nausea   Elevated MCV  Transaminitis AST 915, ALT 269, ALP 211 RUQ ultrasound and MRCP showed findings consistent with post cholecystectomy Hepatitis panel pending Gastroenterology was consulted per EDP and we shall await further recommendations  Abdominal pain and nausea Patient complains of epigastric pain which worsens with drinking milk She endorsed several year history of taking Motrin  once or twice daily It is possible for patient to have had gastritis or ulcer Continue IV Protonix  40 mg twice daily Continue IV hydration  Continue IV morphine  2 mg q.3h p.r.n. for moderate to severe pain Continue IV Compazine  as needed Continue n.p.o. pending gastroenterology consult  Elevated MCV MCV 100.5; vitamin B12 and folate levels will be checked   DVT prophylaxis: SCDs (consider starting chemoprophylaxis if no need for GI intervention).  Code Status:  Full code  Family Communication: None at bedside  Consults: Gastroenterology  Severity of Illness: The appropriate patient status for this patient is INPATIENT. Inpatient status is judged to be reasonable and necessary in order to provide the required intensity of service to ensure the patient's safety. The patient's presenting symptoms, physical exam findings, and initial radiographic and laboratory data in the context of their chronic comorbidities is felt to place them at high risk for further clinical deterioration. Furthermore, it is not anticipated that the patient will be medically stable for discharge from the hospital within 2 midnights of admission.   * I certify that at the point of admission it is my clinical judgment that the patient will require inpatient hospital care spanning beyond 2 midnights from the point of admission due to high intensity of service, high risk for further deterioration and high frequency of surveillance required.*  Author: Alanson Hausmann, DO 08/06/2023 8:03 AM  For on call review [email or web address].

## 2023-08-07 ENCOUNTER — Inpatient Hospital Stay (HOSPITAL_COMMUNITY)

## 2023-08-07 DIAGNOSIS — K921 Melena: Secondary | ICD-10-CM

## 2023-08-07 DIAGNOSIS — R1013 Epigastric pain: Secondary | ICD-10-CM | POA: Diagnosis not present

## 2023-08-07 DIAGNOSIS — R7989 Other specified abnormal findings of blood chemistry: Secondary | ICD-10-CM

## 2023-08-07 DIAGNOSIS — K838 Other specified diseases of biliary tract: Secondary | ICD-10-CM

## 2023-08-07 DIAGNOSIS — I9589 Other hypotension: Secondary | ICD-10-CM

## 2023-08-07 DIAGNOSIS — K5909 Other constipation: Secondary | ICD-10-CM | POA: Diagnosis not present

## 2023-08-07 DIAGNOSIS — R7401 Elevation of levels of liver transaminase levels: Secondary | ICD-10-CM | POA: Diagnosis not present

## 2023-08-07 LAB — CBC
HCT: 34.3 % — ABNORMAL LOW (ref 36.0–46.0)
Hemoglobin: 11.1 g/dL — ABNORMAL LOW (ref 12.0–15.0)
MCH: 32.4 pg (ref 26.0–34.0)
MCHC: 32.4 g/dL (ref 30.0–36.0)
MCV: 100 fL (ref 80.0–100.0)
Platelets: 182 K/uL (ref 150–400)
RBC: 3.43 MIL/uL — ABNORMAL LOW (ref 3.87–5.11)
RDW: 12.1 % (ref 11.5–15.5)
WBC: 4.7 K/uL (ref 4.0–10.5)
nRBC: 0 % (ref 0.0–0.2)

## 2023-08-07 LAB — COMPREHENSIVE METABOLIC PANEL WITH GFR
ALT: 301 U/L — ABNORMAL HIGH (ref 0–44)
AST: 266 U/L — ABNORMAL HIGH (ref 15–41)
Albumin: 2.7 g/dL — ABNORMAL LOW (ref 3.5–5.0)
Alkaline Phosphatase: 214 U/L — ABNORMAL HIGH (ref 38–126)
Anion gap: 7 (ref 5–15)
BUN: 11 mg/dL (ref 8–23)
CO2: 26 mmol/L (ref 22–32)
Calcium: 8 mg/dL — ABNORMAL LOW (ref 8.9–10.3)
Chloride: 105 mmol/L (ref 98–111)
Creatinine, Ser: 0.6 mg/dL (ref 0.44–1.00)
GFR, Estimated: >60 mL/min (ref >60)
Glucose, Bld: 98 mg/dL (ref 70–99)
Potassium: 3.8 mmol/L (ref 3.5–5.1)
Sodium: 138 mmol/L (ref 135–145)
Total Bilirubin: 1 mg/dL (ref 0.0–1.2)
Total Protein: 5.1 g/dL — ABNORMAL LOW (ref 6.5–8.1)

## 2023-08-07 LAB — RAPID URINE DRUG SCREEN, HOSP PERFORMED
Amphetamines: NOT DETECTED
Barbiturates: NOT DETECTED
Benzodiazepines: NOT DETECTED
Cocaine: NOT DETECTED
Opiates: POSITIVE — AB
Tetrahydrocannabinol: NOT DETECTED

## 2023-08-07 LAB — PROTIME-INR
INR: 1.1 (ref 0.8–1.2)
Prothrombin Time: 14.9 s (ref 11.4–15.2)

## 2023-08-07 MED ORDER — KETOROLAC TROMETHAMINE 15 MG/ML IJ SOLN
15.0000 mg | Freq: Four times a day (QID) | INTRAMUSCULAR | Status: DC | PRN
  Administered 2023-08-07 – 2023-08-09 (×7): 15 mg via INTRAVENOUS
  Filled 2023-08-07 (×7): qty 1

## 2023-08-07 MED ORDER — LIDOCAINE 5 % EX PTCH
1.0000 | MEDICATED_PATCH | CUTANEOUS | Status: DC
  Administered 2023-08-07 – 2023-08-12 (×6): 1 via TRANSDERMAL
  Filled 2023-08-07 (×6): qty 1

## 2023-08-07 MED ORDER — BISACODYL 5 MG PO TBEC
10.0000 mg | DELAYED_RELEASE_TABLET | Freq: Once | ORAL | Status: AC
  Administered 2023-08-07: 10 mg via ORAL
  Filled 2023-08-07: qty 2

## 2023-08-07 MED ORDER — DICLOFENAC SODIUM 1 % EX GEL
2.0000 g | Freq: Four times a day (QID) | CUTANEOUS | Status: DC
  Administered 2023-08-07 – 2023-08-12 (×16): 2 g via TOPICAL
  Filled 2023-08-07 (×3): qty 100

## 2023-08-07 MED ORDER — SODIUM CHLORIDE 0.9 % IV SOLN: INTRAVENOUS | Status: AC

## 2023-08-07 MED ORDER — BUSPIRONE HCL 5 MG PO TABS
5.0000 mg | ORAL_TABLET | Freq: Once | ORAL | Status: AC
  Administered 2023-08-07: 5 mg via ORAL

## 2023-08-07 MED ORDER — PEG 3350-KCL-NA BICARB-NACL 420 G PO SOLR
4000.0000 mL | Freq: Once | ORAL | Status: AC
  Administered 2023-08-07: 4000 mL via ORAL

## 2023-08-07 NOTE — Plan of Care (Signed)

## 2023-08-07 NOTE — Progress Notes (Signed)
  Echocardiogram 2D Echocardiogram has been performed.  Vicki Thomas 08/07/2023, 6:01 PM

## 2023-08-07 NOTE — Progress Notes (Signed)
 PROGRESS NOTE    Vicki Thomas  FMW:[id number] DOB: [birth year] DOA: 08/05/2023 PCP: Bevely Doffing, FNP    Brief Narrative:  Vicki Thomas is a 63 y.o. female with medical history significant of anxiety who presents to the emergency department due to epigastric pain which started yesterday.  Pain was intermittent and was of moderate/severe in intensity.  This was associated with nausea and belching without actually vomiting.  EMS was activated on arrival of EMS team, she was given fentanyl 75 mcg and Zofran  4 mg with some improvement the pain.  She states that other times when she had similar pain, the pain usually resolves after drinking some milk, but this time, the pain worsens.  She states that she usually take 1 to 2 tablets of Motrin  once or twice daily for several years.  She denies alcohol, tobacco or any occasional drug use.  Patient endorsed prior history of cholecystectomy and gastric sleeve surgery.     Assessment and Plan: Transaminitis RUQ ultrasound and MRCP showed findings consistent with post cholecystectomy Hepatitis panel normal Gastroenterology consulted: Multiple labs ordered including alpha-1 antitrypsin, iron, varicella-zoster, LDH, anti-smooth muscle, ANA -continue to trend--currently trending down --Avoid hypotension   Abdominal pain and nausea Patient complains of epigastric pain which worsens with drinking milk She endorsed several year history of taking Motrin  once or twice daily ?  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 GI consult: Will not advance past a full liquid diet for now   Elevated MCV MCV 100.5; B12 and folate levels are normal   Hypotension - Not on any oral blood pressure medicines - Added midodrine  - IV fluids while not taking in much by mouth -TSH, consider cortisol if remains hypotensive   DVT prophylaxis: SCDs Start: 08/06/23 0759    Code Status: Full Code Family Communication:   Disposition Plan:  Level of care:  Med-Surg Status is: Inpatient     Consultants:  *Neurology   Subjective: Continues to complain of abdominal pain and feelings of cold  Objective: Vitals:   08/06/23 1504 08/06/23 1922 08/07/23 0410 08/07/23 0424  BP: 99/61 114/72 (!) 88/53 103/69  Pulse: (!) 52 63 61   Resp: 17 14 16    Temp:  98.9 F (37.2 C) 98.7 F (37.1 C)   TempSrc:  Oral Oral   SpO2: 96% 98% 96%   Weight:      Height:        Intake/Output Summary (Last 24 hours) at 08/07/2023 1046 Last data filed at 08/06/2023 2256 Gross per 24 hour  Intake 1746.29 ml  Output --  Net 1746.29 ml   Filed Weights   08/05/23 1419 08/06/23 0224  Weight: 74.2 kg 78.3 kg    Examination:   General: Appearance:     Overweight female who appears weak     Lungs:    respirations unlabored  Heart:    Normal heart rate. Normal rhythm. No murmurs, rubs, or gallops.    MS:   All extremities are intact.    Neurologic:   Awake, alert       Data Reviewed: I have personally reviewed following labs and imaging studies  CBC: Recent Labs  Lab 08/05/23 1526 08/06/23 0844 08/07/23 0823  WBC 6.5 4.2 4.7  NEUTROABS 5.1  --   --   HGB 12.3 11.5* 11.1*  HCT 37.9 34.6* 34.3*  MCV 100.5* 98.9 100.0  PLT 223 186 182   Basic Metabolic Panel: Recent Labs  Lab 08/05/23 1526 08/06/23 0354 08/06/23 0844 08/07/23  0823  NA 140 136  --  138  K 4.3 3.8  --  3.8  CL 101 104  --  105  CO2 27 25  --  26  GLUCOSE 112* 125*  --  98  BUN 15 11  --  11  CREATININE 0.61 0.51  --  0.60  CALCIUM 9.1 8.6*  --  8.0*  MG  --   --  1.9  --   PHOS  --   --  5.0*  --    GFR: Estimated Creatinine Clearance: 78.6 mL/min (by C-G formula based on SCr of 0.6 mg/dL). Liver Function Tests: Recent Labs  Lab 08/05/23 1526 08/06/23 0354 08/07/23 0823  AST 915* 1,085* 266*  ALT 269* 611* 301*  ALKPHOS 211* 295* 214*  BILITOT 0.8 0.9 1.0  PROT 5.8* 5.5* 5.1*  ALBUMIN 3.2* 3.0* 2.7*   Recent Labs  Lab 08/05/23 1526  LIPASE 100*    No results for input(s): AMMONIA  in the last 168 hours. Coagulation Profile: Recent Labs  Lab 08/05/23 1526 08/07/23 0823  INR 1.0 1.1   Cardiac Enzymes: No results for input(s): CKTOTAL, CKMB, CKMBINDEX, TROPONINI in the last 168 hours. BNP (last 3 results) No results for input(s): PROBNP in the last 8760 hours. HbA1C: No results for input(s): HGBA1C in the last 72 hours. CBG: No results for input(s): GLUCAP in the last 168 hours. Lipid Profile: No results for input(s): CHOL, HDL, LDLCALC, TRIG, CHOLHDL, LDLDIRECT in the last 72 hours. Thyroid Function Tests: No results for input(s): TSH, T4TOTAL, FREET4, T3FREE, THYROIDAB in the last 72 hours. Anemia Panel: Recent Labs    08/06/23 0844 08/06/23 1538  VITAMINB12 186  --   FOLATE 16.8  --   FERRITIN  --  23  TIBC  --  352  IRON  --  294*   Sepsis Labs: No results for input(s): PROCALCITON, LATICACIDVEN in the last 168 hours.  No results found for this or any previous visit (from the past 240 hours).       Radiology Studies: MR ABDOMEN MRCP W WO CONTAST Result Date: 08/05/2023 CLINICAL DATA:  Right upper quadrant abdominal pain, common bile duct dilatation identified by ultrasound, status post cholecystectomy EXAM: MRI ABDOMEN WITHOUT AND WITH CONTRAST (INCLUDING MRCP) TECHNIQUE: Multiplanar multisequence MR imaging of the abdomen was performed both before and after the administration of intravenous contrast. Heavily T2-weighted images of the biliary and pancreatic ducts were obtained, and three-dimensional MRCP images were rendered by post processing. CONTRAST:  7.5mL GADAVIST  GADOBUTROL  1 MMOL/ML IV SOLN COMPARISON:  Same-day right upper quadrant ultrasound, CT abdomen pelvis, 12/17/2011 FINDINGS: Lower chest: No acute abnormality. Hepatobiliary: No focal liver abnormality is seen. Status post cholecystectomy. Intra and extrahepatic biliary ductal dilatation, the common bile  duct measuring up to 1.4 cm in caliber. This is unchanged when compared to remote prior CT dated 12/17/2011, in the duct generally tapers to the ampulla without calculus or other obstruction (series 30, image 19). Pancreas: Mild prominence of the pancreatic duct along its length to the ampulla, measuring up to 0.4 cm (series 32, image 21). No inflammatory findings. Spleen: Normal in size without significant abnormality. Adrenals/Urinary Tract: Adrenal glands are unremarkable. Kidneys are normal, without obvious renal calculi, solid lesion, or hydronephrosis. Stomach/Bowel: Roux gastric bypass. No evidence of bowel wall thickening, distention, or inflammatory changes. Large burden of stool throughout the colon. Vascular/Lymphatic: No significant vascular findings are present. No enlarged abdominal lymph nodes. Other: No abdominal wall hernia or abnormality. No  ascites. Musculoskeletal: No acute or significant osseous findings. IMPRESSION: 1. Intra and extrahepatic biliary ductal dilatation, the common bile duct measuring up to 1.4 cm in caliber. This is unchanged when compared to remote prior CT dated 12/17/2011, and the duct generally tapers to the ampulla without calculus or other obstruction. 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 2. Mild prominence of the pancreatic duct along its length to the ampulla, measuring up to 0.4 cm. This is of uncertain significance, without acute inflammatory findings and as above is similar to prior examination dated 2013. 3. Roux gastric bypass. 4. Large burden of stool throughout the colon. Electronically Signed   By: Marolyn JONETTA Jaksch M.D.   On: 08/05/2023 21:34   MR 3D Recon At Scanner Result Date: 08/05/2023 CLINICAL DATA:  Right upper quadrant abdominal pain, common bile duct dilatation identified by ultrasound, status post cholecystectomy EXAM: MRI ABDOMEN WITHOUT AND WITH CONTRAST (INCLUDING MRCP) TECHNIQUE: Multiplanar multisequence MR imaging of  the abdomen was performed both before and after the administration of intravenous contrast. Heavily T2-weighted images of the biliary and pancreatic ducts were obtained, and three-dimensional MRCP images were rendered by post processing. CONTRAST:  7.5mL GADAVIST  GADOBUTROL  1 MMOL/ML IV SOLN COMPARISON:  Same-day right upper quadrant ultrasound, CT abdomen pelvis, 12/17/2011 FINDINGS: Lower chest: No acute abnormality. Hepatobiliary: No focal liver abnormality is seen. Status post cholecystectomy. Intra and extrahepatic biliary ductal dilatation, the common bile duct measuring up to 1.4 cm in caliber. This is unchanged when compared to remote prior CT dated 12/17/2011, in the duct generally tapers to the ampulla without calculus or other obstruction (series 30, image 19). Pancreas: Mild prominence of the pancreatic duct along its length to the ampulla, measuring up to 0.4 cm (series 32, image 21). No inflammatory findings. Spleen: Normal in size without significant abnormality. Adrenals/Urinary Tract: Adrenal glands are unremarkable. Kidneys are normal, without obvious renal calculi, solid lesion, or hydronephrosis. Stomach/Bowel: Roux gastric bypass. No evidence of bowel wall thickening, distention, or inflammatory changes. Large burden of stool throughout the colon. Vascular/Lymphatic: No significant vascular findings are present. No enlarged abdominal lymph nodes. Other: No abdominal wall hernia or abnormality. No ascites. Musculoskeletal: No acute or significant osseous findings. IMPRESSION: 1. Intra and extrahepatic biliary ductal dilatation, the common bile duct measuring up to 1.4 cm in caliber. This is unchanged when compared to remote prior CT dated 12/17/2011, and the duct generally tapers to the ampulla without calculus or other obstruction. 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 2. Mild prominence of the pancreatic duct along its length to the ampulla, measuring up to 0.4 cm.  This is of uncertain significance, without acute inflammatory findings and as above is similar to prior examination dated 2013. 3. Roux gastric bypass. 4. Large burden of stool throughout the colon. Electronically Signed   By: Marolyn JONETTA Jaksch M.D.   On: 08/05/2023 21:34   US  Abdomen Limited RUQ (LIVER/GB) Result Date: 08/05/2023 CLINICAL DATA:  Transaminitis. EXAM: ULTRASOUND ABDOMEN LIMITED RIGHT UPPER QUADRANT COMPARISON:  December 17, 2011. FINDINGS: Gallbladder: Status post cholecystectomy. Common bile duct: Diameter: 16 mm in diameter which most likely is due to post cholecystectomy status, but correlation with liver function tests is recommended to rule out obstruction. Liver: No focal lesion identified. Within normal limits in parenchymal echogenicity. Portal vein is patent on color Doppler imaging with normal direction of blood flow towards the liver. Other: None. IMPRESSION: Status post cholecystectomy. Common bile duct is dilated at 16 mm which most likely is due to post  cholecystectomy status, but correlation with liver function tests is recommended to rule out obstruction Electronically Signed   By: Lynwood Landy Raddle M.D.   On: 08/05/2023 17:53        Scheduled Meds:  benztropine   0.5 mg Oral Daily   pantoprazole  (PROTONIX ) IV  40 mg Intravenous Q12H   polyethylene glycol  17 g Oral TID   Continuous Infusions:  sodium chloride  75 mL/hr at 08/07/23 0213     LOS: 1 day    Time spent: 45 minutes spent on chart review, discussion with nursing staff, consultants, updating family and interview/physical exam; more than 50% of that time was spent in counseling and/or coordination of care.    Harlene RAYMOND Bowl, DO [facility] Available via Epic secure chat 7am-7pm After these hours, please refer to coverage provider listed on [email or web address] 08/07/2023, 10:46 AM

## 2023-08-07 NOTE — Consult Note (Addendum)
 Leshonda Galambos Faizan Nikolette Reindl, M.D. 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  Patient is admitted with elevated liver enzymes for which GI was consulted .  Patient reports that she lives alone and after 8 years she started her schizophrenia medication trazodone .  Patient reports that the reason she came to the hospital is because she felt nauseous and had severe epigastric pain usually is relieved with milk but this time persisted\  Patient denies using any new medications except trazodone , no herbal supplements or alcohol use  Patient reports having severe constipation where she would have 1-2 bowel movements every 2 weeks with much difficulty.  Patient reports last bowel movement day before yesterday with MiraLAX .  Previously tried Linzess  which gave patient diarrhea and hence she stopped using the medication     Acute viral hepatitis negative Hep A IgM negative Hep B surface antigen negative hep B core antibody negative Hep C antibody negative   Ultrasound with CBD dilation 16 mm Patient had cholecystectomy   MRI MRCP with and without contrast demonstrating intra and extrahepatic biliary duct dilation CBD 14 mm which appears to be unchanged since 2013 and no stones were reported   Mild prominence of pancreatic Patient appears to have chronically elevated alk alkaline phosphatase at least since 2013    Past Medical History: SEE CHRONIC ISSSUES: Past Medical History:  Diagnosis Date   Anxiety    Arthritis    Bipolar 1  disorder (HCC)    Chronic back pain    Chronic fatigue    Chronic headaches    Chronic pain    Fibromyalgia    Hiatal hernia    History of borderline personality disorder    Interstitial cystitis    Lymphedema    Panic disorder with agoraphobia    PTSD (post-traumatic stress disorder)    Past Surgical History:  Past Surgical History:  Procedure Laterality Date   ABDOMINAL HYSTERECTOMY     APPENDECTOMY     BARIATRIC SURGERY     CHOLECYSTECTOMY     ESOPHAGUS SURGERY     FOOT SURGERY     HEMORRHOID SURGERY     HERNIA REPAIR     TONSILLECTOMY     Family History:  Family History  Problem Relation Age of Onset   Alcohol abuse Mother    Colon polyps Mother    Bipolar disorder Father    Paranoid behavior Father    Alcohol abuse Father    Bipolar disorder Sister    Anxiety disorder Sister    Alcohol abuse Brother    Drug abuse Brother    Schizophrenia Maternal Grandfather    Anxiety disorder Maternal Aunt    Bipolar disorder Paternal Aunt    Colon polyps Sibling    Social History:  Social History   Tobacco Use   Smoking status: Former    Current packs/day: 1.00    Average packs/day: 1 pack/day for 6.0 years (  6.0 ttl pk-yrs)    Types: Cigarettes   Smokeless tobacco: Never  Vaping Use   Vaping status: Never Used  Substance Use Topics   Alcohol use: No   Drug use: Yes    Types: Marijuana    Home Medications:  Prior to Admission medications   Medication Sig Start Date End Date Taking? Authorizing Provider  aspirin-sod bicarb-citric acid (ALKA-SELTZER) 325 MG TBEF tablet Take 325 mg by mouth every 6 (six) hours as needed (heartburn, indigestion).   Yes [provider]  benztropine  (COGENTIN ) 0.5 MG tablet Take 0.5 mg by mouth daily. 07/13/23  Yes [provider]  bisacodyl  (DULCOLAX) 5 MG EC tablet Take 5 mg by mouth daily as needed for moderate constipation.   Yes [provider]  busPIRone  (BUSPAR ) 7.5 MG tablet Take 1 tablet (7.5 mg  total) by mouth 3 (three) times daily. Patient taking differently: Take 3.75 mg by mouth 3 (three) times daily as needed (anxiety). 07/04/23  Yes Bevely Doffing, FNP  calcium carbonate (TUMS EX) 750 MG chewable tablet Chew 1 tablet by mouth daily as needed for heartburn.   Yes [provider]  clotrimazole  (GYNE-LOTRIMIN ) 1 % vaginal cream Place 1 Applicatorful vaginally at bedtime. 05/17/23  Yes Jadapalle, Sree, MD  ibuprofen  (ADVIL ) 200 MG tablet Take 400 mg by mouth every 6 (six) hours as needed for moderate pain (pain score 4-6).   Yes [provider]  ondansetron  (ZOFRAN -ODT) 8 MG disintegrating tablet Take 1 tablet (8 mg total) by mouth every 8 (eight) hours as needed for nausea or vomiting. 06/16/23  Yes Kennedy Charmaine CROME, NP  polyethylene glycol (MIRALAX  / GLYCOLAX ) 17 g packet Take 17 g by mouth daily as needed for mild constipation.   Yes [provider]  potassium chloride  (K-DUR) 10 MEQ tablet Take 1 tablet (10 mEq total) by mouth daily. Patient taking differently: Take 10 mEq by mouth daily as needed (pot drops, muscle spasms). 04/02/15  Yes Horton, Charmaine FALCON, MD  promethazine  (PHENERGAN ) 12.5 MG tablet Take 12.5 mg by mouth 2 (two) times daily as needed for vomiting or nausea. 06/21/23  Yes [provider]  simethicone  (MYLICON) 125 MG chewable tablet Chew 125 mg by mouth every 6 (six) hours as needed for flatulence.   Yes [provider]  traZODone  (DESYREL ) 50 MG tablet Take 1-2 tablets (50-100 mg total) by mouth at bedtime as needed for sleep. Patient taking differently: Take 50 mg by mouth at bedtime as needed for sleep. 06/08/23  Yes Bevely Doffing, FNP  UZEDY  50 MG/0.14ML SUSY Inject 50 mg into the skin every 14 (fourteen) days. 07/26/23  Yes [provider]    Inpatient Medications:  Current Facility-Administered Medications:    benztropine  (COGENTIN ) tablet 0.5 mg, 0.5 mg, Oral, Daily, Adefeso, Oladapo, DO, 0.5 mg at 08/07/23 1102    bisacodyl  (DULCOLAX) suppository 10 mg, 10 mg, Rectal, Daily PRN, Vann, Jessica U, DO   busPIRone  (BUSPAR ) tablet 3.75 mg, 3.75 mg, Oral, TID PRN, Adefeso, Oladapo, DO   ketorolac  (TORADOL ) 15 MG/ML injection 15 mg, 15 mg, Intravenous, Q6H PRN, Vann, Jessica U, DO, 15 mg at 08/07/23 1053   ondansetron  (ZOFRAN ) injection 4 mg, 4 mg, Intravenous, Q6H PRN, Adefeso, Oladapo, DO, 4 mg at 08/07/23 1055   pantoprazole  (PROTONIX ) injection 40 mg, 40 mg, Intravenous, Q12H, Adefeso, Oladapo, DO, 40 mg at 08/07/23 1052   polyethylene glycol (MIRALAX  / GLYCOLAX ) packet 17 g, 17 g, Oral, TID, Elizer Bostic, Deatrice FALCON, MD, 17 g at 08/07/23  1101   traZODone  (DESYREL ) tablet 50 mg, 50 mg, Oral, QHS PRN, Adefeso, Oladapo, DO, 50 mg at 08/06/23 2352 Allergies: Bee venom, Brexpiprazole , Erythromycin, Penicillins, Abilify [aripiprazole], Aripiprazole, Ciprocin-fluocin-procin [fluocinolone acetonide], Fexofenadine hcl, Fluoxetine hcl, Gabapentin, Haloperidol decanoate, Iodine, Lamotrigine, Lithium , Lurasidone hcl, Marcaine [bupivacaine hcl], Mirtazapine, Paroxetine hcl, Pregabalin, Quetiapine fumarate, Seroquel [quetiapine fumarate], Tape, and Zonisamide  Complete Review of Systems: GENERAL: negative for malaise, night sweats HEENT: No changes in hearing or vision, no nose bleeds or other nasal problems. NECK: Negative for lumps, goiter, pain and significant neck swelling RESPIRATORY: Negative for cough, wheezing CARDIOVASCULAR: Negative for chest pain, leg swelling, palpitations, orthopnea GI: SEE HPI MUSCULOSKELETAL: Negative for joint pain or swelling, back pain, and muscle pain. SKIN: Negative for lesions, rash PSYCH: Negative for sleep disturbance, mood disorder and recent psychosocial stressors. HEMATOLOGY Negative for prolonged bleeding, bruising easily, and swollen nodes. ENDOCRINE: Negative for cold or heat intolerance, polyuria, polydipsia and goiter. NEURO: negative for tremor, gait imbalance, syncope and  seizures. The remainder of the review of systems is noncontributory.  Physical Exam: BP (!) 103/49 (BP Location: Right Arm)   Pulse 64   Temp 98.7 F (37.1 C) (Oral)   Resp 18   Ht 5' 7 (1.702 m)   Wt 78.3 kg   SpO2 96%   BMI 27.04 kg/m  GENERAL: The patient is AO x3, in no acute distress. HEENT: Head is normocephalic and atraumatic. EOMI are intact. Mouth is well hydrated and without lesions. NECK: Supple. No masses LUNGS: Clear to auscultation. No presence of rhonchi/wheezing/rales. Adequate chest expansion HEART: RRR, normal s1 and s2. ABDOMEN: Soft, epigastric tenderness, no guarding, no peritoneal signs, and nondistended. BS +. No masses. EXTREMITIES: Without any cyanosis, clubbing, rash, lesions or edema. NEUROLOGIC: AOx3, no focal motor deficit. SKIN: no jaundice, no rashes  Laboratory Data CBC:     Component Value Date/Time   WBC 4.7 08/07/2023 0823   RBC 3.43 (L) 08/07/2023 0823   HGB 11.1 (L) 08/07/2023 0823   HGB 12.7 07/15/2023 1210   HCT 34.3 (L) 08/07/2023 0823   HCT 40.5 07/15/2023 1210   PLT 182 08/07/2023 0823   PLT 231 07/15/2023 1210   MCV 100.0 08/07/2023 0823   MCV 105 (H) 07/15/2023 1210   MCV 100 07/03/2011 1942   MCH 32.4 08/07/2023 0823   MCHC 32.4 08/07/2023 0823   RDW 12.1 08/07/2023 0823   RDW 11.7 07/15/2023 1210   RDW 14.3 07/03/2011 1942   LYMPHSABS 0.8 08/05/2023 1526   LYMPHSABS 1.7 07/15/2023 1210   LYMPHSABS 0.7 (L) 04/16/2011 0558   MONOABS 0.5 08/05/2023 1526   MONOABS 0.4 04/16/2011 0558   EOSABS 0.1 08/05/2023 1526   EOSABS 0.4 07/15/2023 1210   EOSABS 0.0 04/16/2011 0558   BASOSABS 0.0 08/05/2023 1526   BASOSABS 0.1 07/15/2023 1210   BASOSABS 0.0 04/16/2011 0558   COAG:  Lab Results  Component Value Date   INR 1.1 08/07/2023   INR 1.0 08/05/2023   INR 0.9 04/19/2011    BMP:     Latest Ref Rng & Units 08/07/2023    8:23 AM 08/06/2023    3:54 AM 08/05/2023    3:26 PM  BMP  Glucose 70 - 99 mg/dL 98  874  887    BUN 8 - 23 mg/dL 11  11  15    Creatinine 0.44 - 1.00 mg/dL 9.39  9.48  9.38   Sodium 135 - 145 mmol/L 138  136  140   Potassium 3.5 - 5.1  mmol/L 3.8  3.8  4.3   Chloride 98 - 111 mmol/L 105  104  101   CO2 22 - 32 mmol/L 26  25  27    Calcium 8.9 - 10.3 mg/dL 8.0  8.6  9.1     HEPATIC:     Latest Ref Rng & Units 08/07/2023    8:23 AM 08/06/2023    3:54 AM 08/05/2023    3:26 PM  Hepatic Function  Total Protein 6.5 - 8.1 g/dL 5.1  5.5  5.8   Albumin 3.5 - 5.0 g/dL 2.7  3.0  3.2   AST 15 - 41 U/L 266  1,085  915   ALT 0 - 44 U/L 301  611  269   Alk Phosphatase 38 - 126 U/L 214  295  211   Total Bilirubin 0.0 - 1.2 mg/dL 1.0  0.9  0.8     CARDIAC:  Lab Results  Component Value Date   CKTOTAL 43 11/13/2012   TROPONINI <0.03 04/02/2015     Imaging: I personally reviewed and interpreted the available imaging.  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  Patient is admitted with elevated liver enzymes for which GI was consulted .  #Elevated liver enzymes  #CBD/PD dilation   Hepatocellular pattern elevation liver enzymes which are quickly trending are likely ischemic hepatopathy in setting of documented hypotension  Fortunately intact synthetic function of the liver with normal T. bili and INR.  No encephalopathy noted on exam.no concerns for liver failure at this time   Choledocholithiasis ruled out with MRCP and acute viral hepatitis profile negative  Mild prominence of pancreatic Patient appears to have chronically elevated alk alkaline phosphatase at least since 2013  Recommendation  Keep SBP>9mmHg  Cause of hypertension remains unknown, would recommend getting echo TSH and cortisol Follow up other labs in process:  ANA ASMA AMA, iron profile, total IgG and  IgM , viral serologies Given CBD and mild PD dilation may benefit from EUS as outpatient  Repeat iron profile as outpatient on fasting state and when acute issues are resolved as Tsat>45%  #Epigastric tenderness  Patient does have significant epigastric tenderness and is unable to tolerate much diet except liquids as she have postprandial epigastric pain and nausea.  Endorses intermittent NSAID use  This could be peptic ulcer disease/marginal ulcers and since patient is not tolerating much diet we will plan on upper endoscopy tomorrow  # Painless hematochezia # Chronic severe constipation  Patient has intermittent painless hematochezia in setting of chronic severe constipation as she has a bowel movement every 2 weeks  Patient was previously scheduled for colonoscopy as outpatient but failed to follow instructions.  Patient does not appear to have social support and is requesting that if evaluation can be performed as inpatient as she lives alone  Will plan on colonoscopy tomorrow Bowel prep today, clear liquid diet N.p.o. past midnight   Note will proceed with bidirectional endoscopy only if patient remains hemodynamically stable and no other hypotensive episodes are reported   Don Giarrusso Faizan Samyia Motter, MD Gastroenterology and Hepatology [facility]astro[facility]  This chart has been completed using [facility] Dictation software, and while attempts have been made to ensure accuracy , certain words and phrases may not be transcribed as intended

## 2023-08-08 ENCOUNTER — Inpatient Hospital Stay (HOSPITAL_COMMUNITY)

## 2023-08-08 DIAGNOSIS — K625 Hemorrhage of anus and rectum: Secondary | ICD-10-CM | POA: Diagnosis not present

## 2023-08-08 DIAGNOSIS — R7989 Other specified abnormal findings of blood chemistry: Secondary | ICD-10-CM | POA: Diagnosis not present

## 2023-08-08 DIAGNOSIS — R7401 Elevation of levels of liver transaminase levels: Secondary | ICD-10-CM | POA: Diagnosis not present

## 2023-08-08 DIAGNOSIS — R1013 Epigastric pain: Secondary | ICD-10-CM | POA: Diagnosis not present

## 2023-08-08 DIAGNOSIS — K838 Other specified diseases of biliary tract: Secondary | ICD-10-CM | POA: Diagnosis not present

## 2023-08-08 LAB — COMPREHENSIVE METABOLIC PANEL WITH GFR
ALT: 254 U/L — ABNORMAL HIGH (ref 0–44)
AST: 154 U/L — ABNORMAL HIGH (ref 15–41)
Albumin: 3.4 g/dL — ABNORMAL LOW (ref 3.5–5.0)
Alkaline Phosphatase: 217 U/L — ABNORMAL HIGH (ref 38–126)
Anion gap: 9 (ref 5–15)
BUN: 10 mg/dL (ref 8–23)
CO2: 25 mmol/L (ref 22–32)
Calcium: 8.4 mg/dL — ABNORMAL LOW (ref 8.9–10.3)
Chloride: 101 mmol/L (ref 98–111)
Creatinine, Ser: 0.66 mg/dL (ref 0.44–1.00)
GFR, Estimated: >60 mL/min (ref >60)
Glucose, Bld: 98 mg/dL (ref 70–99)
Potassium: 3.4 mmol/L — ABNORMAL LOW (ref 3.5–5.1)
Sodium: 135 mmol/L (ref 135–145)
Total Bilirubin: 1 mg/dL (ref 0.0–1.2)
Total Protein: 6 g/dL — ABNORMAL LOW (ref 6.5–8.1)

## 2023-08-08 LAB — TSH: TSH: 3.19 u[IU]/mL (ref 0.350–4.500)

## 2023-08-08 LAB — CBC
HCT: 38.3 % (ref 36.0–46.0)
Hemoglobin: 12.1 g/dL (ref 12.0–15.0)
MCH: 31.9 pg (ref 26.0–34.0)
MCHC: 31.6 g/dL (ref 30.0–36.0)
MCV: 101.1 fL — ABNORMAL HIGH (ref 80.0–100.0)
Platelets: 197 K/uL (ref 150–400)
RBC: 3.79 MIL/uL — ABNORMAL LOW (ref 3.87–5.11)
RDW: 12 % (ref 11.5–15.5)
WBC: 5.1 K/uL (ref 4.0–10.5)
nRBC: 0 % (ref 0.0–0.2)

## 2023-08-08 LAB — ECHOCARDIOGRAM COMPLETE
AV Mean grad: 5.6 mmHg
AV Peak grad: 12 mmHg
Ao pk vel: 1.73 m/s
Area-P 1/2: 3.77 cm2
Height: 67 in
S' Lateral: 2.9 cm
Weight: 2761.92 [oz_av]

## 2023-08-08 LAB — VARICELLA ZOSTER ANTIBODY, IGG: Varicella IgG: REACTIVE

## 2023-08-08 LAB — EPSTEIN-BARR VIRUS VCA, IGM: EBV VCA IgM: <36 U/mL (ref 0.0–35.9)

## 2023-08-08 LAB — EPSTEIN-BARR VIRUS VCA, IGG: EBV VCA IgG: >600 U/mL — ABNORMAL HIGH (ref 0.0–17.9)

## 2023-08-08 LAB — HEPATITIS B DNA, ULTRAQUANTITATIVE, PCR
HBV DNA SERPL PCR-ACNC: NOT DETECTED [IU]/mL
HBV DNA SERPL PCR-LOG IU: UNDETERMINED {Log_IU}/mL

## 2023-08-08 LAB — ANTI-SMOOTH MUSCLE ANTIBODY, IGG: F-Actin IgG: 2 U (ref 0–19)

## 2023-08-08 LAB — HCV RNA QUANT: HCV Quantitative: NOT DETECTED [IU]/mL (ref >50)

## 2023-08-08 LAB — VARICELLA ZOSTER ANTIBODY, IGM: Varicella-Zoster Ab, IgM: <0.91 {index} (ref 0.00–0.90)

## 2023-08-08 LAB — MITOCHONDRIAL ANTIBODIES: Mitochondrial M2 Ab, IgG: <20 U (ref 0.0–20.0)

## 2023-08-08 LAB — CORTISOL-AM, BLOOD: Cortisol - AM: 5.4 ug/dL — ABNORMAL LOW (ref 6.7–22.6)

## 2023-08-08 LAB — ANA W/REFLEX IF POSITIVE: Anti Nuclear Antibody (ANA): NEGATIVE

## 2023-08-08 MED ORDER — PEG 3350-KCL-NA BICARB-NACL 420 G PO SOLR
4000.0000 mL | Freq: Once | ORAL | Status: AC
  Administered 2023-08-08: 4000 mL via ORAL

## 2023-08-08 MED ORDER — POTASSIUM CHLORIDE CRYS ER 20 MEQ PO TBCR
40.0000 meq | EXTENDED_RELEASE_TABLET | Freq: Once | ORAL | Status: AC
  Administered 2023-08-08: 40 meq via ORAL
  Filled 2023-08-08: qty 2

## 2023-08-08 MED ORDER — SUCRALFATE 1 GM/10ML PO SUSP
1.0000 g | Freq: Three times a day (TID) | ORAL | Status: DC
  Administered 2023-08-08 – 2023-08-12 (×16): 1 g via ORAL
  Filled 2023-08-08 (×17): qty 10

## 2023-08-08 MED ORDER — COSYNTROPIN 0.25 MG IJ SOLR
0.2500 mg | Freq: Once | INTRAMUSCULAR | Status: AC
  Administered 2023-08-09: 0.25 mg via INTRAVENOUS
  Filled 2023-08-08 (×2): qty 0.25

## 2023-08-08 MED ORDER — BUSPIRONE HCL 5 MG PO TABS
5.0000 mg | ORAL_TABLET | Freq: Three times a day (TID) | ORAL | Status: DC | PRN
  Administered 2023-08-08 – 2023-08-12 (×7): 5 mg via ORAL
  Filled 2023-08-08 (×9): qty 1

## 2023-08-08 MED ORDER — WITCH HAZEL-GLYCERIN EX PADS: MEDICATED_PAD | CUTANEOUS | Status: DC | PRN

## 2023-08-08 MED ORDER — SODIUM CHLORIDE 0.9 % IV SOLN: INTRAVENOUS | Status: DC

## 2023-08-08 NOTE — Progress Notes (Signed)
 Nurse at bedside,patient requesting something for anxiety,Dr Harlene Bowl notified.Plan of care on going.

## 2023-08-08 NOTE — Progress Notes (Addendum)
 Pt drank just over half the Nulytely  by midnight, but had not had a BM by then. On-call MD notified and he agreed that she should keep drinking bowel prep even though she had an order to be NPO after midnight. for the upcoming EGD and colonoscopy. She started having bowel movements around 0200, a total of about 5-6 times by 0645. Her hemorrhoids also started bleeding, with blood dripping on the floor and down her legs. RN ordered Tucks, but was told by the [facility] that there are none in the hospital. The patient held a cold wet washcloth on the hemorrhoids and the bleeding eventually stopped, but starts again every time she has a BM. On call MD notified of bleeding, but did not reply. At this time, she has a third of Nulytely  left to drink and is currently laying in bed with eyes closed. She indicated that she is exhausted and weak from bleeding hemorrhoids. At this time, at 0648, stool is brown and liquid.

## 2023-08-08 NOTE — Progress Notes (Signed)
 Nurse at bedside.Plan of care on going.

## 2023-08-08 NOTE — Progress Notes (Signed)
 Subjective: Reports she wasn't able to finish her bowel prep and does not think she is clean.  She is having liquid brown bowel movements.  States she started having rectal bleeding when she started moving her bowels and started to feel weak.  Also notes when drinking her bowel prep, her epigastric abdominal pain/burning did get worse and she had nausea, but no vomiting.  She has had some chronic intermittent epigastric pain previously improved with drinking milk, but when the symptoms started just prior to admission, they were persistent and unrelieved by milk which is why she presented to the hospital.  Also reports chronic intermittent rectal bleeding and chronic constipation for which she takes 3 laxatives and MiraLAX  every couple of weeks to make her bowels move.   No confusion/mental status changes.   Objective: Vital signs in last 24 hours: Temp:  [97.7 F (36.5 C)-98.8 F (37.1 C)] 97.7 F (36.5 C) (07/14 0527) Pulse Rate:  [52-64] 52 (07/14 0527) Resp:  [18] 18 (07/14 0527) BP: (97-103)/(49-53) 97/50 (07/14 0527) SpO2:  [95 %-98 %] 95 % (07/14 0527) Last BM Date : 08/05/23 General:   Alert and oriented, pleasant, NAD.  Head:  Normocephalic and atraumatic. Abdomen:  Bowel sounds present, soft, non-distended. Moderate TTP in epigastric and RUQ region. No HSM or hernias noted. No rebound or guarding. No masses appreciated  Msk:  Symmetrical without gross deformities. Normal posture. Extremities:  Without edema. Neurologic:  Alert and  oriented x4;  grossly normal neurologically. Skin:  Warm and dry, intact without significant lesions.  Psych:  Normal mood and affect.  Intake/Output from previous day: No intake/output data recorded. Intake/Output this shift: No intake/output data recorded.  Lab Results: Recent Labs    08/06/23 0844 08/07/23 0823 08/08/23 0403  WBC 4.2 4.7 5.1  HGB 11.5* 11.1* 12.1  HCT 34.6* 34.3* 38.3  PLT 186 182 197   BMET Recent Labs     08/06/23 0354 08/07/23 0823 08/08/23 0403  NA 136 138 135  K 3.8 3.8 3.4*  CL 104 105 101  CO2 25 26 25   GLUCOSE 125* 98 98  BUN 11 11 10   CREATININE 0.51 0.60 0.66  CALCIUM 8.6* 8.0* 8.4*   LFT Recent Labs    08/06/23 0354 08/07/23 0823 08/08/23 0403  PROT 5.5* 5.1* 6.0*  ALBUMIN 3.0* 2.7* 3.4*  AST 1,085* 266* 154*  ALT 611* 301* 254*  ALKPHOS 295* 214* 217*  BILITOT 0.9 1.0 1.0   PT/INR Recent Labs    08/05/23 1526 08/07/23 0823  LABPROT 13.2 14.9  INR 1.0 1.1   Hepatitis Panel Recent Labs    08/06/23 0354  HEPBSAG NON REACTIVE  HCVAB NON REACTIVE  HEPAIGM NON REACTIVE  HEPBIGM NON REACTIVE    Studies/Results: ECHOCARDIOGRAM COMPLETE Result Date: 08/08/2023    ECHOCARDIOGRAM REPORT   Patient Name:   Vicki Thomas Date of Exam: 08/07/2023 Medical Rec #:  [id number]        Height:       67.0 in Accession #:    [id number]       Weight:       172.6 lb Date of Birth:  18-Nov-1960         BSA:          1.900 m Patient Age:    63 years         BP:           103/49 mmHg Patient Gender: F  HR:           52 bpm. Exam Location:  [facility] Procedure: 2D Echo (Both Spectral and Color Flow Doppler were utilized during            procedure). Indications:    hypotension  History:        Patient has no prior history of Echocardiogram examinations.  Sonographer:    Tinnie Barefoot RDCS Referring Phys: [phone] JESSICA U VANN IMPRESSIONS  1. Left ventricular ejection fraction, by estimation, is 55 to 60%. The left ventricle has normal function. The left ventricle has no regional wall motion abnormalities. Left ventricular diastolic parameters were normal.  2. Right ventricular systolic function is normal. The right ventricular size is normal. There is mildly elevated pulmonary artery systolic pressure. The estimated right ventricular systolic pressure is 36.9 mmHg.  3. The mitral valve is normal in structure. Trivial mitral valve regurgitation. No evidence of mitral  stenosis.  4. The aortic valve is grossly normal. There is mild calcification of the aortic valve. Aortic valve regurgitation is not visualized. No aortic stenosis is present.  5. The inferior vena cava is dilated in size with <50% respiratory variability, suggesting right atrial pressure of 15 mmHg. FINDINGS  Left Ventricle: Left ventricular ejection fraction, by estimation, is 55 to 60%. The left ventricle has normal function. The left ventricle has no regional wall motion abnormalities. The left ventricular internal cavity size was normal in size. There is  no left ventricular hypertrophy. Left ventricular diastolic parameters were normal. Right Ventricle: The right ventricular size is normal. No increase in right ventricular wall thickness. Right ventricular systolic function is normal. There is mildly elevated pulmonary artery systolic pressure. The tricuspid regurgitant velocity is 2.34  m/s, and with an assumed right atrial pressure of 15 mmHg, the estimated right ventricular systolic pressure is 36.9 mmHg. Left Atrium: Left atrial size was normal in size. Right Atrium: Right atrial size was normal in size. Pericardium: There is no evidence of pericardial effusion. Mitral Valve: The mitral valve is normal in structure. Trivial mitral valve regurgitation. No evidence of mitral valve stenosis. Tricuspid Valve: The tricuspid valve is normal in structure. Tricuspid valve regurgitation is mild . No evidence of tricuspid stenosis. Aortic Valve: The aortic valve is grossly normal. There is mild calcification of the aortic valve. Aortic valve regurgitation is not visualized. No aortic stenosis is present. Aortic valve mean gradient measures 5.6 mmHg. Aortic valve peak gradient measures 12.0 mmHg. Pulmonic Valve: The pulmonic valve was not well visualized. Pulmonic valve regurgitation is trivial. No evidence of pulmonic stenosis. Aorta: The aortic root is normal in size and structure. Venous: The inferior vena cava is  dilated in size with less than 50% respiratory variability, suggesting right atrial pressure of 15 mmHg. IAS/Shunts: No atrial level shunt detected by color flow Doppler.  LEFT VENTRICLE PLAX 2D LVIDd:         4.00 cm Diastology LVIDs:         2.90 cm LV e' medial:    11.90 cm/s LV PW:         0.90 cm LV E/e' medial:  6.7 LV IVS:        0.80 cm LV e' lateral:   15.90 cm/s                        LV E/e' lateral: 5.0  RIGHT VENTRICLE             IVC  RV Basal diam:  3.00 cm     IVC diam: 2.60 cm RV S prime:     11.20 cm/s TAPSE (M-mode): 2.2 cm LEFT ATRIUM             Index        RIGHT ATRIUM           Index LA diam:        3.70 cm 1.95 cm/m   RA Area:     15.10 cm LA Vol (A2C):   52.4 ml 27.58 ml/m  RA Volume:   34.90 ml  18.37 ml/m LA Vol (A4C):   46.5 ml 24.48 ml/m LA Biplane Vol: 49.3 ml 25.95 ml/m  AORTIC VALVE AV Vmax:           173.25 cm/s AV Vmean:          111.414 cm/s AV VTI:            0.376 m AV Peak Grad:      12.0 mmHg AV Mean Grad:      5.6 mmHg LVOT Vmax:         147.00 cm/s LVOT Vmean:        85.800 cm/s LVOT VTI:          0.328 m LVOT/AV VTI ratio: 0.87  AORTA Ao Root diam: 2.60 cm Ao Asc diam:  2.80 cm MITRAL VALVE               TRICUSPID VALVE MV Area (PHT): 3.77 cm    TR Peak grad:   21.9 mmHg MV Decel Time: 201 msec    TR Vmax:        234.00 cm/s MV E velocity: 79.50 cm/s MV A velocity: 52.50 cm/s  SHUNTS MV E/A ratio:  1.51        Systemic VTI: 0.33 m Soyla Merck MD Electronically signed by Soyla Merck MD Signature Date/Time: 08/08/2023/8:13:02 AM    Final     Assessment: 63 year old female with 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  Patient is admitted with elevated liver enzymes for which GI was consulted .   Elevated  LFTs/CBD/PD dilation: Acute hepatocellular pattern LFT elevation with LFTs quickly  trending down, suspected to be secondary to ischemic hepatopathy in the setting of documented hypotension.  Max with AST 1085, ALT 611, alk phos 295.  Today, AST 154, ALT 254, alk phos 217.  Synthetic function has been preserved.  No hepatic encephalopathy.  Choledocholithiasis ruled out with MRCP.  She does have mild prominence of the pancreatic duct . Acute viral hepatitis panel negative. ANA, AMA, ASMA negative. Iron  and iron saturation elevated,  but ferritin wnl. Multiple serologies still pending. Also scheduled  for liver doppler today.   Hypotension: Etiology unclear.  Evaluation and management per hospitalist. BP remains soft this morning at 97/50.  TSH within normal limits.  No significant abnormalities on echocardiogram.  A.m. cortisol mildly low at 5.4.  Epigastric/RUQ pain: Chronic, intermittent epigastric/RUQ abdominal pain previously relieved with milk, but now with persistent symptoms that started just prior to admission and notes worsening symptoms postprandially.  In the setting of intermittent NSAID use and prior gastric bypass, query PUD/marginal ulcers.  She has history of cholecystectomy and MRCP this admission without choledocholithiasis.  She does have mild chronic PD dilation. No pancreatitis.   Planning for EGD tomorrow.  Will also  start carafate  QID as patient describes her pain as burning.   Rectal bleeding:  Chronic intermittent rectal bleeding in the setting of known hemorrhoids and chronic constipation, but unable to rule out other colonic etiology such as polyps or malignancy.  Previously scheduled for outpatient colonoscopy, but failed to follow instructions. Hgb remaining stable despite rectal bleeding this admission with bowel prep. As patient does not appear to have social support, patient has requested to have a colonoscopy completed inpatient.  Initially planned for procedures today, but due to patient not completing bowel prep and still having liquid brown stool, we  will postpone procedures to tomorrow and have her complete additional bowel prep today.  Will start bowel prep around 2 PM to give her some additional time as she does report some worsening abdominal pain and nausea with drinking her bowel prep.  Chronic constipation:  Having bowel movements every 2 weeks with the help of over-the-counter laxatives and MiraLAXprn.  Reports Linzess  previously gave her severe diarrhea. Needs to be on a daily bowel regimen. Could try MiraLAX  BID after colonoscopy. If this isn't strong enough, can try other prescriptive agent.    Plan: Clear liquid diet N.p.o. at midnight Will give another full bowel prep today as she only completed one half bowel prep yesterday and has chronic constipation. Will start at 2 pm to give her some additional time as she does report some worsening abdominal pain and nausea with drinking her bowel prep. Plan for EGD and colonoscopy with Dr. Eartha tomorrow. Continue IV PPI twice daily Start Carafate  1 g 4 times daily Continue antiemetics prn. Evaluation/management of hypotension per hospitalist. Follow-up on pending serologies: viral serologies, IgM, IgG, alpha-1 antitrypsin.  Continue to trend LFTs daily.  Will check INR tomorrow. Recommend repeating iron panel as outpatient on a fasting state when acute illness has resolved. May benefit from outpatient EUS to evaluate chronic pancreatic duct dilation.    LOS: 2 days    08/08/2023, 10:12 AM   Josette Centers, [facility]

## 2023-08-08 NOTE — Plan of Care (Signed)
   Problem: Education: Goal: Knowledge of General Education information will improve Description Including pain rating scale, medication(s)/side effects and non-pharmacologic comfort measures Outcome: Progressing   Problem: Education: Goal: Knowledge of General Education information will improve Description Including pain rating scale, medication(s)/side effects and non-pharmacologic comfort measures Outcome: Progressing

## 2023-08-08 NOTE — Progress Notes (Signed)
 PROGRESS NOTE    Vicki Thomas  FMW:[id number] DOB: 12-Sep-1960 DOA: 08/05/2023 PCP: Bevely Doffing, FNP    Brief Narrative:  Vicki Thomas is a 63 y.o. female with medical history significant of anxiety who presents to the emergency department due to epigastric pain which started yesterday.  Pain was intermittent and was of moderate/severe in intensity.  This was associated with nausea and belching without actually vomiting.  EMS was activated on arrival of EMS team, she was given fentanyl 75 mcg and Zofran  4 mg with some improvement the pain.  She states that other times when she had similar pain, the pain usually resolves after drinking some milk, but this time, the pain worsens.  She states that she usually take 1 to 2 tablets of Motrin  once or twice daily for several years.  She denies alcohol, tobacco or any occasional drug use.  Patient endorsed prior history of cholecystectomy and gastric sleeve surgery.     Assessment and Plan: Transaminitis-due to suspected hypoperfusion from hypotension RUQ ultrasound and MRCP showed findings consistent with post cholecystectomy Hepatitis panel normal Gastroenterology consulted: Multiple labs ordered including alpha-1 antitrypsin, iron, varicella-zoster, LDH, anti-smooth muscle, ANA -continue to trend--currently trending down --Avoid hypotension   Abdominal pain and nausea Patient complains of epigastric pain which worsens with drinking milk She endorsed several year history of taking Motrin  once or twice daily ?  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 GI consult: Plan for EGD/colonoscopy when able/bowel prep finished   Elevated MCV MCV 100.5; B12 and folate levels are normal   Hypotension - Not on any oral blood pressure medicines (patient states blood pressure has been low at home for several months - Added midodrine  - IV fluids while not taking in much by mouth -TSH normal, cortisol low-- will see if stim test can be complete in the  AM   DVT prophylaxis: SCDs Start: 08/06/23 0759    Code Status: Full Code   Disposition Plan:  Level of care: Med-Surg Status is: Inpatient     Consultants:  GI   Subjective: No shortness of breath, no chest pain Tired from going to the bathroom so much overnight  Objective: Vitals:   08/07/23 1306 08/07/23 2053 08/08/23 0527 08/08/23 1051  BP: (!) 103/49 (!) 98/53 (!) 97/50 (!) 115/50  Pulse: 64 (!) 56 (!) 52   Resp: 18 18 18 18   Temp:  98.8 F (37.1 C) 97.7 F (36.5 C) 97.6 F (36.4 C)  TempSrc:  Oral Oral Oral  SpO2: 96% 98% 95% 96%  Weight:      Height:       No intake or output data in the 24 hours ending 08/08/23 1107  Filed Weights   08/05/23 1419 08/06/23 0224  Weight: 74.2 kg 78.3 kg    Examination:   General: Appearance:     Overweight female who appears weak     Lungs:    respirations unlabored  Heart:    Bradycardic. Normal rhythm. No murmurs, rubs, or gallops.    MS:   All extremities are intact.    Neurologic:   Awake, alert       Data Reviewed: I have personally reviewed following labs and imaging studies  CBC: Recent Labs  Lab 08/05/23 1526 08/06/23 0844 08/07/23 0823 08/08/23 0403  WBC 6.5 4.2 4.7 5.1  NEUTROABS 5.1  --   --   --   HGB 12.3 11.5* 11.1* 12.1  HCT 37.9 34.6* 34.3* 38.3  MCV 100.5* 98.9 100.0  101.1*  PLT 223 186 182 197   Basic Metabolic Panel: Recent Labs  Lab 08/05/23 1526 08/06/23 0354 08/06/23 0844 08/07/23 0823 08/08/23 0403  NA 140 136  --  138 135  K 4.3 3.8  --  3.8 3.4*  CL 101 104  --  105 101  CO2 27 25  --  26 25  GLUCOSE 112* 125*  --  98 98  BUN 15 11  --  11 10  CREATININE 0.61 0.51  --  0.60 0.66  CALCIUM 9.1 8.6*  --  8.0* 8.4*  MG  --   --  1.9  --   --   PHOS  --   --  5.0*  --   --    GFR: Estimated Creatinine Clearance: 78.6 mL/min (by C-G formula based on SCr of 0.66 mg/dL). Liver Function Tests: Recent Labs  Lab 08/05/23 1526 08/06/23 0354 08/07/23 0823  08/08/23 0403  AST 915* 1,085* 266* 154*  ALT 269* 611* 301* 254*  ALKPHOS 211* 295* 214* 217*  BILITOT 0.8 0.9 1.0 1.0  PROT 5.8* 5.5* 5.1* 6.0*  ALBUMIN 3.2* 3.0* 2.7* 3.4*   Recent Labs  Lab 08/05/23 1526  LIPASE 100*   No results for input(s): AMMONIA  in the last 168 hours. Coagulation Profile: Recent Labs  Lab 08/05/23 1526 08/07/23 0823  INR 1.0 1.1   Cardiac Enzymes: No results for input(s): CKTOTAL, CKMB, CKMBINDEX, TROPONINI in the last 168 hours. BNP (last 3 results) No results for input(s): PROBNP in the last 8760 hours. HbA1C: No results for input(s): HGBA1C in the last 72 hours. CBG: No results for input(s): GLUCAP in the last 168 hours. Lipid Profile: No results for input(s): CHOL, HDL, LDLCALC, TRIG, CHOLHDL, LDLDIRECT in the last 72 hours. Thyroid Function Tests: Recent Labs    08/08/23 0403  TSH 3.190   Anemia Panel: Recent Labs    08/06/23 0844 08/06/23 1538  VITAMINB12 186  --   FOLATE 16.8  --   FERRITIN  --  23  TIBC  --  352  IRON  --  294*   Sepsis Labs: No results for input(s): PROCALCITON, LATICACIDVEN in the last 168 hours.  No results found for this or any previous visit (from the past 240 hours).       Radiology Studies: US  LIVER DOPPLER Result Date: 08/08/2023 CLINICAL DATA:  212411 Elevated liver enzymes 212411. EXAM: DUPLEX ULTRASOUND OF LIVER TECHNIQUE: Color and duplex Doppler ultrasound was performed to evaluate the hepatic in-flow and out-flow vessels. COMPARISON:  MRI abdomen from 08/05/2023. FINDINGS: Liver: There is increased hepatic echogenicity which reduces the sensitivity of ultrasound for the detection of focal masses. That being said, no focal mass is identified. Normal hepatic contour without nodularity. No intrahepatic biliary ductal dilatation. Main Portal Vein size: 1.25 cm Portal Vein Velocities Main Prox:  34.3 cm/sec Main Mid: 38.1 cm/sec Main Dist:  27.5 cm/sec Right: 36.1  cm/sec Left: 25.1 cm/sec Hepatic Vein Velocities Right:  38.5 cm/sec Middle:  47.1 cm/sec Left:  50.0 cm/sec IVC: Present and patent with normal respiratory phasicity. Hepatic Artery Velocity:  61.0 cm/sec Splenic Vein Velocity:  25.6 cm/sec Spleen: 3.2 cm x 10.2 cm x 11.2 cm with a total volume of 192 cm^3 (411 cm^3 is upper limit normal) Portal Vein Occlusion/Thrombus: No Splenic Vein Occlusion/Thrombus: No Ascites: None Varices: None IMPRESSION: 1. Increased hepatic echogenicity which is nonspecific but most likely secondary to hepatic steatosis. 2. Unremarkable liver Doppler exam. Electronically Signed   By:  Ree Molt M.D.   On: 08/08/2023 10:17   ECHOCARDIOGRAM COMPLETE Result Date: 08/08/2023    ECHOCARDIOGRAM REPORT   Patient Name:   Vicki Thomas Date of Exam: 08/07/2023 Medical Rec #:  [id number]        Height:       67.0 in Accession #:    [id number]       Weight:       172.6 lb Date of Birth:  09-21-1960         BSA:          1.900 m Patient Age:    63 years         BP:           103/49 mmHg Patient Gender: F                HR:           52 bpm. Exam Location:  [facility] Procedure: 2D Echo (Both Spectral and Color Flow Doppler were utilized during            procedure). Indications:    hypotension  History:        Patient has no prior history of Echocardiogram examinations.  Sonographer:    Tinnie Barefoot RDCS Referring Phys: [phone] Quintavius Niebuhr U Kelin Nixon IMPRESSIONS  1. Left ventricular ejection fraction, by estimation, is 55 to 60%. The left ventricle has normal function. The left ventricle has no regional wall motion abnormalities. Left ventricular diastolic parameters were normal.  2. Right ventricular systolic function is normal. The right ventricular size is normal. There is mildly elevated pulmonary artery systolic pressure. The estimated right ventricular systolic pressure is 36.9 mmHg.  3. The mitral valve is normal in structure. Trivial mitral valve regurgitation. No evidence of mitral  stenosis.  4. The aortic valve is grossly normal. There is mild calcification of the aortic valve. Aortic valve regurgitation is not visualized. No aortic stenosis is present.  5. The inferior vena cava is dilated in size with <50% respiratory variability, suggesting right atrial pressure of 15 mmHg. FINDINGS  Left Ventricle: Left ventricular ejection fraction, by estimation, is 55 to 60%. The left ventricle has normal function. The left ventricle has no regional wall motion abnormalities. The left ventricular internal cavity size was normal in size. There is  no left ventricular hypertrophy. Left ventricular diastolic parameters were normal. Right Ventricle: The right ventricular size is normal. No increase in right ventricular wall thickness. Right ventricular systolic function is normal. There is mildly elevated pulmonary artery systolic pressure. The tricuspid regurgitant velocity is 2.34  m/s, and with an assumed right atrial pressure of 15 mmHg, the estimated right ventricular systolic pressure is 36.9 mmHg. Left Atrium: Left atrial size was normal in size. Right Atrium: Right atrial size was normal in size. Pericardium: There is no evidence of pericardial effusion. Mitral Valve: The mitral valve is normal in structure. Trivial mitral valve regurgitation. No evidence of mitral valve stenosis. Tricuspid Valve: The tricuspid valve is normal in structure. Tricuspid valve regurgitation is mild . No evidence of tricuspid stenosis. Aortic Valve: The aortic valve is grossly normal. There is mild calcification of the aortic valve. Aortic valve regurgitation is not visualized. No aortic stenosis is present. Aortic valve mean gradient measures 5.6 mmHg. Aortic valve peak gradient measures 12.0 mmHg. Pulmonic Valve: The pulmonic valve was not well visualized. Pulmonic valve regurgitation is trivial. No evidence of pulmonic stenosis. Aorta: The aortic root is normal in size and structure.  Venous: The inferior vena cava is  dilated in size with less than 50% respiratory variability, suggesting right atrial pressure of 15 mmHg. IAS/Shunts: No atrial level shunt detected by color flow Doppler.  LEFT VENTRICLE PLAX 2D LVIDd:         4.00 cm Diastology LVIDs:         2.90 cm LV e' medial:    11.90 cm/s LV PW:         0.90 cm LV E/e' medial:  6.7 LV IVS:        0.80 cm LV e' lateral:   15.90 cm/s                        LV E/e' lateral: 5.0  RIGHT VENTRICLE             IVC RV Basal diam:  3.00 cm     IVC diam: 2.60 cm RV S prime:     11.20 cm/s TAPSE (M-mode): 2.2 cm LEFT ATRIUM             Index        RIGHT ATRIUM           Index LA diam:        3.70 cm 1.95 cm/m   RA Area:     15.10 cm LA Vol (A2C):   52.4 ml 27.58 ml/m  RA Volume:   34.90 ml  18.37 ml/m LA Vol (A4C):   46.5 ml 24.48 ml/m LA Biplane Vol: 49.3 ml 25.95 ml/m  AORTIC VALVE AV Vmax:           173.25 cm/s AV Vmean:          111.414 cm/s AV VTI:            0.376 m AV Peak Grad:      12.0 mmHg AV Mean Grad:      5.6 mmHg LVOT Vmax:         147.00 cm/s LVOT Vmean:        85.800 cm/s LVOT VTI:          0.328 m LVOT/AV VTI ratio: 0.87  AORTA Ao Root diam: 2.60 cm Ao Asc diam:  2.80 cm MITRAL VALVE               TRICUSPID VALVE MV Area (PHT): 3.77 cm    TR Peak grad:   21.9 mmHg MV Decel Time: 201 msec    TR Vmax:        234.00 cm/s MV E velocity: 79.50 cm/s MV A velocity: 52.50 cm/s  SHUNTS MV E/A ratio:  1.51        Systemic VTI: 0.33 m Soyla Merck MD Electronically signed by Soyla Merck MD Signature Date/Time: 08/08/2023/8:13:02 AM    Final         Scheduled Meds:  benztropine   0.5 mg Oral Daily   diclofenac  Sodium  2 g Topical QID   lidocaine   1 patch Transdermal Q24H   pantoprazole  (PROTONIX ) IV  40 mg Intravenous Q12H   polyethylene glycol-electrolytes  4,000 mL Oral Once   sucralfate   1 g Oral TID WC & HS   Continuous Infusions:  sodium chloride  75 mL/hr at 08/08/23 1103     LOS: 2 days    Time spent: 45 minutes spent on chart review,  discussion with nursing staff, consultants, updating family and interview/physical exam; more than 50% of that time was spent in counseling and/or coordination of  care.    Harlene RAYMOND Bowl, DO [facility] Available via Epic secure chat 7am-7pm After these hours, please refer to coverage provider listed on [email or web address] 08/08/2023, 11:07 AM

## 2023-08-08 NOTE — Progress Notes (Signed)
 Nurse at bedside,New IV started 22 gauge to left posterior arm,patient tolerated procedure times two attempts. Plan of care on going.

## 2023-08-08 NOTE — Progress Notes (Signed)
 Nurse at bedside.Nulytely  solution started and given by mouth per orders.Plan of care on going.

## 2023-08-08 NOTE — Progress Notes (Signed)
 Nurse at bedside ,patient alert and oriented times four.Patient c/o upper epigastric pain rated a eight,a burning sensation that was off and on,also c/o some nausea,patient was given Toradol  15 mg's IV for pain,and zofran  4 mg's IV for nausea,per MAR prn.Plan of care on going.

## 2023-08-08 NOTE — Evaluation (Signed)
 Physical Therapy Evaluation Patient Details Name: Vicki Thomas MRN: [id number] DOB: 1960/09/07 Today's Date: 08/08/2023  History of Present Illness  Vicki Thomas is a 63 y.o. female with medical history significant of anxiety who presents to the emergency department due to epigastric pain which started yesterday.  Pain was intermittent and was of moderate/severe in intensity.  This was associated with nausea and belching without actually vomiting.  EMS was activated on arrival of EMS team, she was given fentanyl 75 mcg and Zofran  4 mg with some improvement the pain.  She states that other times when she had similar pain, the pain usually resolves after drinking some milk, but this time, the pain worsens.  She states that she usually take 1 to 2 tablets of Motrin  once or twice daily for several years.  She denies alcohol, tobacco or any occasional drug use.  Patient endorsed prior history of cholecystectomy and gastric sleeve surgery.   Clinical Impression  Patient functioning near baseline for functional mobility and gait with good return for ambulating in room, hallway using RW without loss of balance, limited mostly due to c/o dizziness.  Patient encouraged to ambulate ad lib in room and with nursing staff in hallways. Plan:  Patient discharged from physical therapy to care of nursing for ambulation daily as tolerated for length of stay.          If plan is discharge home, recommend the following: Help with stairs or ramp for entrance   Can travel by private vehicle        Equipment Recommendations None recommended by PT  Recommendations for Other Services       Functional Status Assessment Patient has had a recent decline in their functional status and/or demonstrates limited ability to make significant improvements in function in a reasonable and predictable amount of time     Precautions / Restrictions Precautions Precautions: Fall Restrictions Weight Bearing Restrictions  Per Provider Order: No      Mobility  Bed Mobility Overal bed mobility: Independent                  Transfers Overall transfer level: Modified independent                      Ambulation/Gait Ambulation/Gait assistance: Modified independent (Device/Increase time), Supervision Gait Distance (Feet): 65 Feet Assistive device: Rolling walker (2 wheels) Gait Pattern/deviations: Decreased step length - right, Decreased step length - left, Decreased stride length Gait velocity: decreased     General Gait Details: slightly labored movement without loss of balance using RW, limited mostly due to c/o dizziness  Stairs            Wheelchair Mobility     Tilt Bed    Modified Rankin (Stroke Patients Only)       Balance Overall balance assessment: Needs assistance Sitting-balance support: Feet supported, No upper extremity supported Sitting balance-Leahy Scale: Good Sitting balance - Comments: seated at EOB   Standing balance support: During functional activity, Bilateral upper extremity supported Standing balance-Leahy Scale: Good Standing balance comment: using RW                             Pertinent Vitals/Pain Pain Assessment Pain Assessment: 0-10 Pain Score: 8  Pain Location: stomach Pain Descriptors / Indicators: Aching Pain Intervention(s): Limited activity within patient's tolerance, Monitored during session    Home Living Family/patient expects to be discharged to:: Private  residence Living Arrangements: Alone Available Help at Discharge: Family;Available PRN/intermittently Type of Home: Apartment Home Access: Level entry       Home Layout: One level Home Equipment: Rollator (4 wheels);Shower seat      Prior Function Prior Level of Function : Independent/Modified Independent             Mobility Comments: household ambulation using Rollator ADLs Comments: reported modI for ADLs, cooks very minimally, often  microwaves meals     Extremity/Trunk Assessment   Upper Extremity Assessment Upper Extremity Assessment: Defer to OT evaluation    Lower Extremity Assessment Lower Extremity Assessment: Overall WFL for tasks assessed    Cervical / Trunk Assessment Cervical / Trunk Assessment: Normal  Communication   Communication Communication: No apparent difficulties    Cognition Arousal: Alert Behavior During Therapy: WFL for tasks assessed/performed                             Following commands: Intact       Cueing Cueing Techniques: Verbal cues     General Comments      Exercises     Assessment/Plan    PT Assessment Patient does not need any further PT services  PT Problem List         PT Treatment Interventions      PT Goals (Current goals can be found in the Care Plan section)  Acute Rehab PT Goals Patient Stated Goal: return home PT Goal Formulation: With patient Time For Goal Achievement: 08/08/23 Potential to Achieve Goals: Good    Frequency       Co-evaluation               AM-PAC PT 6 Clicks Mobility  Outcome Measure Help needed turning from your back to your side while in a flat bed without using bedrails?: None Help needed moving from lying on your back to sitting on the side of a flat bed without using bedrails?: None Help needed moving to and from a bed to a chair (including a wheelchair)?: None Help needed standing up from a chair using your arms (e.g., wheelchair or bedside chair)?: None Help needed to walk in hospital room?: A Little Help needed climbing 3-5 steps with a railing? : A Little 6 Click Score: 22    End of Session   Activity Tolerance: Patient tolerated treatment well;Patient limited by fatigue;Other (comment) (limited by dizziness) Patient left: in bed Nurse Communication: Mobility status PT Visit Diagnosis: Unsteadiness on feet (R26.81);Other abnormalities of gait and mobility (R26.89);Muscle weakness  (generalized) (M62.81)    Time: 8965-8955 PT Time Calculation (min) (ACUTE ONLY): 10 min   Charges:   PT Evaluation $PT Eval Low Complexity: 1 Low PT Treatments $Therapeutic Activity: 8-22 mins PT General Charges $$ ACUTE PT VISIT: 1 Visit         3:52 PM, 08/08/23 Lynwood Music, MPT Physical Therapist with [facility] [phone] office [phone] mobile phone

## 2023-08-09 ENCOUNTER — Inpatient Hospital Stay (HOSPITAL_COMMUNITY): Admitting: Anesthesiology

## 2023-08-09 ENCOUNTER — Encounter (HOSPITAL_COMMUNITY)
Admission: EM | Disposition: A | Discharge status: Home / Self Care | Payer: Self-pay | Source: Home / Self Care | Attending: Internal Medicine

## 2023-08-09 ENCOUNTER — Telehealth: Payer: Self-pay | Admitting: Gastroenterology

## 2023-08-09 DIAGNOSIS — K5730 Diverticulosis of large intestine without perforation or abscess without bleeding: Secondary | ICD-10-CM

## 2023-08-09 DIAGNOSIS — K289 Gastrojejunal ulcer, unspecified as acute or chronic, without hemorrhage or perforation: Secondary | ICD-10-CM

## 2023-08-09 DIAGNOSIS — K644 Residual hemorrhoidal skin tags: Secondary | ICD-10-CM

## 2023-08-09 DIAGNOSIS — Z9884 Bariatric surgery status: Secondary | ICD-10-CM

## 2023-08-09 DIAGNOSIS — K625 Hemorrhage of anus and rectum: Secondary | ICD-10-CM | POA: Diagnosis not present

## 2023-08-09 DIAGNOSIS — K5909 Other constipation: Secondary | ICD-10-CM

## 2023-08-09 DIAGNOSIS — D128 Benign neoplasm of rectum: Secondary | ICD-10-CM

## 2023-08-09 DIAGNOSIS — R7401 Elevation of levels of liver transaminase levels: Secondary | ICD-10-CM | POA: Diagnosis not present

## 2023-08-09 DIAGNOSIS — R1013 Epigastric pain: Secondary | ICD-10-CM

## 2023-08-09 DIAGNOSIS — K648 Other hemorrhoids: Secondary | ICD-10-CM

## 2023-08-09 DIAGNOSIS — R109 Unspecified abdominal pain: Secondary | ICD-10-CM

## 2023-08-09 DIAGNOSIS — D123 Benign neoplasm of transverse colon: Secondary | ICD-10-CM

## 2023-08-09 HISTORY — PX: COLONOSCOPY: SHX5424

## 2023-08-09 HISTORY — PX: ESOPHAGOGASTRODUODENOSCOPY: SHX5428

## 2023-08-09 LAB — BASIC METABOLIC PANEL WITH GFR
Anion gap: 8 (ref 5–15)
BUN: 7 mg/dL — ABNORMAL LOW (ref 8–23)
CO2: 24 mmol/L (ref 22–32)
Calcium: 8.1 mg/dL — ABNORMAL LOW (ref 8.9–10.3)
Chloride: 107 mmol/L (ref 98–111)
Creatinine, Ser: 0.52 mg/dL (ref 0.44–1.00)
GFR, Estimated: >60 mL/min (ref >60)
Glucose, Bld: 94 mg/dL (ref 70–99)
Potassium: 3.8 mmol/L (ref 3.5–5.1)
Sodium: 139 mmol/L (ref 135–145)

## 2023-08-09 LAB — CBC
HCT: 31.7 % — ABNORMAL LOW (ref 36.0–46.0)
Hemoglobin: 10.1 g/dL — ABNORMAL LOW (ref 12.0–15.0)
MCH: 31.9 pg (ref 26.0–34.0)
MCHC: 31.9 g/dL (ref 30.0–36.0)
MCV: 100 fL (ref 80.0–100.0)
Platelets: 166 K/uL (ref 150–400)
RBC: 3.17 MIL/uL — ABNORMAL LOW (ref 3.87–5.11)
RDW: 11.8 % (ref 11.5–15.5)
WBC: 4.2 K/uL (ref 4.0–10.5)
nRBC: 0 % (ref 0.0–0.2)

## 2023-08-09 LAB — ALPHA-1-ANTITRYPSIN: A-1 Antitrypsin, Ser: 86 mg/dL — ABNORMAL LOW (ref 101–187)

## 2023-08-09 LAB — IGM: IgM (Immunoglobulin M), Srm: 28 mg/dL (ref 26–217)

## 2023-08-09 LAB — PROTIME-INR
INR: 1.1 (ref 0.8–1.2)
Prothrombin Time: 14.6 s (ref 11.4–15.2)

## 2023-08-09 LAB — ACTH STIMULATION, 3 TIME POINTS
Cortisol, 30 Min: 9.1 ug/dL
Cortisol, 60 Min: 19.5 ug/dL
Cortisol, Base: 2.7 ug/dL

## 2023-08-09 LAB — IGG: IgG (Immunoglobin G), Serum: 497 mg/dL — ABNORMAL LOW (ref 586–1602)

## 2023-08-09 SURGERY — EGD (ESOPHAGOGASTRODUODENOSCOPY)
Anesthesia: General

## 2023-08-09 MED ORDER — BOOST / RESOURCE BREEZE PO LIQD CUSTOM
1.0000 | Freq: Three times a day (TID) | ORAL | Status: DC
  Administered 2023-08-10 (×2): 1 via ORAL

## 2023-08-09 MED ORDER — SODIUM CHLORIDE 0.9% FLUSH: 3.0000 mL | INTRAVENOUS | Status: DC | PRN

## 2023-08-09 MED ORDER — PANTOPRAZOLE SODIUM 40 MG PO TBEC
40.0000 mg | DELAYED_RELEASE_TABLET | Freq: Every day | ORAL | Status: DC
  Administered 2023-08-09 – 2023-08-11 (×3): 40 mg via ORAL
  Filled 2023-08-09 (×4): qty 1

## 2023-08-09 MED ORDER — PROPOFOL 500 MG/50ML IV EMUL
INTRAVENOUS | Status: DC | PRN
  Administered 2023-08-09: 100 ug/kg/min via INTRAVENOUS
  Administered 2023-08-09: 100 mg via INTRAVENOUS
  Administered 2023-08-09: 40 mg via INTRAVENOUS

## 2023-08-09 MED ORDER — SODIUM CHLORIDE 0.9% FLUSH
3.0000 mL | Freq: Two times a day (BID) | INTRAVENOUS | Status: DC
  Administered 2023-08-09: 10 mL via INTRAVENOUS

## 2023-08-09 MED ORDER — TRAMADOL HCL 50 MG PO TABS
50.0000 mg | ORAL_TABLET | Freq: Four times a day (QID) | ORAL | Status: DC | PRN
  Administered 2023-08-09 – 2023-08-11 (×4): 50 mg via ORAL
  Filled 2023-08-09 (×5): qty 1

## 2023-08-09 MED ORDER — HYDROCORTISONE ACETATE 25 MG RE SUPP
25.0000 mg | Freq: Two times a day (BID) | RECTAL | Status: DC
  Administered 2023-08-09 – 2023-08-12 (×7): 25 mg via RECTAL
  Filled 2023-08-09 (×7): qty 1

## 2023-08-09 MED ORDER — LACTATED RINGERS IV SOLN: INTRAVENOUS | Status: DC | PRN

## 2023-08-09 MED ORDER — LIDOCAINE 2% (20 MG/ML) 5 ML SYRINGE
INTRAMUSCULAR | Status: DC | PRN
  Administered 2023-08-09: 80 mg via INTRAVENOUS

## 2023-08-09 MED ORDER — ONDANSETRON HCL 4 MG/2ML IJ SOLN
INTRAMUSCULAR | Status: DC | PRN
  Administered 2023-08-09: 4 mg via INTRAVENOUS

## 2023-08-09 NOTE — Op Note (Signed)
 [facility] Patient Name: Vicki Thomas Procedure Date: 08/09/2023 1:35 PM MRN: [id number] Date of Birth: 10-12-1960 Attending MD: Toribio Fortune , , [id number] CSN: [id number] Age: 63 Admit Type: Outpatient Procedure:                Colonoscopy Indications:              Rectal bleeding Providers:                Toribio Fortune, Madelin Hunter, RN, [place] Wilson,                            Technician Referring MD:              Medicines:                Monitored Anesthesia Care Complications:            No immediate complications. Estimated Blood Loss:     Estimated blood loss: none. Procedure:                Pre-Anesthesia Assessment:                           - Prior to the procedure, a History and Physical                            was performed, and patient medications, allergies                            and sensitivities were reviewed. The patient's                            tolerance of previous anesthesia was reviewed.                           - The risks and benefits of the procedure and the                            sedation options and risks were discussed with the                            patient. All questions were answered and informed                            consent was obtained.                           - ASA Grade Assessment: III - A patient with severe                            systemic disease.                           After obtaining informed consent, the colonoscope                            was passed under direct vision. Throughout the  procedure, the patient's blood pressure, pulse, and                            oxygen saturations were monitored continuously. The                            [id number]-HQ190L ([id number]) scope was introduced through                            the anus and advanced to the the terminal ileum.                            The colonoscopy was performed without difficulty.                             The patient tolerated the procedure well. The                            quality of the bowel preparation was adequate. Scope In: 2:21:59 PM Scope Out: 3:01:00 PM Scope Withdrawal Time: 0 hours 22 minutes 45 seconds  Total Procedure Duration: 0 hours 39 minutes 1 second  Findings:      Hemorrhoids were found on perianal exam.      Eight sessile polyps were found in the transverse colon. The polyps were       2 to 5 mm in size. These polyps were removed with a cold snare.       Resection and retrieval were complete.      A 3 mm polyp was found in the rectum. The polyp was sessile. The polyp       was removed with a cold snare. Resection and retrieval were complete.      Scattered medium-mouthed and small-mouthed diverticula were found in the       sigmoid colon, descending colon and ascending colon.      Non-bleeding external and internal hemorrhoids were found during       retroflexion and during perianal exam. The hemorrhoids were medium-sized. Impression:               - Hemorrhoids found on perianal exam.                           - Eight 2 to 5 mm polyps in the transverse colon,                            removed with a cold snare. Resected and retrieved.                           - One 3 mm polyp in the rectum, removed with a cold                            snare. Resected and retrieved.                           - Diverticulosis in the sigmoid colon, in the  descending colon and in the ascending colon.                           - Non-bleeding external and internal hemorrhoids. Moderate Sedation:      Per Anesthesia Care Recommendation:           - Return patient to hospital ward for ongoing care.                           - Resume previous diet.                           - Await pathology results.                           - Repeat colonoscopy for surveillance based on                            pathology results.                           - Anusol   suppositories every 12 hours for 10 days. Procedure Code(s):        --- Professional ---                           [phone], Colonoscopy, flexible; with removal of                            tumor(s), polyp(s), or other lesion(s) by snare                            technique Diagnosis Code(s):        --- Professional ---                           D12.3, Benign neoplasm of transverse colon (hepatic                            flexure or splenic flexure)                           D12.8, Benign neoplasm of rectum                           K64.8, Other hemorrhoids                           K62.5, Hemorrhage of anus and rectum                           K57.30, Diverticulosis of large intestine without                            perforation or abscess without bleeding CPT copyright 2022 American [employer]. All rights reserved. The codes documented in this report are preliminary and upon coder review may  be revised to meet current compliance requirements. Toribio Fortune, MD Toribio Fortune,  08/09/2023 3:12:03 PM This report has been signed electronically. Number of Addenda: 0

## 2023-08-09 NOTE — Progress Notes (Signed)
 We will proceed with EGD and colonoscopy as scheduled.  I thoroughly discussed with the patient the procedure, including the risks involved. Patient understands what the procedure involves including the benefits and any risks. Patient understands alternatives to the proposed procedure. Risks including (but not limited to) bleeding, tearing of the lining (perforation), rupture of adjacent organs, problems with heart and lung function, infection, and medication reactions. A small percentage of complications may require surgery, hospitalization, repeat endoscopic procedure, and/or transfusion.  Patient understood and agreed.  Katrinka Blazing, MD Gastroenterology and Hepatology [facility] Gastroenterology

## 2023-08-09 NOTE — Progress Notes (Signed)
 Nurse at bedside,patient arrived back to room from procedure,Blood pressure was 142/83,heart rate 45,Dr Harlene Bowl notified.Plan of care on going.Vicki Thomas

## 2023-08-09 NOTE — Progress Notes (Signed)
 Nurse at bedside,patient alert and oriented times four.Blood pressure 101/45,heart rate 52,Dr Harlene Bowl notified.Patient did not complete bowel prep for EGD MD aware. Plan of care on going.

## 2023-08-09 NOTE — Plan of Care (Signed)
   Problem: Education: Goal: Knowledge of General Education information will improve Description Including pain rating scale, medication(s)/side effects and non-pharmacologic comfort measures Outcome: Progressing   Problem: Education: Goal: Knowledge of General Education information will improve Description Including pain rating scale, medication(s)/side effects and non-pharmacologic comfort measures Outcome: Progressing

## 2023-08-09 NOTE — Telephone Encounter (Signed)
 Please arrange hospital follow-up in 3-4 weeks.

## 2023-08-09 NOTE — Progress Notes (Signed)
 OT Cancellation Note  Patient Details Name: Vicki Thomas MRN: [id number] DOB: 1960-04-09   Cancelled Treatment:    Reason Eval/Treat Not Completed: OT screened, no needs identified, will sign off. Per chart review and discussion with the evaluating physical therapist, the pt is doing well and is not in need of an OT evaluation at this time. Pt will be removed from the OT list.   JAYSON PERSON OT, MOT   JAYSON PERSON 08/09/2023, 8:01 AM

## 2023-08-09 NOTE — Progress Notes (Addendum)
 PROGRESS NOTE    Vicki Thomas  FMW:[id number] DOB: May 26, 1960 DOA: 08/05/2023 PCP: Bevely Doffing, FNP    Brief Narrative:  Vicki Thomas is a 63 y.o. female with medical history significant of anxiety who presents to the emergency department due to epigastric pain which started yesterday.  Pain was intermittent and was of moderate/severe in intensity.  This was associated with nausea and belching without actually vomiting.  EMS was activated on arrival of EMS team, she was given fentanyl 75 mcg and Zofran  4 mg with some improvement the pain.  She states that other times when she had similar pain, the pain usually resolves after drinking some milk, but this time, the pain worsens.  She states that she usually take 1 to 2 tablets of Motrin  once or twice daily for several years.  She denies alcohol, tobacco or any occasional drug use.  Patient endorsed prior history of cholecystectomy and gastric sleeve surgery.  Patient is getting EGD/colonoscopy, but this has been delayed due to poor bowel prep on 7/14.  Hospital stay has also been complicated by hypotension but this may be patient's baseline and chronic-workup in process.     Assessment and Plan: Transaminitis-due to suspected hypoperfusion from hypotension RUQ ultrasound and MRCP showed findings consistent with post cholecystectomy Hepatitis panel normal Gastroenterology consulted: Multiple labs ordered including alpha-1 antitrypsin, iron, varicella-zoster, LDH, anti-smooth muscle, ANA -continue to trend--currently trending down --Avoid hypotension   Abdominal pain and nausea Patient complains of epigastric pain which worsens with drinking milk She endorsed several year history of taking Motrin  once or twice daily ?  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 GI consult: Plan for EGD/colonoscopy when able/bowel prep finished   Elevated MCV MCV 100.5; B12 and folate levels are normal   Hypotension - Not on any oral blood pressure  medicines (patient states blood pressure has been low at home for several months) - Added midodrine  - IV fluids while not taking in much by mouth -Echo unrevealing -TSH normal, cortisol low--cosyntropin  stim test pending  Schizophrenia - On mood as a - Patient is still in the hospital on 7/25, she will need to get that injection  DVT prophylaxis: SCDs Start: 08/06/23 0759    Code Status: Full Code   Disposition Plan:  Level of care: Med-Surg Status is: Inpatient     Consultants:  GI   Subjective: Denies dizziness when getting up  Objective: Vitals:   08/08/23 1311 08/08/23 1950 08/09/23 0459 08/09/23 0838  BP: (!) 108/51 (!) 98/49 (!) 107/53 (!) 101/45  Pulse: 60 (!) 53 (!) 51 (!) 52  Resp: 16 18 18 18   Temp: 98.3 F (36.8 C) 98.9 F (37.2 C) 97.9 F (36.6 C) 98 F (36.7 C)  TempSrc: Oral Oral Oral Oral  SpO2: 98% 98% 100% 98%  Weight:      Height:        Intake/Output Summary (Last 24 hours) at 08/09/2023 1028 Last data filed at 08/08/2023 1735 Gross per 24 hour  Intake 305.65 ml  Output --  Net 305.65 ml    Filed Weights   08/05/23 1419 08/06/23 0224  Weight: 74.2 kg 78.3 kg    Examination:   General: Appearance:     Overweight female who appears weak     Lungs:    respirations unlabored  Heart:    Bradycardic. Normal rhythm. No murmurs, rubs, or gallops.    MS:   All extremities are intact.    Neurologic:   Awake, alert  Data Reviewed: I have personally reviewed following labs and imaging studies  CBC: Recent Labs  Lab 08/05/23 1526 08/06/23 0844 08/07/23 0823 08/08/23 0403 08/09/23 0500  WBC 6.5 4.2 4.7 5.1 4.2  NEUTROABS 5.1  --   --   --   --   HGB 12.3 11.5* 11.1* 12.1 10.1*  HCT 37.9 34.6* 34.3* 38.3 31.7*  MCV 100.5* 98.9 100.0 101.1* 100.0  PLT 223 186 182 197 166   Basic Metabolic Panel: Recent Labs  Lab 08/05/23 1526 08/06/23 0354 08/06/23 0844 08/07/23 0823 08/08/23 0403 08/09/23 0500  NA 140 136  --   138 135 139  K 4.3 3.8  --  3.8 3.4* 3.8  CL 101 104  --  105 101 107  CO2 27 25  --  26 25 24   GLUCOSE 112* 125*  --  98 98 94  BUN 15 11  --  11 10 7*  CREATININE 0.61 0.51  --  0.60 0.66 0.52  CALCIUM 9.1 8.6*  --  8.0* 8.4* 8.1*  MG  --   --  1.9  --   --   --   PHOS  --   --  5.0*  --   --   --    GFR: Estimated Creatinine Clearance: 78.6 mL/min (by C-G formula based on SCr of 0.52 mg/dL). Liver Function Tests: Recent Labs  Lab 08/05/23 1526 08/06/23 0354 08/07/23 0823 08/08/23 0403  AST 915* 1,085* 266* 154*  ALT 269* 611* 301* 254*  ALKPHOS 211* 295* 214* 217*  BILITOT 0.8 0.9 1.0 1.0  PROT 5.8* 5.5* 5.1* 6.0*  ALBUMIN 3.2* 3.0* 2.7* 3.4*   Recent Labs  Lab 08/05/23 1526  LIPASE 100*   No results for input(s): AMMONIA  in the last 168 hours. Coagulation Profile: Recent Labs  Lab 08/05/23 1526 08/07/23 0823 08/09/23 0500  INR 1.0 1.1 1.1   Cardiac Enzymes: No results for input(s): CKTOTAL, CKMB, CKMBINDEX, TROPONINI in the last 168 hours. BNP (last 3 results) No results for input(s): PROBNP in the last 8760 hours. HbA1C: No results for input(s): HGBA1C in the last 72 hours. CBG: No results for input(s): GLUCAP in the last 168 hours. Lipid Profile: No results for input(s): CHOL, HDL, LDLCALC, TRIG, CHOLHDL, LDLDIRECT in the last 72 hours. Thyroid Function Tests: Recent Labs    08/08/23 0403  TSH 3.190   Anemia Panel: Recent Labs    08/06/23 1538  FERRITIN 23  TIBC 352  IRON 294*   Sepsis Labs: No results for input(s): PROCALCITON, LATICACIDVEN in the last 168 hours.  No results found for this or any previous visit (from the past 240 hours).       Radiology Studies: US  LIVER DOPPLER Result Date: 08/08/2023 CLINICAL DATA:  212411 Elevated liver enzymes 212411. EXAM: DUPLEX ULTRASOUND OF LIVER TECHNIQUE: Color and duplex Doppler ultrasound was performed to evaluate the hepatic in-flow and out-flow vessels.  COMPARISON:  MRI abdomen from 08/05/2023. FINDINGS: Liver: There is increased hepatic echogenicity which reduces the sensitivity of ultrasound for the detection of focal masses. That being said, no focal mass is identified. Normal hepatic contour without nodularity. No intrahepatic biliary ductal dilatation. Main Portal Vein size: 1.25 cm Portal Vein Velocities Main Prox:  34.3 cm/sec Main Mid: 38.1 cm/sec Main Dist:  27.5 cm/sec Right: 36.1 cm/sec Left: 25.1 cm/sec Hepatic Vein Velocities Right:  38.5 cm/sec Middle:  47.1 cm/sec Left:  50.0 cm/sec IVC: Present and patent with normal respiratory phasicity. Hepatic Artery Velocity:  61.0  cm/sec Splenic Vein Velocity:  25.6 cm/sec Spleen: 3.2 cm x 10.2 cm x 11.2 cm with a total volume of 192 cm^3 (411 cm^3 is upper limit normal) Portal Vein Occlusion/Thrombus: No Splenic Vein Occlusion/Thrombus: No Ascites: None Varices: None IMPRESSION: 1. Increased hepatic echogenicity which is nonspecific but most likely secondary to hepatic steatosis. 2. Unremarkable liver Doppler exam. Electronically Signed   By: Ree Molt M.D.   On: 08/08/2023 10:17   ECHOCARDIOGRAM COMPLETE Result Date: 08/08/2023    ECHOCARDIOGRAM REPORT   Patient Name:   DESHARA ROSSI Date of Exam: 08/07/2023 Medical Rec #:  [id number]        Height:       67.0 in Accession #:    [id number]       Weight:       172.6 lb Date of Birth:  [birth year]         BSA:          1.900 m Patient Age:    63 years         BP:           103/49 mmHg Patient Gender: F                HR:           52 bpm. Exam Location:  [facility] Procedure: 2D Echo (Both Spectral and Color Flow Doppler were utilized during            procedure). Indications:    hypotension  History:        Patient has no prior history of Echocardiogram examinations.  Sonographer:    Tinnie Barefoot RDCS Referring Phys: [phone] Perez Dirico U Santana Edell IMPRESSIONS  1. Left ventricular ejection fraction, by estimation, is 55 to 60%. The left ventricle has normal  function. The left ventricle has no regional wall motion abnormalities. Left ventricular diastolic parameters were normal.  2. Right ventricular systolic function is normal. The right ventricular size is normal. There is mildly elevated pulmonary artery systolic pressure. The estimated right ventricular systolic pressure is 36.9 mmHg.  3. The mitral valve is normal in structure. Trivial mitral valve regurgitation. No evidence of mitral stenosis.  4. The aortic valve is grossly normal. There is mild calcification of the aortic valve. Aortic valve regurgitation is not visualized. No aortic stenosis is present.  5. The inferior vena cava is dilated in size with <50% respiratory variability, suggesting right atrial pressure of 15 mmHg. FINDINGS  Left Ventricle: Left ventricular ejection fraction, by estimation, is 55 to 60%. The left ventricle has normal function. The left ventricle has no regional wall motion abnormalities. The left ventricular internal cavity size was normal in size. There is  no left ventricular hypertrophy. Left ventricular diastolic parameters were normal. Right Ventricle: The right ventricular size is normal. No increase in right ventricular wall thickness. Right ventricular systolic function is normal. There is mildly elevated pulmonary artery systolic pressure. The tricuspid regurgitant velocity is 2.34  m/s, and with an assumed right atrial pressure of 15 mmHg, the estimated right ventricular systolic pressure is 36.9 mmHg. Left Atrium: Left atrial size was normal in size. Right Atrium: Right atrial size was normal in size. Pericardium: There is no evidence of pericardial effusion. Mitral Valve: The mitral valve is normal in structure. Trivial mitral valve regurgitation. No evidence of mitral valve stenosis. Tricuspid Valve: The tricuspid valve is normal in structure. Tricuspid valve regurgitation is mild . No evidence of tricuspid stenosis. Aortic Valve: The aortic  valve is grossly normal.  There is mild calcification of the aortic valve. Aortic valve regurgitation is not visualized. No aortic stenosis is present. Aortic valve mean gradient measures 5.6 mmHg. Aortic valve peak gradient measures 12.0 mmHg. Pulmonic Valve: The pulmonic valve was not well visualized. Pulmonic valve regurgitation is trivial. No evidence of pulmonic stenosis. Aorta: The aortic root is normal in size and structure. Venous: The inferior vena cava is dilated in size with less than 50% respiratory variability, suggesting right atrial pressure of 15 mmHg. IAS/Shunts: No atrial level shunt detected by color flow Doppler.  LEFT VENTRICLE PLAX 2D LVIDd:         4.00 cm Diastology LVIDs:         2.90 cm LV e' medial:    11.90 cm/s LV PW:         0.90 cm LV E/e' medial:  6.7 LV IVS:        0.80 cm LV e' lateral:   15.90 cm/s                        LV E/e' lateral: 5.0  RIGHT VENTRICLE             IVC RV Basal diam:  3.00 cm     IVC diam: 2.60 cm RV S prime:     11.20 cm/s TAPSE (M-mode): 2.2 cm LEFT ATRIUM             Index        RIGHT ATRIUM           Index LA diam:        3.70 cm 1.95 cm/m   RA Area:     15.10 cm LA Vol (A2C):   52.4 ml 27.58 ml/m  RA Volume:   34.90 ml  18.37 ml/m LA Vol (A4C):   46.5 ml 24.48 ml/m LA Biplane Vol: 49.3 ml 25.95 ml/m  AORTIC VALVE AV Vmax:           173.25 cm/s AV Vmean:          111.414 cm/s AV VTI:            0.376 m AV Peak Grad:      12.0 mmHg AV Mean Grad:      5.6 mmHg LVOT Vmax:         147.00 cm/s LVOT Vmean:        85.800 cm/s LVOT VTI:          0.328 m LVOT/AV VTI ratio: 0.87  AORTA Ao Root diam: 2.60 cm Ao Asc diam:  2.80 cm MITRAL VALVE               TRICUSPID VALVE MV Area (PHT): 3.77 cm    TR Peak grad:   21.9 mmHg MV Decel Time: 201 msec    TR Vmax:        234.00 cm/s MV E velocity: 79.50 cm/s MV A velocity: 52.50 cm/s  SHUNTS MV E/A ratio:  1.51        Systemic VTI: 0.33 m Soyla Merck MD Electronically signed by Soyla Merck MD Signature Date/Time: 08/08/2023/8:13:02  AM    Final         Scheduled Meds:  benztropine   0.5 mg Oral Daily   diclofenac  Sodium  2 g Topical QID   feeding supplement  1 Container Oral TID BM   lidocaine   1 patch Transdermal Q24H   pantoprazole  (PROTONIX ) IV  40  mg Intravenous Q12H   sucralfate   1 g Oral TID WC & HS   Continuous Infusions:  sodium chloride  75 mL/hr at 08/09/23 0525     LOS: 3 days    Time spent: 45 minutes spent on chart review, discussion with nursing staff, consultants, updating family and interview/physical exam; more than 50% of that time was spent in counseling and/or coordination of care.    Harlene RAYMOND Bowl, DO [facility] Available via Epic secure chat 7am-7pm After these hours, please refer to coverage provider listed on [email or web address] 08/09/2023, 10:28 AM

## 2023-08-09 NOTE — Anesthesia Procedure Notes (Addendum)
 Date/Time: 08/09/2023 2:00 PM  Performed by: Barbarann Verneita RAMAN, CRNAPre-anesthesia Checklist: Patient identified, Emergency Drugs available, Suction available, Timeout performed and Patient being monitored Patient Re-evaluated:Patient Re-evaluated prior to induction Oxygen Delivery Method: Non-rebreather mask

## 2023-08-09 NOTE — Brief Op Note (Addendum)
 08/05/2023 - 08/09/2023  2:17 PM  PATIENT:  Vicki Thomas  63 y.o. female  PRE-OPERATIVE DIAGNOSIS:  epigastric pain, hematochezia  POST-OPERATIVE DIAGNOSIS:  EGD: gastric bypass ersions at anastomosis COLON:  PROCEDURE:  Procedure(s): EGD (ESOPHAGOGASTRODUODENOSCOPY) (N/A) COLONOSCOPY (N/A)  SURGEON:  Surgeons and Role:    * Eartha Angelia Sieving, MD - Primary  Patient underwent EGD AND COLONOSCOPY under propofol  sedation.  Tolerated the procedure adequately.   EGD FINDINGS: - Normal esophagus.  - Gastric bypass with a pouch 4 cm in length and intact staple line.  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  - Normal examined jejunum.   COLONOSCOPY FINDINGS: - Hemorrhoids found on perianal exam.  - Eight 2 to 5 mm polyps in the transverse colon, removed with a cold snare.  Resected and retrieved.  - One 3 mm polyp in the rectum, removed with a cold snare.  Resected and retrieved.  - Diverticulosis in the sigmoid colon, in the descending colon and in the ascending colon.   Rectal bleeding source is related to hemorrhoids.  RECOMMENDATIONS - Return patient to hospital ward for ongoing care.  - Resume previous diet.  - Return patient to hospital ward for ongoing care.  - No ibuprofen , naproxen , or other non-steroidal anti-inflammatory drugs.  - Use Protonix  (pantoprazole ) 40 mg PO daily.  - Repeat colonoscopy for surveillance based on pathology results.  - Anusol  suppositories every 12 hours for 10 days. - Patient will follow up in GI clinic  in 3-4 weeks. - GI service will sign-off, please call us  back if you have any more questions.  Sieving Eartha, MD Gastroenterology and Hepatology [facility]

## 2023-08-09 NOTE — Anesthesia Postprocedure Evaluation (Signed)
 Anesthesia Post Note  Patient: Vicki Thomas  Procedure(s) Performed: EGD (ESOPHAGOGASTRODUODENOSCOPY) COLONOSCOPY  Patient location during evaluation: Phase II Anesthesia Type: General Level of consciousness: awake and alert Pain management: pain level controlled Vital Signs Assessment: post-procedure vital signs reviewed and stable Respiratory status: spontaneous breathing, nonlabored ventilation and respiratory function stable Cardiovascular status: blood pressure returned to baseline and stable Postop Assessment: no apparent nausea or vomiting Anesthetic complications: no   There were no known notable events for this encounter.   Last Vitals:  Vitals:   08/09/23 1515 08/09/23 1530  BP: 117/72 (!) 145/70  Pulse: (!) 54 (!) 44  Resp: 15 13  Temp:  36.4 C  SpO2: 94% 99%    Last Pain:  Vitals:   08/09/23 1530  TempSrc:   PainSc: 0-No pain                 Lanijah Warzecha L Cavon Nicolls

## 2023-08-09 NOTE — Op Note (Signed)
 [facility] Patient Name: Vicki Thomas Procedure Date: 08/09/2023 1:33 PM MRN: [id number] Date of Birth: 07-24-1960 Attending MD: Toribio Fortune , , [id number] CSN: [id number] Age: 63 Admit Type: Outpatient Procedure:                Upper GI endoscopy Indications:              Epigastric abdominal pain Providers:                Toribio Fortune, Madelin Hunter, RN, [place] Wilson,                            Technician Referring MD:              Medicines:                Monitored Anesthesia Care Complications:            No immediate complications. Estimated Blood Loss:     Estimated blood loss: none. Procedure:                Pre-Anesthesia Assessment:                           - Prior to the procedure, a History and Physical                            was performed, and patient medications, allergies                            and sensitivities were reviewed. The patient's                            tolerance of previous anesthesia was reviewed.                           - The risks and benefits of the procedure and the                            sedation options and risks were discussed with the                            patient. All questions were answered and informed                            consent was obtained.                           - ASA Grade Assessment: III - A patient with severe                            systemic disease.                           After obtaining informed consent, the endoscope was                            passed under direct vision. Throughout the  procedure, the patient's blood pressure, pulse, and                            oxygen saturations were monitored continuously. The                            [id number]-H190 ([id number]) scope was introduced through the                            mouth, and advanced to the jejunum. The upper GI                            endoscopy was accomplished without difficulty. The                             patient tolerated the procedure well. Scope In: 2:08:39 PM Scope Out: 2:12:10 PM Total Procedure Duration: 0 hours 3 minutes 31 seconds  Findings:      The examined esophagus was normal.      Evidence of a gastric bypass was found. A gastric pouch with a 4 cm       length from the GE junction to the gastrojejunal anastomosis was found.       The staple line appeared intact. The gastrojejunal anastomosis was       characterized by erosion. This was traversed.      The examined jejunum was normal (afferent and efferent limbs). Impression:               - Normal esophagus.                           - Gastric bypass with a pouch 4 cm in length and                            intact staple line. Gastrojejunal anastomosis                            characterized by erosion.                           - Normal examined jejunum.                           - No specimens collected. Moderate Sedation:      Per Anesthesia Care Recommendation:           - Return patient to hospital ward for ongoing care.                           - Resume previous diet.                           - Return patient to hospital ward for ongoing care.                           - No ibuprofen , naproxen , or other non-steroidal  anti-inflammatory drugs.                           - Use Protonix  (pantoprazole ) 40 mg PO daily. 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 All rights reserved. The codes documented in this report are preliminary and upon coder review may  be revised to meet current compliance requirements. Toribio Fortune, MD Toribio Fortune,  08/09/2023 2:20:18 PM This report has been signed electronically. Number of Addenda: 0

## 2023-08-09 NOTE — Anesthesia Preprocedure Evaluation (Addendum)
 Anesthesia Evaluation  Patient identified by MRN, date of birth, ID band Patient awake    Reviewed: Allergy & Precautions, H&P , NPO status , Patient's Chart, lab work & pertinent test results, reviewed documented beta blocker date and time   Airway Mallampati: II  TM Distance: >3 FB Neck ROM: full    Dental no notable dental hx. (+) Dental Advisory Given, Teeth Intact   Pulmonary former smoker   Pulmonary exam normal breath sounds clear to auscultation       Cardiovascular Exercise Tolerance: Good negative cardio ROS Normal cardiovascular exam Rhythm:regular Rate:Normal     Neuro/Psych  Headaches PSYCHIATRIC DISORDERS Anxiety  Bipolar Disorder    Neuromuscular disease    GI/Hepatic Neg liver ROS, hiatal hernia,,,  Endo/Other  negative endocrine ROS    Renal/GU negative Renal ROS  negative genitourinary   Musculoskeletal  (+) Arthritis , Osteoarthritis,  Fibromyalgia -  Abdominal   Peds  Hematology negative hematology ROS (+)   Anesthesia Other Findings   Reproductive/Obstetrics negative OB ROS                              Anesthesia Physical Anesthesia Plan  ASA: 2  Anesthesia Plan: General   Post-op Pain Management: Minimal or no pain anticipated   Induction:   PONV Risk Score and Plan: Propofol  infusion  Airway Management Planned: Natural Airway and Nasal Cannula  Additional Equipment: None  Intra-op Plan:   Post-operative Plan:   Informed Consent: I have reviewed the patients History and Physical, chart, labs and discussed the procedure including the risks, benefits and alternatives for the proposed anesthesia with the patient or authorized representative who has indicated his/her understanding and acceptance.     Dental Advisory Given  Plan Discussed with: CRNA  Anesthesia Plan Comments:          Anesthesia Quick Evaluation

## 2023-08-09 NOTE — Transfer of Care (Signed)
 Immediate Anesthesia Transfer of Care Note  Patient: Vicki Thomas  Procedure(s) Performed: EGD (ESOPHAGOGASTRODUODENOSCOPY) COLONOSCOPY  Patient Location: PACU  Anesthesia Type:General  Level of Consciousness: awake and alert   Airway & Oxygen Therapy: Patient Spontanous Breathing  Post-op Assessment: Report given to RN and Post -op Vital signs reviewed and stable  Post vital signs: Reviewed and stable  Last Vitals:  Vitals Value Taken Time  BP 124/64 08/09/23 15:11  Temp 97.6 08/09/23  1514  Pulse 45 08/09/23 15:09  Resp 18 08/09/23 15:12  SpO2 99 % 08/09/23 15:09  Vitals shown include unfiled device data.  Last Pain:  Vitals:   08/09/23 1401  TempSrc:   PainSc: 0-No pain      Patients Stated Pain Goal: 2 (08/08/23 1051)  Complications: No notable events documented.

## 2023-08-10 DIAGNOSIS — I493 Ventricular premature depolarization: Secondary | ICD-10-CM | POA: Diagnosis not present

## 2023-08-10 DIAGNOSIS — R001 Bradycardia, unspecified: Secondary | ICD-10-CM | POA: Diagnosis not present

## 2023-08-10 DIAGNOSIS — R079 Chest pain, unspecified: Secondary | ICD-10-CM | POA: Diagnosis not present

## 2023-08-10 DIAGNOSIS — R0789 Other chest pain: Secondary | ICD-10-CM

## 2023-08-10 DIAGNOSIS — R7401 Elevation of levels of liver transaminase levels: Secondary | ICD-10-CM | POA: Diagnosis not present

## 2023-08-10 DIAGNOSIS — R718 Other abnormality of red blood cells: Secondary | ICD-10-CM | POA: Diagnosis not present

## 2023-08-10 DIAGNOSIS — I959 Hypotension, unspecified: Secondary | ICD-10-CM | POA: Diagnosis not present

## 2023-08-10 DIAGNOSIS — R1013 Epigastric pain: Secondary | ICD-10-CM | POA: Diagnosis not present

## 2023-08-10 LAB — CBC
HCT: 32 % — ABNORMAL LOW (ref 36.0–46.0)
Hemoglobin: 10.6 g/dL — ABNORMAL LOW (ref 12.0–15.0)
MCH: 33 pg (ref 26.0–34.0)
MCHC: 33.1 g/dL (ref 30.0–36.0)
MCV: 99.7 fL (ref 80.0–100.0)
Platelets: 169 K/uL (ref 150–400)
RBC: 3.21 MIL/uL — ABNORMAL LOW (ref 3.87–5.11)
RDW: 11.9 % (ref 11.5–15.5)
WBC: 6 K/uL (ref 4.0–10.5)
nRBC: 0 % (ref 0.0–0.2)

## 2023-08-10 LAB — COMPREHENSIVE METABOLIC PANEL WITH GFR
ALT: 101 U/L — ABNORMAL HIGH (ref 0–44)
AST: 34 U/L (ref 15–41)
Albumin: 2.7 g/dL — ABNORMAL LOW (ref 3.5–5.0)
Alkaline Phosphatase: 152 U/L — ABNORMAL HIGH (ref 38–126)
Anion gap: 6 (ref 5–15)
BUN: 8 mg/dL (ref 8–23)
CO2: 25 mmol/L (ref 22–32)
Calcium: 8.1 mg/dL — ABNORMAL LOW (ref 8.9–10.3)
Chloride: 107 mmol/L (ref 98–111)
Creatinine, Ser: 0.63 mg/dL (ref 0.44–1.00)
GFR, Estimated: >60 mL/min (ref >60)
Glucose, Bld: 117 mg/dL — ABNORMAL HIGH (ref 70–99)
Potassium: 3.7 mmol/L (ref 3.5–5.1)
Sodium: 138 mmol/L (ref 135–145)
Total Bilirubin: 0.8 mg/dL (ref 0.0–1.2)
Total Protein: 5 g/dL — ABNORMAL LOW (ref 6.5–8.1)

## 2023-08-10 LAB — TROPONIN I (HIGH SENSITIVITY)
Troponin I (High Sensitivity): 2 ng/L (ref <18)
Troponin I (High Sensitivity): 2 ng/L (ref <18)

## 2023-08-10 MED ORDER — FAMOTIDINE 20 MG PO TABS
20.0000 mg | ORAL_TABLET | Freq: Every day | ORAL | Status: DC
  Administered 2023-08-10 – 2023-08-12 (×3): 20 mg via ORAL
  Filled 2023-08-10 (×3): qty 1

## 2023-08-10 NOTE — Progress Notes (Addendum)
 Progress Note  RN called due to patient complaining of chest pain.  At bedside, she states that it was a chronic issue but does not ongoing for about 1 to 2 years whereby irrespective of activity being done (resting, walking etc), and the chest pain which was usually dull/burning sensation and associated with feeling clammy and with numbness in both upper extremities.  Symptoms usually resolve on its own. Of note, patient has reproducible component of the chest discomfort. EKG was done and it showed sinus bradycardia at a rate of 51 bpm with PVCs.  Reviewing other vital signs while in the hospital, she appears to have been mostly bradycardic with HR as low as 44 bpm. Echocardiogram done on 08/07/2023 showed LVEF of 30 to 60%.  No RWMA.  LV diastolic parameters were normal.  Assessment and plan Atypical chest pain Sinus bradycardia with occasional symptoms Continue telemetry  Troponin pending EKG personally reviewed showed sinus bradycardia at a rate of 51 bpm with PVCs. Cardiology will be consulted to help decide if Stress test is needed in am Versus other  diagnostic modalities.      Total time:  33 minutes This includes time reviewing the chart including progress notes, labs, EKGs, taking medical decisions, ordering labs and documenting findings.  Please refer to admission H&P, progress notes, consult notes for details regarding the care of this patient

## 2023-08-10 NOTE — Progress Notes (Signed)
 Nurse at bedside,patient c/o bilateral shoulder pain radiating to her back,rated a 8, a achy discomfort that's off and on,that included pressure to her chest .However,patient also c/o indigestion too this am.Blood pressure was 105/52,heart rate 47.Patient was also ,experiencing some nausea as well.Dr Vicci notified. Plan of care on going.

## 2023-08-10 NOTE — Hospital Course (Signed)
 63 y.o. female with medical history significant of anxiety who presents to the emergency department due to epigastric pain which started yesterday.  Pain was intermittent and was of moderate/severe in intensity.  This was associated with nausea and belching without actually vomiting.  EMS was activated on arrival of EMS team, she was given fentanyl 75 mcg and Zofran  4 mg with some improvement the pain.  She states that other times when she had similar pain, the pain usually resolves after drinking some milk, but this time, the pain worsens.  She states that she usually take 1 to 2 tablets of Motrin  once or twice daily for several years.  She denies alcohol, tobacco or any occasional drug use.  Patient endorsed prior history of cholecystectomy and gastric sleeve surgery.  Patient is getting EGD/colonoscopy, but this has been delayed due to poor bowel prep on 7/14.  Hospital stay has also been complicated by hypotension but this may be patient's baseline and chronic-workup in process.

## 2023-08-10 NOTE — Plan of Care (Signed)

## 2023-08-10 NOTE — Consult Note (Addendum)
 Cardiology Consultation   Patient ID: Vicki Thomas MRN: [id number]; DOB: Jun 11, 1960  Admit date: 08/05/2023 Date of Consult: 08/10/2023  PCP:  Bevely Doffing, FNP   [facility] Providers Cardiologist:  None   - NEW   Patient Profile: Vicki Thomas is a 63 y.o. female with a hx of anxiety, schizophrenia who is being seen 08/10/2023 for the evaluation of chest pain at the request of Dr. Vicci.  History of Present Illness: Vicki Thomas has not been seen by heart care or any other cardiologists per care everywhere review.  Presented to the [facility] ED on 7/12 for epigastric pain.  GI was consulted due to transaminitis. Tn negative 4, K 4.3 now 3.7, CR 0.61 now 0.63, Mg 1.9, AST 915, ALT 269, albumin 3.2, LDH 215, Hgb 12.3 now 10.6, TSH WNL, negative EBV/HSV/varicella/hepatitis A, B, C, UDS positive for opiates.  EKG showed NSR, HR 64, nonspecific ST changes, baseline artifact (with a previous EKG 06/2023) Echo 08/07/2023 showed EF 55 to 60%, mildly elevated PASP, trivial MVR, mild calcification of aortic valve w/o AS EGD showed normal esophagus and jejunum with evidence of gastric bypass intact and gastrojejunal anastomosis.  Colonoscopy showed nonbleeding external and internal hemorrhoids as well as multiple polyps which were removed.  This a.m. RN called hospitalist due to compression pain of chest pain. EKG showed normal sinus bradycardia, HR 51 with PVC, unchanged nonspecific ST changes.  TN 2, repeat pending.  Cardiology was consulted  On interview, reports intermittent chest pain for 1 to 2 years that has been increasing in frequency lately and has not previously been evaluated.  Describes CP as dull/achy/heaviness, 8 out of 10, located across chest, lasting 10 to 30 minutes, occurring mainly at night waking her up, radiates in both arms.  Associated with paresthesia in both arms, diaphoresis, lightheadedness, presyncope, SOB, palpitations, headache in temporal region.   Sometimes relieved with Motrin  and milk.  Denies any exertional chest pain.  Reports history of GERD and taking OTC antiacids.  Reports sometimes eating late before going to bed but tries to avoid.  She notes monitoring her BP at home to correlate symptoms with SBP usually 90-100's. Reports adequate daily hydration, approximately 5 to 6 glasses of water. Reports chronic unchanged edema X 30 years that is not relieved with leg elevation and cannot tolerate compression socks.  Quit smoking about 10 years ago after 25-year pack history.  Denies EtOH or drug use.  She has been homebound for the past several years.  She is able to get around using rolling walker. Family cardiac history significant for A-fib (father), HLD (mother/aunt),  and aunt/uncle (MI, pacemaker).    Past Medical History:  Diagnosis Date   Anxiety    Arthritis    Bipolar 1 disorder (HCC)    Chronic back pain    Chronic fatigue    Chronic headaches    Chronic pain    Fibromyalgia    Hiatal hernia    History of borderline personality disorder    Interstitial cystitis    Lymphedema    Panic disorder with agoraphobia    PTSD (post-traumatic stress disorder)     Past Surgical History:  Procedure Laterality Date   ABDOMINAL HYSTERECTOMY     APPENDECTOMY     BARIATRIC SURGERY     CHOLECYSTECTOMY     ESOPHAGUS SURGERY     FOOT SURGERY     HEMORRHOID SURGERY     HERNIA REPAIR     TONSILLECTOMY  Home Medications:  Prior to Admission medications   Medication Sig Start Date End Date Taking? Authorizing Provider  aspirin-sod bicarb-citric acid (ALKA-SELTZER) 325 MG TBEF tablet Take 325 mg by mouth every 6 (six) hours as needed (heartburn, indigestion).   Yes [provider]  benztropine  (COGENTIN ) 0.5 MG tablet Take 0.5 mg by mouth daily. 07/13/23  Yes [provider]  bisacodyl  (DULCOLAX) 5 MG EC tablet Take 5 mg by mouth daily as needed for moderate constipation.   Yes [provider]   busPIRone  (BUSPAR ) 7.5 MG tablet Take 1 tablet (7.5 mg total) by mouth 3 (three) times daily. Patient taking differently: Take 3.75 mg by mouth 3 (three) times daily as needed (anxiety). 07/04/23  Yes Bevely Doffing, FNP  calcium carbonate (TUMS EX) 750 MG chewable tablet Chew 1 tablet by mouth daily as needed for heartburn.   Yes [provider]  clotrimazole  (GYNE-LOTRIMIN ) 1 % vaginal cream Place 1 Applicatorful vaginally at bedtime. 05/17/23  Yes Jadapalle, Sree, MD  ibuprofen  (ADVIL ) 200 MG tablet Take 400 mg by mouth every 6 (six) hours as needed for moderate pain (pain score 4-6).   Yes [provider]  ondansetron  (ZOFRAN -ODT) 8 MG disintegrating tablet Take 1 tablet (8 mg total) by mouth every 8 (eight) hours as needed for nausea or vomiting. 06/16/23  Yes Kennedy Charmaine CROME, NP  polyethylene glycol (MIRALAX  / GLYCOLAX ) 17 g packet Take 17 g by mouth daily as needed for mild constipation.   Yes [provider]  potassium chloride  (K-DUR) 10 MEQ tablet Take 1 tablet (10 mEq total) by mouth daily. Patient taking differently: Take 10 mEq by mouth daily as needed (pot drops, muscle spasms). 04/02/15  Yes Horton, Charmaine FALCON, MD  promethazine  (PHENERGAN ) 12.5 MG tablet Take 12.5 mg by mouth 2 (two) times daily as needed for vomiting or nausea. 06/21/23  Yes [provider]  simethicone  (MYLICON) 125 MG chewable tablet Chew 125 mg by mouth every 6 (six) hours as needed for flatulence.   Yes [provider]  traZODone  (DESYREL ) 50 MG tablet Take 1-2 tablets (50-100 mg total) by mouth at bedtime as needed for sleep. Patient taking differently: Take 50 mg by mouth at bedtime as needed for sleep. 06/08/23  Yes Bevely Doffing, FNP  UZEDY  50 MG/0.14ML SUSY Inject 50 mg into the skin every 14 (fourteen) days. 07/26/23  Yes [provider]    Scheduled Meds:  benztropine   0.5 mg Oral Daily   diclofenac  Sodium  2 g Topical QID   famotidine   20 mg Oral Daily    feeding supplement  1 Container Oral TID BM   hydrocortisone   25 mg Rectal BID   lidocaine   1 patch Transdermal Q24H   pantoprazole   40 mg Oral Daily   sucralfate   1 g Oral TID WC & HS   Continuous Infusions:  PRN Meds: bisacodyl , busPIRone , ondansetron  (ZOFRAN ) IV, traMADol , traZODone , witch hazel-glycerin   Allergies:    Allergies  Allergen Reactions   Bee Venom Swelling    Requires Epipen   Brexpiprazole  Other (See Comments)    Severe head pain   Erythromycin Shortness Of Breath    Throat closes, flushing    Penicillins Shortness Of Breath    Throat closes, flushing   Abilify [Aripiprazole] Other (See Comments)    Unknown    Aripiprazole Other (See Comments)    Unknown   Ciprocin-Fluocin-Procin [Fluocinolone Acetonide] Other (See Comments)    Unknown   Fexofenadine Hcl Other (See Comments)  Unknown    Fluoxetine Hcl Other (See Comments)    Aggression   Gabapentin Other (See Comments)    Dizziness and excessive falling   Haloperidol Decanoate Other (See Comments)    Facial mask symptoms   Iodine Other (See Comments)    Unknown    Lamotrigine Other (See Comments)    Vertigo Increased falls   Lithium  Other (See Comments)    Unknown    Lurasidone Hcl Other (See Comments)    Feels like her head is going to explode Dizziness   Marcaine [Bupivacaine Hcl] Other (See Comments)    Unknown    Mirtazapine Other (See Comments)    Dizziness, Blurred vision   Paroxetine Hcl Other (See Comments)    Unknown    Pregabalin Other (See Comments)    Unknown    Quetiapine Fumarate Swelling   Seroquel [Quetiapine Fumarate] Other (See Comments)    Unknown    Tape Other (See Comments)    Unknown    Zonisamide Other (See Comments)    Unknown     Social History:   Social History   Socioeconomic History   Marital status: Single    Spouse name: Not on file   Number of children: Not on file   Years of education: Not on file   Highest education level: Not on file   Occupational History   Not on file  Tobacco Use   Smoking status: Former    Current packs/day: 1.00    Average packs/day: 1 pack/day for 6.0 years (6.0 ttl pk-yrs)    Types: Cigarettes   Smokeless tobacco: Never  Vaping Use   Vaping status: Never Used  Substance and Sexual Activity   Alcohol use: No   Drug use: Yes    Types: Marijuana   Sexual activity: Not Currently  Other Topics Concern   Not on file  Social History Narrative   Not on file   Family History:   Family History  Problem Relation Age of Onset   Alcohol abuse Mother    Colon polyps Mother    Bipolar disorder Father    Paranoid behavior Father    Alcohol abuse Father    Bipolar disorder Sister    Anxiety disorder Sister    Alcohol abuse Brother    Drug abuse Brother    Schizophrenia Maternal Grandfather    Anxiety disorder Maternal Aunt    Bipolar disorder Paternal Aunt    Colon polyps Sibling      ROS:  Please see the history of present illness.  All other ROS reviewed and negative.     Physical Exam/Data: Vitals:   08/10/23 0501 08/10/23 0540 08/10/23 0549 08/10/23 0733  BP: (!) 92/44 (!) 97/44 (!) 104/55 (!) 105/52  Pulse:  (!) 47 (!) 48 (!) 47  Resp: 16   17  Temp: 98.6 F (37 C)   97.7 F (36.5 C)  TempSrc: Oral   Oral  SpO2: 97% 98%  95%  Weight:      Height:        Intake/Output Summary (Last 24 hours) at 08/10/2023 0831 Last data filed at 08/10/2023 0535 Gross per 24 hour  Intake 990 ml  Output --  Net 990 ml      08/06/2023    2:24 AM 08/05/2023    2:19 PM 06/16/2023    8:13 AM  Last 3 Weights  Weight (lbs) 172 lb 9.9 oz 163 lb 9.3 oz 163 lb 9.6 oz  Weight (kg) 78.3 kg  74.2 kg 74.208 kg     Body mass index is 27.04 kg/m.  General:  Well nourished, well developed, in no acute distress HEENT: normal Neck: no JVD Vascular: No carotid bruits; Distal pulses 2+ bilaterally Cardiac:  normal S1, S2; RRR; no murmur  Lungs:  clear to auscultation bilaterally, no wheezing, rhonchi  or rales  Abd: soft, nontender, no hepatomegaly  Ext: no edema Musculoskeletal:  No deformities, BUE and BLE strength normal and equal Skin: warm and dry  Neuro:  CNs 2-12 intact, no focal abnormalities noted Psych:  Normal affect   EKG:  The EKG was personally reviewed and demonstrates:  NSR, HR 64, nonspecific ST changes, baseline artifact (with a previous EKG 06/2023) Repeat EKG showed normal sinus bradycardia, HR 51 with PVC, unchanged nonspecific ST changes.  Telemetry:  Telemetry was personally reviewed and demonstrates:  Ordered.   Relevant CV Studies: ECHO 08/07/2023 IMPRESSIONS   1. Left ventricular ejection fraction, by estimation, is 55 to 60%. The  left ventricle has normal function. The left ventricle has no regional  wall motion abnormalities. Left ventricular diastolic parameters were  normal.   2. Right ventricular systolic function is normal. The right ventricular  size is normal. There is mildly elevated pulmonary artery systolic  pressure. The estimated right ventricular systolic pressure is 36.9 mmHg.   3. The mitral valve is normal in structure. Trivial mitral valve  regurgitation. No evidence of mitral stenosis.   4. The aortic valve is grossly normal. There is mild calcification of the  aortic valve. Aortic valve regurgitation is not visualized. No aortic  stenosis is present.   5. The inferior vena cava is dilated in size with <50% respiratory  variability, suggesting right atrial pressure of 15 mmHg.   Laboratory Data: High Sensitivity Troponin:   Recent Labs  Lab 08/05/23 1538 08/10/23 0510  TROPONINIHS 4 2     Chemistry Recent Labs  Lab 08/06/23 0844 08/07/23 0823 08/08/23 0403 08/09/23 0500 08/10/23 0510  NA  --    < > 135 139 138  K  --    < > 3.4* 3.8 3.7  CL  --    < > 101 107 107  CO2  --    < > 25 24 25   GLUCOSE  --    < > 98 94 117*  BUN  --    < > 10 7* 8  CREATININE  --    < > 0.66 0.52 0.63  CALCIUM  --    < > 8.4* 8.1* 8.1*  MG  1.9  --   --   --   --   GFRNONAA  --    < > >60 >60 >60  ANIONGAP  --    < > 9 8 6    < > = values in this interval not displayed.    Recent Labs  Lab 08/07/23 0823 08/08/23 0403 08/10/23 0510  PROT 5.1* 6.0* 5.0*  ALBUMIN 2.7* 3.4* 2.7*  AST 266* 154* 34  ALT 301* 254* 101*  ALKPHOS 214* 217* 152*  BILITOT 1.0 1.0 0.8   Lipids No results for input(s): CHOL, TRIG, HDL, LABVLDL, LDLCALC, CHOLHDL in the last 168 hours.  Hematology Recent Labs  Lab 08/08/23 0403 08/09/23 0500 08/10/23 0510  WBC 5.1 4.2 6.0  RBC 3.79* 3.17* 3.21*  HGB 12.1 10.1* 10.6*  HCT 38.3 31.7* 32.0*  MCV 101.1* 100.0 99.7  MCH 31.9 31.9 33.0  MCHC 31.6 31.9 33.1  RDW 12.0 11.8 11.9  PLT 197 166 169   Thyroid  Recent Labs  Lab 08/08/23 0403  TSH 3.190    BNPNo results for input(s): BNP, PROBNP in the last 168 hours.  DDimer No results for input(s): DDIMER in the last 168 hours.  Radiology/Studies:  US  LIVER DOPPLER Result Date: 08/08/2023 CLINICAL DATA:  212411 Elevated liver enzymes 212411. EXAM: DUPLEX ULTRASOUND OF LIVER TECHNIQUE: Color and duplex Doppler ultrasound was performed to evaluate the hepatic in-flow and out-flow vessels. COMPARISON:  MRI abdomen from 08/05/2023. FINDINGS: Liver: There is increased hepatic echogenicity which reduces the sensitivity of ultrasound for the detection of focal masses. That being said, no focal mass is identified. Normal hepatic contour without nodularity. No intrahepatic biliary ductal dilatation. Main Portal Vein size: 1.25 cm Portal Vein Velocities Main Prox:  34.3 cm/sec Main Mid: 38.1 cm/sec Main Dist:  27.5 cm/sec Right: 36.1 cm/sec Left: 25.1 cm/sec Hepatic Vein Velocities Right:  38.5 cm/sec Middle:  47.1 cm/sec Left:  50.0 cm/sec IVC: Present and patent with normal respiratory phasicity. Hepatic Artery Velocity:  61.0 cm/sec Splenic Vein Velocity:  25.6 cm/sec Spleen: 3.2 cm x 10.2 cm x 11.2 cm with a total volume of 192 cm^3 (411  cm^3 is upper limit normal) Portal Vein Occlusion/Thrombus: No Splenic Vein Occlusion/Thrombus: No Ascites: None Varices: None IMPRESSION: 1. Increased hepatic echogenicity which is nonspecific but most likely secondary to hepatic steatosis. 2. Unremarkable liver Doppler exam. Electronically Signed   By: Ree Molt M.D.   On: 08/08/2023 10:17   Assessment and Plan: Atypical Chest pain Reports atypical chest pain features as above.  Less concern for ischemia with negative troponin, EKG without ischemic changes, normal echo, and atypical presentation.  However with history of tobacco use and family history, I think patient would benefit from further ischemic evaluation.   Would not consider for cath at this time.  Discussed exercise myoview  versus Lexiscan  vs coronary CTA.  Patient does not think she can walk on treadmill and would prefer Lexiscan .  Lexiscan  could be done either inpatient or outpatient.  However since limited transportation, would consider inpatient Lexiscan  for tomorrow.  Will discuss with MD  Hypotension This a.m. BP on soft side 105/52 Not on any oral BP medication.  Reports history of low BP for several months. Noted midodrine  was ordered but do not see in med history.  Continue IV fluids Ordered orthostatic vitals.   PVC  Palpitations  Reports palpitations in correlation to chest pain as above.  EKG showed normal sinus bradycardia, HR 51 with PVC, unchanged nonspecific ST changes.  Not telemetry. Order Telemetry.  May  need to consider outpatient monitor.   Transaminitis Not on any statin medications Managed by primary team and GI.   Risk Assessment/Risk Scores:      For questions or updates, please contact [facility] Please consult [email or web address] for contact info under    Signed, Lorette CINDERELLA Kapur, PA-C  08/10/2023 8:31 AM   Attending Note  Patient seen and discussed with PA Kapur, I agree with her documentation. 63 yo female history of  anxiety admitted with epigastric pain. During admission has had intermittent chest pains as well, severe episode this morning. Cardiology consulted for evalaution  Reports 1-2 years of intermittent chest pains. Band like haeaviness across chest into should blades, 9/10 in severity. Can have associated SOB, nausea, diaphoresis. Lasts roughly 10-30 minutes. Often occurs at night but can also occur during the day. Reports DOE with activities. Pain has been increasing in frequency, now occurring daily.  Admit labs K 4.3 Cr 0.61 BUN 15 AST 915 ALT 269 Alk phos 211 Trop 4-->2-->2 EKG SR, no acute ischemic changes Echo: LVE 55-60%, no WMAs, normal diastolic, normal RV function, dilated fixed IVC  1.Chest pain - unclear etiology, very mixed description of symptoms.  -EKG and enzymes have been benign, echo benign - reports some progressing DOE as well - would plan for lexiscan  tomorrow to further evaluation  2. Bradycardia - from notes rates 40s to 50s, at time of our evaluation she is not on telemetry and not able to review.  - EKGs 50s to 70s. Will place on telemetry.  - not on any av nodal agents, from review rare <1% on buspar  - dizziness at home but primarily orthostatic in description, she endorses poor oral hydration. Orthostatics are negative this AM. Has had some intermittent low bp's this admission.  - follow tele, possibly outpatient monitor.   3. Elevated LFTs - from notes thought secondary to transient hypotension   Dorn Ross MD

## 2023-08-10 NOTE — Progress Notes (Signed)
 RN went into room to further assess after tech reported BP was 92/44 (map 59). Pt stating she is experiencing chest discomfort bilateral that is worse with exhale. Charge RN updated. Patient continued to state she just doesn't feel right. MD Adefeso made aware at [phone]. Pt then stated she was having numbness and tingling to her arms. At 0530 MD updated again, stat EKG obtained. No orders or response from MD. At 0557 MD response was he will come upstairs.   MD at bedside 0630.     08/10/23 0501  Vitals  Temp 98.6 F (37 C)  Temp Source Oral  BP (!) 92/44  MAP (mmHg) (!) 59  BP Location Right Arm  BP Method Automatic  Patient Position (if appropriate) Lying  Resp 16  MEWS COLOR  MEWS Score Color Green  Oxygen Therapy  SpO2 97 %  O2 Device Room Air  MEWS Score  MEWS Temp 0  MEWS Systolic 1  MEWS Pulse 0  MEWS RR 0  MEWS LOC 0  MEWS Score 1

## 2023-08-10 NOTE — Progress Notes (Signed)
 PROGRESS NOTE   KESTREL MIS  FMW:[id number] DOB: 1960/07/27 DOA: 08/05/2023 PCP: Bevely Doffing, FNP   Chief Complaint  Patient presents with   Abdominal Cramping   Level of care: Med-Surg  Brief Admission History:  63 y.o. female with medical history significant of anxiety who presents to the emergency department due to epigastric pain which started yesterday.  Pain was intermittent and was of moderate/severe in intensity.  This was associated with nausea and belching without actually vomiting.  EMS was activated on arrival of EMS team, she was given fentanyl 75 mcg and Zofran  4 mg with some improvement the pain.  She states that other times when she had similar pain, the pain usually resolves after drinking some milk, but this time, the pain worsens.  She states that she usually take 1 to 2 tablets of Motrin  once or twice daily for several years.  She denies alcohol, tobacco or any occasional drug use.  Patient endorsed prior history of cholecystectomy and gastric sleeve surgery.  Patient is getting EGD/colonoscopy, but this has been delayed due to poor bowel prep on 7/14.  Hospital stay has also been complicated by hypotension but this may be patient's baseline and chronic-workup in process.    Assessment and Plan:  Transaminitis-due to suspected hypoperfusion from hypotension RUQ ultrasound and MRCP showed findings consistent with post cholecystectomy Hepatitis panel normal Gastroenterology consulted: Multiple labs ordered including alpha-1 antitrypsin, iron, varicella-zoster, LDH, anti-smooth muscle, ANA -continue to trend--currently trending down --Avoid hypotension   Abdominal pain and nausea Patient complains of epigastric pain which worsens with drinking milk She endorsed several year history of taking Motrin  once or twice daily ?  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 GI consult: Plan for EGD/colonoscopy when able/bowel prep finished   Chest pain -- appreciate cardiology  team consult and plan for lexiscan  study on 08/11/23   Elevated MCV MCV 100.5; B12 and folate levels are normal   Hypotension - Not on any oral blood pressure medicines (patient states blood pressure has been low at home for several months) - Added midodrine  - IV fluids while not taking in much by mouth -Echo unrevealing -TSH normal, cortisol low--cosyntropin  stim test reassuring   Schizophrenia - On mood as a - Patient is still in the hospital on 7/25, she will need to get that injection   DVT prophylaxis: SCDs Code Status: Full  Family Communication:  Disposition: Home    Consultants:   Procedures:   Antimicrobials:    Subjective: Pt says she had chest pain early in the morning.   Objective: Vitals:   08/10/23 0501 08/10/23 0540 08/10/23 0549 08/10/23 0733  BP: (!) 92/44 (!) 97/44 (!) 104/55 (!) 105/52  Pulse:  (!) 47 (!) 48 (!) 47  Resp: 16   17  Temp: 98.6 F (37 C)   97.7 F (36.5 C)  TempSrc: Oral   Oral  SpO2: 97% 98%  95%  Weight:      Height:        Intake/Output Summary (Last 24 hours) at 08/10/2023 1924 Last data filed at 08/10/2023 1700 Gross per 24 hour  Intake 780 ml  Output --  Net 780 ml   Filed Weights   08/05/23 1419 08/06/23 0224  Weight: 74.2 kg 78.3 kg   Examination:  General exam: Appears calm and comfortable  Respiratory system: Clear to auscultation. Respiratory effort normal. Cardiovascular system: normal S1 & S2 heard. No JVD, murmurs, rubs, gallops or clicks. No pedal edema. Gastrointestinal system: Abdomen is  nondistended, soft and nontender. No organomegaly or masses felt. Normal bowel sounds heard. Central nervous system: Alert and oriented. No focal neurological deficits. Extremities: Symmetric 5 x 5 power. Skin: No rashes, lesions or ulcers. Psychiatry: Judgement and insight appear normal. Mood & affect appropriate.   Data Reviewed: I have personally reviewed following labs and imaging studies  CBC: Recent Labs  Lab  08/05/23 1526 08/06/23 0844 08/07/23 0823 08/08/23 0403 08/09/23 0500 08/10/23 0510  WBC 6.5 4.2 4.7 5.1 4.2 6.0  NEUTROABS 5.1  --   --   --   --   --   HGB 12.3 11.5* 11.1* 12.1 10.1* 10.6*  HCT 37.9 34.6* 34.3* 38.3 31.7* 32.0*  MCV 100.5* 98.9 100.0 101.1* 100.0 99.7  PLT 223 186 182 197 166 169    Basic Metabolic Panel: Recent Labs  Lab 08/06/23 0354 08/06/23 0844 08/07/23 0823 08/08/23 0403 08/09/23 0500 08/10/23 0510  NA 136  --  138 135 139 138  K 3.8  --  3.8 3.4* 3.8 3.7  CL 104  --  105 101 107 107  CO2 25  --  26 25 24 25   GLUCOSE 125*  --  98 98 94 117*  BUN 11  --  11 10 7* 8  CREATININE 0.51  --  0.60 0.66 0.52 0.63  CALCIUM 8.6*  --  8.0* 8.4* 8.1* 8.1*  MG  --  1.9  --   --   --   --   PHOS  --  5.0*  --   --   --   --     CBG: No results for input(s): GLUCAP in the last 168 hours.  No results found for this or any previous visit (from the past 240 hours).   Radiology Studies: No results found.  Scheduled Meds:  benztropine   0.5 mg Oral Daily   diclofenac  Sodium  2 g Topical QID   famotidine   20 mg Oral Daily   feeding supplement  1 Container Oral TID BM   hydrocortisone   25 mg Rectal BID   lidocaine   1 patch Transdermal Q24H   pantoprazole   40 mg Oral Daily   sucralfate   1 g Oral TID WC & HS   Continuous Infusions:   LOS: 4 days   Time spent: 55 mins  Cleola Perryman Vicci, MD How to contact the [facility] Attending or Consulting provider 7A - 7P or covering provider during after hours 7P -7A, for this patient?  Check the care team in [facility] and look for a) attending/consulting TRH provider listed and b) the TRH team listed Log into [email or web address] to find provider on call.  Locate the TRH provider you are looking for under [facility] and page to a number that you can be directly reached. If you still have difficulty reaching the provider, please page the [facility] (Director on Call) for the Hospitalists listed on amion for  assistance.  08/10/2023, 7:24 PM

## 2023-08-11 ENCOUNTER — Other Ambulatory Visit: Payer: Self-pay | Admitting: Physician Assistant

## 2023-08-11 ENCOUNTER — Inpatient Hospital Stay (HOSPITAL_COMMUNITY)

## 2023-08-11 ENCOUNTER — Encounter: Admitting: Physical Medicine and Rehabilitation

## 2023-08-11 ENCOUNTER — Encounter (HOSPITAL_COMMUNITY): Payer: Self-pay | Admitting: Internal Medicine

## 2023-08-11 ENCOUNTER — Ambulatory Visit (INDEPENDENT_AMBULATORY_CARE_PROVIDER_SITE_OTHER): Payer: Self-pay | Admitting: Gastroenterology

## 2023-08-11 DIAGNOSIS — R42 Dizziness and giddiness: Secondary | ICD-10-CM | POA: Diagnosis not present

## 2023-08-11 DIAGNOSIS — I959 Hypotension, unspecified: Secondary | ICD-10-CM | POA: Diagnosis not present

## 2023-08-11 DIAGNOSIS — I493 Ventricular premature depolarization: Secondary | ICD-10-CM | POA: Diagnosis not present

## 2023-08-11 DIAGNOSIS — R079 Chest pain, unspecified: Secondary | ICD-10-CM | POA: Diagnosis not present

## 2023-08-11 DIAGNOSIS — R110 Nausea: Secondary | ICD-10-CM | POA: Diagnosis not present

## 2023-08-11 DIAGNOSIS — R1013 Epigastric pain: Secondary | ICD-10-CM | POA: Diagnosis not present

## 2023-08-11 DIAGNOSIS — R7401 Elevation of levels of liver transaminase levels: Secondary | ICD-10-CM | POA: Diagnosis not present

## 2023-08-11 LAB — NM MYOCAR MULTI W/SPECT W/WALL MOTION / EF
Base ST Depression (mm): 0 mm
LV dias vol: 78 mL (ref 46–106)
LV sys vol: 30 mL (ref 3.8–5.2)
MPHR: 158 {beats}/min
Nuc Stress EF: 61 %
Peak HR: 78 {beats}/min
Percent HR: 49 %
RATE: 0.5
Rest HR: 46 {beats}/min
Rest Nuclear Isotope Dose: 10.6 mCi
SDS: 1
SRS: 2
SSS: 3
ST Depression (mm): 0 mm
Stress Nuclear Isotope Dose: 31 mCi
TID: 1.06

## 2023-08-11 LAB — COMPREHENSIVE METABOLIC PANEL WITH GFR
ALT: 83 U/L — ABNORMAL HIGH (ref 0–44)
AST: 23 U/L (ref 15–41)
Albumin: 2.9 g/dL — ABNORMAL LOW (ref 3.5–5.0)
Alkaline Phosphatase: 143 U/L — ABNORMAL HIGH (ref 38–126)
Anion gap: 7 (ref 5–15)
BUN: 13 mg/dL (ref 8–23)
CO2: 27 mmol/L (ref 22–32)
Calcium: 8.6 mg/dL — ABNORMAL LOW (ref 8.9–10.3)
Chloride: 104 mmol/L (ref 98–111)
Creatinine, Ser: 0.64 mg/dL (ref 0.44–1.00)
GFR, Estimated: >60 mL/min (ref >60)
Glucose, Bld: 99 mg/dL (ref 70–99)
Potassium: 4.1 mmol/L (ref 3.5–5.1)
Sodium: 138 mmol/L (ref 135–145)
Total Bilirubin: 0.5 mg/dL (ref 0.0–1.2)
Total Protein: 5.5 g/dL — ABNORMAL LOW (ref 6.5–8.1)

## 2023-08-11 LAB — CBC
HCT: 33.9 % — ABNORMAL LOW (ref 36.0–46.0)
Hemoglobin: 10.7 g/dL — ABNORMAL LOW (ref 12.0–15.0)
MCH: 31.9 pg (ref 26.0–34.0)
MCHC: 31.6 g/dL (ref 30.0–36.0)
MCV: 101.2 fL — ABNORMAL HIGH (ref 80.0–100.0)
Platelets: 172 K/uL (ref 150–400)
RBC: 3.35 MIL/uL — ABNORMAL LOW (ref 3.87–5.11)
RDW: 12 % (ref 11.5–15.5)
WBC: 5.6 K/uL (ref 4.0–10.5)
nRBC: 0 % (ref 0.0–0.2)

## 2023-08-11 LAB — GLUCOSE, CAPILLARY
Glucose-Capillary: 114 mg/dL — ABNORMAL HIGH (ref 70–99)
Glucose-Capillary: 65 mg/dL — ABNORMAL LOW (ref 70–99)
Glucose-Capillary: 124 mg/dL — ABNORMAL HIGH (ref 70–99)

## 2023-08-11 LAB — SURGICAL PATHOLOGY

## 2023-08-11 MED ORDER — SODIUM CHLORIDE FLUSH 0.9 % IV SOLN
INTRAVENOUS | Status: AC
  Administered 2023-08-11: 10 mL via INTRAVENOUS
  Filled 2023-08-11: qty 10

## 2023-08-11 MED ORDER — MIDODRINE HCL 5 MG PO TABS
2.5000 mg | ORAL_TABLET | Freq: Three times a day (TID) | ORAL | Status: DC
  Administered 2023-08-11 – 2023-08-12 (×3): 2.5 mg via ORAL
  Filled 2023-08-11 (×3): qty 1

## 2023-08-11 MED ORDER — TECHNETIUM TC 99M TETROFOSMIN IV KIT
10.0000 | PACK | Freq: Once | INTRAVENOUS | Status: AC | PRN
  Administered 2023-08-11: 10.6 via INTRAVENOUS

## 2023-08-11 MED ORDER — ENSURE PLUS HIGH PROTEIN PO LIQD
237.0000 mL | Freq: Two times a day (BID) | ORAL | Status: DC
  Administered 2023-08-11 – 2023-08-12 (×3): 237 mL via ORAL

## 2023-08-11 MED ORDER — METHOCARBAMOL 1000 MG/10ML IJ SOLN
500.0000 mg | Freq: Once | INTRAMUSCULAR | Status: AC
  Administered 2023-08-11: 500 mg via INTRAVENOUS
  Filled 2023-08-11: qty 10

## 2023-08-11 MED ORDER — BUTALBITAL-APAP-CAFFEINE 50-325-40 MG PO TABS
1.0000 | ORAL_TABLET | Freq: Once | ORAL | Status: AC
  Administered 2023-08-11: 1 via ORAL
  Filled 2023-08-11: qty 1

## 2023-08-11 MED ORDER — PANTOPRAZOLE SODIUM 40 MG PO TBEC
40.0000 mg | DELAYED_RELEASE_TABLET | Freq: Two times a day (BID) | ORAL | Status: DC
  Administered 2023-08-11 – 2023-08-12 (×2): 40 mg via ORAL
  Filled 2023-08-11 (×2): qty 1

## 2023-08-11 MED ORDER — REGADENOSON 0.4 MG/5ML IV SOLN
INTRAVENOUS | Status: AC
Start: 2023-08-11 — End: 2023-08-11
  Administered 2023-08-11: 0.4 mg via INTRAVENOUS
  Filled 2023-08-11: qty 5

## 2023-08-11 MED ORDER — TECHNETIUM TC 99M TETROFOSMIN IV KIT
30.0000 | PACK | Freq: Once | INTRAVENOUS | Status: AC | PRN
  Administered 2023-08-11: 31 via INTRAVENOUS

## 2023-08-11 NOTE — Progress Notes (Addendum)
 Rounding Note   Patient Name: GERARD BONUS Date of Encounter: 08/11/2023  [facility] Cardiologist: None   Subjective Interviewed patient this am during stress test. Reports episode of chest and back pain that last for 3 hours that improved some after muscle relaxer. Currently prior to lexiscan , mild chest pain still present described as tightness 8/10 located like a band around the chest radiating to shoulders. CP was unchanged by test.   Scheduled Meds:  benztropine   0.5 mg Oral Daily   diclofenac  Sodium  2 g Topical QID   famotidine   20 mg Oral Daily   feeding supplement  237 mL Oral BID BM   hydrocortisone   25 mg Rectal BID   lidocaine   1 patch Transdermal Q24H   pantoprazole   40 mg Oral Daily   sucralfate   1 g Oral TID WC & HS   Continuous Infusions:  PRN Meds: bisacodyl , busPIRone , ondansetron  (ZOFRAN ) IV, technetium tetrofosmin , technetium tetrofosmin , traMADol , traZODone , witch hazel-glycerin    Vital Signs  Vitals:   08/10/23 0733 08/10/23 1944 08/10/23 2321 08/11/23 0308  BP: (!) 105/52 (!) 94/53 (!) 93/52 112/65  Pulse: (!) 47 (!) 59  (!) 48  Resp: 17 19 18 17   Temp: 97.7 F (36.5 C) 97.9 F (36.6 C) 98 F (36.7 C) 97.6 F (36.4 C)  TempSrc: Oral Oral Oral Oral  SpO2: 95% 99% 98% 100%  Weight:      Height:        Intake/Output Summary (Last 24 hours) at 08/11/2023 0850 Last data filed at 08/11/2023 9487 Gross per 24 hour  Intake 780 ml  Output --  Net 780 ml      08/06/2023    2:24 AM 08/05/2023    2:19 PM 06/16/2023    8:13 AM  Last 3 Weights  Weight (lbs) 172 lb 9.9 oz 163 lb 9.3 oz 163 lb 9.6 oz  Weight (kg) 78.3 kg 74.2 kg 74.208 kg      ECG  Sinus Bradycardia, HR 44 - Personally Reviewed  Physical Exam GEN: No acute distress.   Neck: No JVD Cardiac: RRR, no murmurs, rubs, or gallops.  Respiratory: Clear to auscultation bilaterally. GI: Soft, nontender, non-distended  MS: No edema; No deformity. Neuro:  Nonfocal  Psych:  Normal affect   Labs High Sensitivity Troponin:   Recent Labs  Lab 08/05/23 1538 08/10/23 0510 08/10/23 0854  TROPONINIHS 4 2 2      Chemistry Recent Labs  Lab 08/06/23 0844 08/07/23 0823 08/08/23 0403 08/09/23 0500 08/10/23 0510  NA  --  138 135 139 138  K  --  3.8 3.4* 3.8 3.7  CL  --  105 101 107 107  CO2  --  26 25 24 25   GLUCOSE  --  98 98 94 117*  BUN  --  11 10 7* 8  CREATININE  --  0.60 0.66 0.52 0.63  CALCIUM  --  8.0* 8.4* 8.1* 8.1*  MG 1.9  --   --   --   --   PROT  --  5.1* 6.0*  --  5.0*  ALBUMIN  --  2.7* 3.4*  --  2.7*  AST  --  266* 154*  --  34  ALT  --  301* 254*  --  101*  ALKPHOS  --  214* 217*  --  152*  BILITOT  --  1.0 1.0  --  0.8  GFRNONAA  --  >60 >60 >60 >60  ANIONGAP  --  7  9 8 6     Lipids No results for input(s): CHOL, TRIG, HDL, LABVLDL, LDLCALC, CHOLHDL in the last 168 hours.  Hematology Recent Labs  Lab 08/09/23 0500 08/10/23 0510 08/11/23 0430  WBC 4.2 6.0 5.6  RBC 3.17* 3.21* 3.35*  HGB 10.1* 10.6* 10.7*  HCT 31.7* 32.0* 33.9*  MCV 100.0 99.7 101.2*  MCH 31.9 33.0 31.9  MCHC 31.9 33.1 31.6  RDW 11.8 11.9 12.0  PLT 166 169 172   Thyroid  Recent Labs  Lab 08/08/23 0403  TSH 3.190    BNPNo results for input(s): BNP, PROBNP in the last 168 hours.  DDimer No results for input(s): DDIMER in the last 168 hours.   Radiology  No results found.  Cardiac Studies ECHO 08/07/2023 IMPRESSIONS   1. Left ventricular ejection fraction, by estimation, is 55 to 60%. The  left ventricle has normal function. The left ventricle has no regional  wall motion abnormalities. Left ventricular diastolic parameters were  normal.   2. Right ventricular systolic function is normal. The right ventricular  size is normal. There is mildly elevated pulmonary artery systolic  pressure. The estimated right ventricular systolic pressure is 36.9 mmHg.   3. The mitral valve is normal in structure. Trivial mitral valve   regurgitation. No evidence of mitral stenosis.   4. The aortic valve is grossly normal. There is mild calcification of the  aortic valve. Aortic valve regurgitation is not visualized. No aortic  stenosis is present.   5. The inferior vena cava is dilated in size with <50% respiratory  variability, suggesting right atrial pressure of 15 mmHg.   Patient Profile   63 y.o. female with a hx of anxiety, schizophrenia who is being seen 08/10/2023 for the evaluation of chest pain at the request of Dr. Vicci.   Assessment & Plan  Atypical Chest pain Reports atypical chest pain features as above.  Less concern for ischemia with negative troponin, EKG without ischemic changes, normal echo, and atypical presentation.  However with history of tobacco use and family history, I think patient would benefit from further ischemic evaluation.   Would not consider for cath at this time.  Discussed exercise myoview  versus Lexiscan  vs coronary CTA.  Patient does not think she can walk on treadmill and would prefer Lexiscan .  Lexiscan  could be done either inpatient or outpatient.  However since limited transportation, would consider inpatient Lexiscan .  Lexiscan  in process this am.  Jacqualin patient this am during stress test. Reports episode of chest and back pain that last for 3 hours that improved some after muscle relaxer. Currently prior to lexiscan , mild chest pain still present described as tightness 8/10 located like a band around the chest radiating to shoulders. CP was unchanged by test. .    Hypotension This a.m. BP on soft side 100/64 Not on any oral BP medication.  Reports history of low BP for several months. Noted midodrine  was ordered but do not see in med history.  Continue IV fluids Orthostatics vitals were negative yesterday   PVC  Palpitations  Reports palpitations in correlation to chest pain as above.  EKG showed normal sinus bradycardia, HR 44 Not able to interpret telemetry since  in stress test lab.  May need to consider outpatient monitor.    Transaminitis Not on any statin medications Managed by primary team and GI.   For questions or updates, please contact [facility] Please consult [email or web address] for contact info under     Signed, Lorette CINDERELLA Kapur,  PA-C  08/11/2023, 8:50 AM     Attending Note  Patient seen and discussed with PA Sheron, I agree with her documentation.  1.Chest pain - very mixed description of symptoms.  -EKG and enzymes have been benign, echo benign - lexiscan  this morning without signs of ischemia - noncardiac chest pain, no further cardiac testing is planned.    2. Dizziness - EKGs 50s to 70s. Tele can be in the 40s at times.  - not on any av nodal agents, from review rare <1% on buspar  - dizziness at home but primarily orthostatic in description, she endorses poor oral hydration. Orthostatics are negative this AM. Has had some intermittent low bp's this admission. Has received IVFs. Normal stim test.  - would plan for outpatient monitor - ambulate patient and document heart rates - still soft bp's at times, can try midodrine  2.5mg  tid.     3. Elevated LFTs - from notes thought secondary to transient hypotension  Dorn Ross MD

## 2023-08-11 NOTE — Progress Notes (Signed)
 Patient called to the desk for burning in her chest. I entered the room to find the patient laying in bed rubbing her chest. Patient stated her chest felt tight with a pain score of 8 out of a 0-10 pain scale. Patient also stated the pain radiated to her back, left shoulder, and that her arms felt numb and tingly. I asked the charge nurse to assist me while I obtained a 12-lead EKG on the patient and a set of vitals. EKG resulted with sinus bradycardia and otherwise normal ECG. Rosina Gosling, RN entered the room and initiated the rapid response. Doctors Ricky Louder, and Bryn all arrived at bedside. Patient's CBG resulted 64. Patient able to take fluids orally and Juice was provided to the patient. Patient intake 240 ml of grape juice. Patient's follow-up CBG resulted 114. Pain medication administered orally.

## 2023-08-11 NOTE — Progress Notes (Signed)
 PROGRESS NOTE   Vicki Thomas  FMW:[id number] DOB: 09/20/1960 DOA: 08/05/2023 PCP: Bevely Doffing, FNP   Chief Complaint  Patient presents with   Abdominal Cramping   Level of care: Med-Surg  Brief Admission History:  63 y.o. female with medical history significant of anxiety who presents to the emergency department due to epigastric pain which started yesterday.  Pain was intermittent and was of moderate/severe in intensity.  This was associated with nausea and belching without actually vomiting.  EMS was activated on arrival of EMS team, she was given fentanyl 75 mcg and Zofran  4 mg with some improvement the pain.  She states that other times when she had similar pain, the pain usually resolves after drinking some milk, but this time, the pain worsens.  She states that she usually take 1 to 2 tablets of Motrin  once or twice daily for several years.  She denies alcohol, tobacco or any occasional drug use.  Patient endorsed prior history of cholecystectomy and gastric sleeve surgery.  Patient is getting EGD/colonoscopy, but this has been delayed due to poor bowel prep on 7/14.  Hospital stay has also been complicated by hypotension but this may be patient's baseline and chronic-workup in process.    Assessment and Plan:  Transaminitis-due to suspected hypoperfusion from hypotension RUQ ultrasound and MRCP showed findings consistent with post cholecystectomy Hepatitis panel normal Gastroenterology consulted: Multiple labs ordered including alpha-1 antitrypsin, iron, varicella-zoster, LDH, anti-smooth muscle, ANA -continue to trend--currently trending down --Avoid hypotension   Abdominal pain and nausea Patient complains of epigastric pain which worsens with drinking milk She endorsed several year history of taking Motrin  once or twice daily ?  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   - Eight  2 to 5 mm polyps in the transverse colon, removed with a cold snare.  Resected and retrieved.  - One 3 mm polyp in the rectum, removed with a cold snare.  Resected and retrieved.  - Diverticulosis in the sigmoid colon, in the descending colon and in the ascending colon.    Chest pain -- appreciate cardiology team consult and plan for lexiscan  study on 08/11/23  - this was a normal study as reported  Elevated MCV MCV 100.5; B12 and folate levels are normal   Hypotension - Not on any oral blood pressure medicines (patient states blood pressure has been low at home for several months) - Added midodrine  2.5 mg TID  -Echo unrevealing -TSH normal, cortisol low--cosyntropin  stim test reassuring   Schizophrenia - On mood Uzedy  every 14 days - Patient is still in the hospital on 7/25, she will need to get that injection   DVT prophylaxis: SCDs Code Status: Full  Family Communication:  Disposition: Home    Consultants:   Procedures:   Antimicrobials:    Subjective: Pt says 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 Respiratory effort normal. Cardiovascular system: normal S1 & S2 heard. No JVD, murmurs, rubs, gallops or clicks. No pedal edema. Gastrointestinal system: Abdomen is nondistended, soft and nontender. No  organomegaly or masses felt. Normal bowel sounds heard. Central nervous system: Alert and oriented. No focal neurological deficits. Extremities: Symmetric 5 x 5 power. Skin: No rashes, lesions or ulcers. Psychiatry: Judgement and insight appear normal. Mood & affect appropriate.   Data Reviewed: I have personally reviewed following labs and imaging studies  CBC: Recent Labs  Lab 08/05/23 1526 08/06/23 0844 08/07/23 0823 08/08/23 0403 08/09/23 0500 08/10/23 0510 08/11/23 0430  WBC 6.5   < > 4.7 5.1 4.2 6.0 5.6  NEUTROABS 5.1  --   --   --   --   --   --   HGB 12.3   < > 11.1* 12.1 10.1* 10.6* 10.7*  HCT 37.9   < > 34.3* 38.3 31.7* 32.0* 33.9*  MCV 100.5*   < > 100.0 101.1* 100.0 99.7 101.2*  PLT 223   < > 182 197 166 169 172   < > = values in this interval not displayed.    Basic Metabolic Panel: Recent Labs  Lab 08/06/23 0844 08/07/23 0823 08/08/23 0403 08/09/23 0500 08/10/23 0510 08/11/23 0430  NA  --  138 135 139 138 138  K  --  3.8 3.4* 3.8 3.7 4.1  CL  --  105 101 107 107 104  CO2  --  26 25 24 25 27   GLUCOSE  --  98 98 94 117* 99  BUN  --  11 10 7* 8 13  CREATININE  --  0.60 0.66 0.52 0.63 0.64  CALCIUM  --  8.0* 8.4* 8.1* 8.1* 8.6*  MG 1.9  --   --   --   --   --   PHOS 5.0*  --   --   --   --   --     CBG: Recent Labs  Lab 08/11/23 1548 08/11/23 1559  GLUCAP 65* 114*    No results found for this or any previous visit (from the past 240 hours).   Radiology Studies: NM Myocar Multi W/Spect W/Wall Motion / EF Result Date: 08/11/2023   The study is normal. There are no perfusion defects consistent with prior infarct or current ischemia.  The study is low risk.   No ST deviation was noted.   Left ventricular function is normal. Nuclear stress EF: 61%. The left ventricular ejection fraction is normal (55-65%). End diastolic cavity size is normal.   There is a moderate size mild to moderate intensity inferior defect most intense in the resting images with  normal wall motion. Finding is consistent with artifact due to adjacent gut radiotracer uptake.    Scheduled Meds:  benztropine   0.5 mg Oral Daily   diclofenac  Sodium  2 g Topical QID   famotidine   20 mg Oral Daily   feeding supplement  237 mL Oral BID BM   hydrocortisone   25 mg Rectal BID   lidocaine   1 patch Transdermal Q24H   midodrine   2.5 mg Oral TID WC   pantoprazole   40 mg Oral BID   sucralfate   1 g Oral TID  WC & HS   Continuous Infusions:   LOS: 5 days   Time spent: 55 mins  Maximilliano Kersh Vicci, MD How to contact the [facility] Attending or Consulting provider 7A - 7P or covering provider during after hours 7P -7A, for this patient?  Check the care team in [facility] and look for a) attending/consulting TRH provider listed and b) the TRH team listed Log into [email or web address] to find provider on call.  Locate the TRH provider you are looking for under [facility] and page to a number that you can be directly reached. If you still have difficulty reaching the provider, please page the  [facility] (Director on Call) for the Hospitalists listed on amion for assistance.  08/11/2023, 5:39 PM

## 2023-08-11 NOTE — Progress Notes (Signed)
   08/11/23 1552  Vitals  Temp 98.1 F (36.7 C)  Temp Source Oral  BP (!) 103/49  BP Location Left Arm  BP Method Automatic  Patient Position (if appropriate) Lying  Pulse Rate (!) 58  Pulse Rate Source Monitor  Resp (!) 24  Level of Consciousness  Level of Consciousness Alert  MEWS COLOR  MEWS Score Color Green  Oxygen Therapy  SpO2 99 %  O2 Device Room Air  Pain Assessment  Pain Scale 0-10  Pain Score 8  Pain Type Chronic pain  Pain Location Chest  Pain Orientation Right;Left (feels like tightening up and band across)  Pain Descriptors / Indicators Dull;Tightness  Pain Frequency Intermittent  Pain Onset On-going  Pain Intervention(s) MD notified (Comment);RN made aware;Repositioned (rapid response since patient reports worsened pain)  Multiple Pain Sites No  MEWS Score  MEWS Temp 0  MEWS Systolic 0  MEWS Pulse 0  MEWS RR 1  MEWS LOC 0  MEWS Score 1  Provider Notification  Provider Name/Title Dr Vicci  Date Provider Notified 08/11/23  Time Provider Notified 1552  Method of Notification  (rapid response called)  Notification Reason Change in status  Provider response At bedside  Date of Provider Response 08/11/23  Time of Provider Response 1552  Rapid Response Notification  Name of Rapid Response RN Notified anne hunt, house supervisor  Date Rapid Response Notified 08/11/23  Time Rapid Response Notified 1552   Called to assist assigned nurse due to complaints of worsened chest pain, patient reports tightening and band across - dull, numbness to arm/hands, HR brady in 50s (baseline per nurse report), had stress test this morning, h/o chest pain per review of provider notes. Rapid response called and Dr Vicci, Dr Ricky to bedside to respond. EKG obtained and provider notified. Vitals obtained. Patient remains alert and oriented, CBG obtained - 65. Juice provided and provider aware. Provider reviewing ordered GI medications as well.

## 2023-08-11 NOTE — Progress Notes (Signed)
 Patient ambulated in the hallway with the nurse tech. Pulse rate measured up to 94 after walking 75 feet and patient stated she felt dizzy and sick when she got back to the room.

## 2023-08-11 NOTE — Progress Notes (Signed)
     Vicki Thomas presented for a Lexiscan  nuclear stress test today.  I Lorette CINDERELLA Kapur, PA-C, provided direct supervision and was present during the stress portion of the study today, which was completed without significant symptoms, immediate complications, or acute ST/T changes on ECG.  Stress imaging is pending at this time.  Preliminary ECG findings may be listed in the chart, but the stress test result will not be finalized until perfusion imaging is complete.  Lorette CINDERELLA Kapur, PA-C  08/11/2023, 9:04 AM

## 2023-08-11 NOTE — Progress Notes (Signed)
 3 yr TCS noted in recall Patient result letter mailed

## 2023-08-12 ENCOUNTER — Inpatient Hospital Stay

## 2023-08-12 ENCOUNTER — Telehealth: Payer: Self-pay | Admitting: *Deleted

## 2023-08-12 DIAGNOSIS — R001 Bradycardia, unspecified: Secondary | ICD-10-CM | POA: Diagnosis not present

## 2023-08-12 DIAGNOSIS — R42 Dizziness and giddiness: Secondary | ICD-10-CM

## 2023-08-12 DIAGNOSIS — R1013 Epigastric pain: Secondary | ICD-10-CM | POA: Diagnosis not present

## 2023-08-12 DIAGNOSIS — K625 Hemorrhage of anus and rectum: Secondary | ICD-10-CM

## 2023-08-12 DIAGNOSIS — R110 Nausea: Secondary | ICD-10-CM | POA: Diagnosis not present

## 2023-08-12 DIAGNOSIS — R7401 Elevation of levels of liver transaminase levels: Secondary | ICD-10-CM | POA: Diagnosis not present

## 2023-08-12 LAB — COMPREHENSIVE METABOLIC PANEL WITH GFR
ALT: 56 U/L — ABNORMAL HIGH (ref 0–44)
AST: 18 U/L (ref 15–41)
Albumin: 2.6 g/dL — ABNORMAL LOW (ref 3.5–5.0)
Alkaline Phosphatase: 118 U/L (ref 38–126)
Anion gap: 8 (ref 5–15)
BUN: 13 mg/dL (ref 8–23)
CO2: 30 mmol/L (ref 22–32)
Calcium: 8.2 mg/dL — ABNORMAL LOW (ref 8.9–10.3)
Chloride: 105 mmol/L (ref 98–111)
Creatinine, Ser: 0.62 mg/dL (ref 0.44–1.00)
GFR, Estimated: >60 mL/min (ref >60)
Glucose, Bld: 98 mg/dL (ref 70–99)
Potassium: 4.3 mmol/L (ref 3.5–5.1)
Sodium: 143 mmol/L (ref 135–145)
Total Bilirubin: 0.4 mg/dL (ref 0.0–1.2)
Total Protein: 4.8 g/dL — ABNORMAL LOW (ref 6.5–8.1)

## 2023-08-12 LAB — GLUCOSE, CAPILLARY
Glucose-Capillary: 103 mg/dL — ABNORMAL HIGH (ref 70–99)
Glucose-Capillary: 104 mg/dL — ABNORMAL HIGH (ref 70–99)
Glucose-Capillary: 76 mg/dL (ref 70–99)
Glucose-Capillary: 91 mg/dL (ref 70–99)
Glucose-Capillary: 99 mg/dL (ref 70–99)

## 2023-08-12 LAB — HSV DNA BY PCR (REFERENCE LAB)
HSV 1 DNA: NEGATIVE
HSV 2 DNA: NEGATIVE

## 2023-08-12 MED ORDER — BLOOD GLUCOSE MONITORING SUPPL DEVI: 1.0000 | Freq: Three times a day (TID) | 0 refills | Status: AC

## 2023-08-12 MED ORDER — TRAZODONE HCL 50 MG PO TABS: 50.0000 mg | ORAL_TABLET | Freq: Every evening | ORAL | Status: DC | PRN

## 2023-08-12 MED ORDER — PANTOPRAZOLE SODIUM 40 MG PO TBEC: 40.0000 mg | DELAYED_RELEASE_TABLET | Freq: Every day | ORAL | 1 refills | Status: DC

## 2023-08-12 MED ORDER — MIDODRINE HCL 5 MG PO TABS: 5.0000 mg | ORAL_TABLET | Freq: Three times a day (TID) | ORAL | 1 refills | Status: DC

## 2023-08-12 MED ORDER — POTASSIUM CHLORIDE ER 10 MEQ PO TBCR
10.0000 meq | EXTENDED_RELEASE_TABLET | Freq: Every day | ORAL | Status: AC | PRN
Start: 2023-08-12 — End: ?

## 2023-08-12 MED ORDER — LANCET DEVICE MISC: 1.0000 | Freq: Three times a day (TID) | 0 refills | Status: AC

## 2023-08-12 MED ORDER — HYDROCORTISONE ACETATE 25 MG RE SUPP: 25.0000 mg | Freq: Two times a day (BID) | RECTAL | 0 refills | Status: AC

## 2023-08-12 MED ORDER — POLYETHYLENE GLYCOL 3350 17 G PO PACK: 17.0000 g | PACK | Freq: Every day | ORAL | 1 refills | Status: AC

## 2023-08-12 MED ORDER — BLOOD GLUCOSE TEST VI STRP: 1.0000 | ORAL_STRIP | Freq: Three times a day (TID) | 0 refills | Status: DC

## 2023-08-12 MED ORDER — BUSPIRONE HCL 7.5 MG PO TABS: 3.7500 mg | ORAL_TABLET | Freq: Three times a day (TID) | ORAL | Status: DC | PRN

## 2023-08-12 MED ORDER — MIDODRINE HCL 5 MG PO TABS
2.5000 mg | ORAL_TABLET | Freq: Once | ORAL | Status: AC
  Administered 2023-08-12: 2.5 mg via ORAL

## 2023-08-12 MED ORDER — LANCETS MISC. MISC: 1.0000 | Freq: Three times a day (TID) | 0 refills | Status: AC

## 2023-08-12 MED ORDER — MIDODRINE HCL 5 MG PO TABS
5.0000 mg | ORAL_TABLET | Freq: Three times a day (TID) | ORAL | Status: DC
  Administered 2023-08-12 (×2): 5 mg via ORAL
  Filled 2023-08-12 (×2): qty 1

## 2023-08-12 NOTE — Telephone Encounter (Signed)
-----   Message from Laymon CHRISTELLA Qua sent at 08/12/2023 10:34 AM EDT ----- Regarding: Zio patch This patient needs a 1-week Zio patch for dizziness and bradycardia per Dr. Alvan. Can be mailed to the patient.   Thanks,  [place]

## 2023-08-12 NOTE — Telephone Encounter (Signed)
Zio monitor ordered.   

## 2023-08-12 NOTE — Progress Notes (Signed)
 Rounding Note   Patient Name: RIYANSHI WAHAB Date of Encounter: 08/12/2023  [facility] Cardiologist: New  Subjective Some dizziness this morning with getting up to walk to bathroom.   Scheduled Meds:  benztropine   0.5 mg Oral Daily   diclofenac  Sodium  2 g Topical QID   famotidine   20 mg Oral Daily   feeding supplement  237 mL Oral BID BM   hydrocortisone   25 mg Rectal BID   lidocaine   1 patch Transdermal Q24H   midodrine   2.5 mg Oral TID WC   pantoprazole   40 mg Oral BID   sucralfate   1 g Oral TID WC & HS   Continuous Infusions:  PRN Meds: bisacodyl , busPIRone , ondansetron  (ZOFRAN ) IV, traMADol , traZODone , witch hazel-glycerin    Vital Signs  Vitals:   08/11/23 1552 08/11/23 1557 08/11/23 2047 08/12/23 0516  BP: (!) 103/49 110/61 (!) 92/52 (!) 101/45  Pulse: (!) 58 62 (!) 54 (!) 49  Resp: (!) 24 20 19 18   Temp: 98.1 F (36.7 C)  97.7 F (36.5 C) 97.6 F (36.4 C)  TempSrc: Oral  Oral Oral  SpO2: 99% 100% 95% 97%  Weight:      Height:        Intake/Output Summary (Last 24 hours) at 08/12/2023 1007 Last data filed at 08/11/2023 1700 Gross per 24 hour  Intake 480 ml  Output --  Net 480 ml      08/06/2023    2:24 AM 08/05/2023    2:19 PM 06/16/2023    8:13 AM  Last 3 Weights  Weight (lbs) 172 lb 9.9 oz 163 lb 9.3 oz 163 lb 9.6 oz  Weight (kg) 78.3 kg 74.2 kg 74.208 kg      Telemetry N/a - Personally Reviewed  ECG  N/a - Personally Reviewed  Physical Exam  GEN: No acute distress.   Neck: No JVD Cardiac: RRR, no murmurs, rubs, or gallops.  Respiratory: Clear to auscultation bilaterally. GI: Soft, nontender, non-distended  MS: No edema; No deformity. Neuro:  Nonfocal  Psych: Normal affect   Labs High Sensitivity Troponin:   Recent Labs  Lab 08/05/23 1538 08/10/23 0510 08/10/23 0854  TROPONINIHS 4 2 2      Chemistry Recent Labs  Lab 08/06/23 0844 08/07/23 0823 08/10/23 0510 08/11/23 0430 08/12/23 0456  NA  --    < > 138 138  143  K  --    < > 3.7 4.1 4.3  CL  --    < > 107 104 105  CO2  --    < > 25 27 30   GLUCOSE  --    < > 117* 99 98  BUN  --    < > 8 13 13   CREATININE  --    < > 0.63 0.64 0.62  CALCIUM  --    < > 8.1* 8.6* 8.2*  MG 1.9  --   --   --   --   PROT  --    < > 5.0* 5.5* 4.8*  ALBUMIN  --    < > 2.7* 2.9* 2.6*  AST  --    < > 34 23 18  ALT  --    < > 101* 83* 56*  ALKPHOS  --    < > 152* 143* 118  BILITOT  --    < > 0.8 0.5 0.4  GFRNONAA  --    < > >60 >60 >60  ANIONGAP  --    < >  6 7 8    < > = values in this interval not displayed.    Lipids No results for input(s): CHOL, TRIG, HDL, LABVLDL, LDLCALC, CHOLHDL in the last 168 hours.  Hematology Recent Labs  Lab 08/09/23 0500 08/10/23 0510 08/11/23 0430  WBC 4.2 6.0 5.6  RBC 3.17* 3.21* 3.35*  HGB 10.1* 10.6* 10.7*  HCT 31.7* 32.0* 33.9*  MCV 100.0 99.7 101.2*  MCH 31.9 33.0 31.9  MCHC 31.9 33.1 31.6  RDW 11.8 11.9 12.0  PLT 166 169 172   Thyroid  Recent Labs  Lab 08/08/23 0403  TSH 3.190    BNPNo results for input(s): BNP, PROBNP in the last 168 hours.  DDimer No results for input(s): DDIMER in the last 168 hours.   Radiology  NM Myocar Multi W/Spect W/Wall Motion / EF Result Date: 08/11/2023   The study is normal. There are no perfusion defects consistent with prior infarct or current ischemia.  The study is low risk.   No ST deviation was noted.   Left ventricular function is normal. Nuclear stress EF: 61%. The left ventricular ejection fraction is normal (55-65%). End diastolic cavity size is normal.   There is a moderate size mild to moderate intensity inferior defect most intense in the resting images with normal wall motion. Finding is consistent with artifact due to adjacent gut radiotracer uptake.      Patient Profile   63 y.o. female with a hx of anxiety, schizophrenia who is being seen 08/10/2023 for the evaluation of chest pain at the request of Dr. Vicci.   Assessment & Plan  1.Chest  pain - very mixed description of symptoms.  -EKG and enzymes have been benign, echo benign - lexiscan  without signs of ischemia - noncardiac chest pain, no further cardiac testing is planned.    2. Dizziness - EKGs 50s to 70s. Tele can be in the 40s at times.  - not on any av nodal agents, from review rare <1% bradycardia on buspar  - dizziness at home but primarily orthostatic in description, she endorses poor oral hydration. Orthostatics are negative this admission. Has had some intermittent low bp's this admission. Has received IVFs. Normal stim test.  - would plan for outpatient monitor - ambulate patient and document heart rates - still soft bp's at times, started on midodrine  2.5mg  tid. BP's remain soft, titrate to 5mg  tid -tele order expired, reorder. Have not seen degree of bradycardia that would explain her episodes alone. Reorder tele, plan for outpatient monitor.       3. Elevated LFTs - from notes thought secondary to transient hypotension   For questions or updates, please contact [facility] Please consult [email or web address] for contact info under     Signed, Alvan Carrier, MD  08/12/2023, 10:07 AM

## 2023-08-12 NOTE — Discharge Instructions (Signed)
 IMPORTANT INFORMATION: PAY CLOSE ATTENTION   PHYSICIAN DISCHARGE INSTRUCTIONS  Follow with Primary care provider  Bevely Doffing, FNP  and other consultants as instructed by your Hospitalist Physician  SEEK MEDICAL CARE OR RETURN TO EMERGENCY ROOM IF SYMPTOMS COME BACK, WORSEN OR NEW PROBLEM DEVELOPS   Please note: You were cared for by a hospitalist during your hospital stay. Every effort will be made to forward records to your primary care provider.  You can request that your primary care provider send for your hospital records if they have not received them.  Once you are discharged, your primary care physician will handle any further medical issues. Please note that NO REFILLS for any discharge medications will be authorized once you are discharged, as it is imperative that you return to your primary care physician (or establish a relationship with a primary care physician if you do not have one) for your post hospital discharge needs so that they can reassess your need for medications and monitor your lab values.  Please get a complete blood count and chemistry panel checked by your Primary MD at your next visit, and again as instructed by your Primary MD.  Get Medicines reviewed and adjusted: Please take all your medications with you for your next visit with your Primary MD  Laboratory/radiological data: Please request your Primary MD to go over all hospital tests and procedure/radiological results at the follow up, please ask your primary care provider to get all Hospital records sent to his/her office.  In some cases, they will be blood work, cultures and biopsy results pending at the time of your discharge. Please request that your primary care provider follow up on these results.  If you are diabetic, please bring your blood sugar readings with you to your follow up appointment with primary care.    Please call and make your follow up appointments as soon as possible.    Also Note  the following: If you experience worsening of your admission symptoms, develop shortness of breath, life threatening emergency, suicidal or homicidal thoughts you must seek medical attention immediately by calling 911 or calling your MD immediately  if symptoms less severe.  You must read complete instructions/literature along with all the possible adverse reactions/side effects for all the Medicines you take and that have been prescribed to you. Take any new Medicines after you have completely understood and accpet all the possible adverse reactions/side effects.   Do not drive when taking Pain medications or sleeping medications (Benzodiazepines)  Do not take more than prescribed Pain, Sleep and Anxiety Medications. It is not advisable to combine anxiety,sleep and pain medications without talking with your primary care practitioner  Special Instructions: If you have smoked or chewed Tobacco  in the last 2 yrs please stop smoking, stop any regular Alcohol  and or any Recreational drug use.  Wear Seat belts while driving.  Do not drive if taking any narcotic, mind altering or controlled substances or recreational drugs or alcohol.

## 2023-08-12 NOTE — Discharge Summary (Signed)
 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 Admission provider HPI:  63 y.o. female with medical history significant of anxiety who presents to the emergency department due to epigastric pain which started yesterday.  Pain was intermittent and was of moderate/severe in intensity.  This was associated with nausea and belching without actually vomiting.  EMS was activated on arrival of EMS team, she was given fentanyl 75 mcg and Zofran  4 mg with some improvement the pain.  She states that other times when she had similar pain, the pain usually resolves after drinking some milk, but this time, the pain worsens.  She states that she usually take 1 to 2 tablets of Motrin  once or twice daily for several years.  She denies alcohol, tobacco or any occasional drug use.  Patient endorsed prior history of cholecystectomy and gastric sleeve surgery.  Patient is getting EGD/colonoscopy, but this has been delayed due to poor bowel prep on 7/14.  Hospital stay has also been complicated by hypotension but this may be patient's  baseline and chronic-workup in process.   Hospital Course by listed problems addressed   Transaminitis-due to suspected hypoperfusion from hypotension RUQ ultrasound and MRCP showed findings consistent with post cholecystectomy Hepatitis panel normal Gastroenterology consulted: Multiple labs ordered including alpha-1 antitrypsin, iron, varicella-zoster, LDH, anti-smooth muscle, ANA -continue to trend--currently trending down --Avoid hypotension -- pt has been started on midodrine  5 mg TID, titrate dose outpatient if needed for low blood pressure   Abdominal pain and nausea Patient complains of epigastric pain which worsens with drinking milk She endorsed several year history of taking Motrin  once or twice daily ?  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   - Eight 2 to 5 mm polyps in the transverse colon, removed with a cold snare.  Resected and retrieved.  - One 3 mm polyp in the rectum, removed with a cold snare.  Resected and retrieved.  - Diverticulosis in the sigmoid colon, in the descending colon and in the ascending colon.  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  - glucose monitor ordered - frequent small meals throughout day (grazing diet) recommended  - pt to speak with PCP to discuss continuous glucose monitoring    Chest pain -- appreciate cardiology team consult and plan for lexiscan  study on 08/11/23  - this was a normal study as reported --  her pain is thought related to GI cause noted above on EGD findings    Elevated MCV MCV 100.5; B12 and folate levels are normal   Hypotension - Not on any oral blood pressure medicines (patient states blood pressure has been low at home for several  months) - Added midodrine  2.5 mg TID increased the dose to 5 mg TID on 08/12/23    -Echo unrevealing -TSH normal, cortisol low--cosyntropin  stim test reassuring   Schizophrenia - On mood Uzedy  every 14 days - Patient is still in the hospital on 7/25, she will need to get that injection   Discharge Diagnoses:  Principal Problem:   Transaminitis Active Problems:   Rectal bleeding   Abdominal pain   Nausea   Elevated MCV   Abdominal pain, epigastric   Discharge Instructions:  Allergies as of 08/12/2023       Reactions   Bee Venom Swelling   Requires Epipen   Brexpiprazole  Other (See Comments)   Severe head pain   Erythromycin Shortness Of Breath   Throat closes, flushing    Penicillins Shortness Of Breath   Throat closes, flushing   Abilify [aripiprazole] Other (See Comments)   Unknown    Aripiprazole Other (See Comments)   Unknown   Ciprocin-fluocin-procin [fluocinolone Acetonide] Other (See Comments)   Unknown   Fexofenadine Hcl Other (See Comments)   Unknown    Fluoxetine Hcl Other (See Comments)   Aggression   Gabapentin Other (See Comments)   Dizziness and excessive falling   Haloperidol Decanoate Other (See Comments)   Facial mask symptoms   Iodine Other (See Comments)   Unknown    Lamotrigine Other (See Comments)   Vertigo Increased falls   Lithium  Other (See Comments)   Unknown    Lurasidone Hcl Other (See Comments)   Feels like her head is going to explode Dizziness   Marcaine [bupivacaine Hcl] Other (See Comments)   Unknown    Mirtazapine Other (See Comments)   Dizziness, Blurred vision   Paroxetine Hcl Other (See Comments)   Unknown    Pregabalin Other (See Comments)   Unknown    Quetiapine Fumarate Swelling   Seroquel [quetiapine Fumarate] Other (See Comments)   Unknown    Tape Other (See Comments)   Unknown    Zonisamide Other (See Comments)   Unknown         Medication List     STOP taking these medications    ibuprofen  200 MG  tablet Commonly known as: ADVIL        TAKE these medications    aspirin-sod bicarb-citric acid 325 MG Tbef tablet Commonly known as: ALKA-SELTZER Take 325 mg by mouth every 6 (six) hours as needed (heartburn, indigestion).   benztropine  0.5 MG tablet Commonly known as: COGENTIN  Take 0.5 mg by mouth daily.   bisacodyl  5 MG EC tablet Commonly known as: DULCOLAX Take 5 mg by mouth daily as needed for moderate constipation.   Blood Glucose Monitoring Suppl Devi 1 each by Does not apply route in the morning, at noon, and at bedtime. May substitute to any manufacturer covered by patient's insurance.   BLOOD GLUCOSE TEST STRIPS Strp 1 each by In Vitro route in the morning, at noon, and at bedtime. May substitute to any manufacturer covered by patient's insurance.   busPIRone  7.5 MG tablet Commonly known as: BUSPAR  Take 0.5 tablets (3.75 mg total) by mouth 3 (three) times daily as needed (anxiety).   calcium carbonate 750 MG chewable tablet Commonly known as:  TUMS EX Chew 1 tablet by mouth daily as needed for heartburn.   clotrimazole  1 % vaginal cream Commonly known as: GYNE-LOTRIMIN  Place 1 Applicatorful vaginally at bedtime.   hydrocortisone  25 MG suppository Commonly known as: ANUSOL -HC Place 1 suppository (25 mg total) rectally 2 (two) times daily for 7 days.   Lancet Device Misc 1 each by Does not apply route in the morning, at noon, and at bedtime. May substitute to any manufacturer covered by patient's insurance.   Lancets Misc. Misc 1 each by Does not apply route in the morning, at noon, and at bedtime. May substitute to any manufacturer covered by patient's insurance.   midodrine  5 MG tablet Commonly known as: PROAMATINE  Take 1 tablet (5 mg total) by mouth 3 (three) times daily with meals.   ondansetron  8 MG disintegrating tablet Commonly known as: ZOFRAN -ODT Take 1 tablet (8 mg total) by mouth every 8 (eight) hours as needed for nausea or vomiting.    pantoprazole  40 MG tablet Commonly known as: PROTONIX  Take 1 tablet (40 mg total) by mouth daily.   polyethylene glycol 17 g packet Commonly known as: MIRALAX  / GLYCOLAX  Take 17 g by mouth daily. What changed:  when to take this reasons to take this   potassium chloride  10 MEQ tablet Commonly known as: KLOR-CON  Take 1 tablet (10 mEq total) by mouth daily as needed (pot drops, muscle spasms).   promethazine  12.5 MG tablet Commonly known as: PHENERGAN  Take 12.5 mg by mouth 2 (two) times daily as needed for vomiting or nausea.   simethicone  125 MG chewable tablet Commonly known as: MYLICON Chew 125 mg by mouth every 6 (six) hours as needed for flatulence.   traZODone  50 MG tablet Commonly known as: DESYREL  Take 1 tablet (50 mg total) by mouth at bedtime as needed for sleep.   Uzedy  50 MG/0.14ML Susy Generic drug: risperiDONE  ER Inject 50 mg into the skin every 14 (fourteen) days.        Follow-up Information     Bevely Doffing, FNP. Schedule an appointment as soon as possible for a visit in 1 week(s).   Specialty: Family Medicine Why: Hospital Follow Up Contact information: [address] [place] [address] [phone]         [facility]. Schedule an appointment as soon as possible for a visit in 1 week(s).   Why: Hospital Follow Up Contact information: [address] [place]  [address] [phone]               Allergies  Allergen Reactions   Bee Venom Swelling    Requires Epipen   Brexpiprazole  Other (See Comments)    Severe head pain   Erythromycin Shortness Of Breath    Throat closes, flushing    Penicillins Shortness Of Breath    Throat closes, flushing   Abilify [Aripiprazole] Other (See Comments)    Unknown    Aripiprazole Other (See Comments)    Unknown   Ciprocin-Fluocin-Procin [Fluocinolone Acetonide] Other (See Comments)    Unknown   Fexofenadine Hcl Other (See  Comments)    Unknown    Fluoxetine Hcl Other (See Comments)    Aggression   Gabapentin Other (See Comments)    Dizziness and excessive falling   Haloperidol Decanoate Other (See Comments)    Facial mask symptoms   Iodine Other (See Comments)    Unknown    Lamotrigine Other (See Comments)    Vertigo Increased falls   Lithium  Other (See  Comments)    Unknown    Lurasidone Hcl Other (See Comments)    Feels like her head is going to explode Dizziness   Marcaine [Bupivacaine Hcl] Other (See Comments)    Unknown    Mirtazapine Other (See Comments)    Dizziness, Blurred vision   Paroxetine Hcl Other (See Comments)    Unknown    Pregabalin Other (See Comments)    Unknown    Quetiapine Fumarate Swelling   Seroquel [Quetiapine Fumarate] Other (See Comments)    Unknown    Tape Other (See Comments)    Unknown    Zonisamide Other (See Comments)    Unknown    Allergies as of 08/12/2023       Reactions   Bee Venom Swelling   Requires Epipen   Brexpiprazole  Other (See Comments)   Severe head pain   Erythromycin Shortness Of Breath   Throat closes, flushing    Penicillins Shortness Of Breath   Throat closes, flushing   Abilify [aripiprazole] Other (See Comments)   Unknown    Aripiprazole Other (See Comments)   Unknown   Ciprocin-fluocin-procin [fluocinolone Acetonide] Other (See Comments)   Unknown   Fexofenadine Hcl Other (See Comments)   Unknown    Fluoxetine Hcl Other (See Comments)   Aggression   Gabapentin Other (See Comments)   Dizziness and excessive falling   Haloperidol Decanoate Other (See Comments)   Facial mask symptoms   Iodine Other (See Comments)   Unknown    Lamotrigine Other (See Comments)   Vertigo Increased falls   Lithium  Other (See Comments)   Unknown    Lurasidone Hcl Other (See Comments)   Feels like her head is going to explode Dizziness   Marcaine [bupivacaine Hcl] Other (See Comments)   Unknown    Mirtazapine Other (See Comments)    Dizziness, Blurred vision   Paroxetine Hcl Other (See Comments)   Unknown    Pregabalin Other (See Comments)   Unknown    Quetiapine Fumarate Swelling   Seroquel [quetiapine Fumarate] Other (See Comments)   Unknown    Tape Other (See Comments)   Unknown    Zonisamide Other (See Comments)   Unknown         Medication List     STOP taking these medications    ibuprofen  200 MG tablet Commonly known as: ADVIL        TAKE these medications    aspirin-sod bicarb-citric acid 325 MG Tbef tablet Commonly known as: ALKA-SELTZER Take 325 mg by mouth every 6 (six) hours as needed (heartburn, indigestion).   benztropine  0.5 MG tablet Commonly known as: COGENTIN  Take 0.5 mg by mouth daily.   bisacodyl  5 MG EC tablet Commonly known as: DULCOLAX Take 5 mg by mouth daily as needed for moderate constipation.   Blood Glucose Monitoring Suppl Devi 1 each by Does not apply route in the morning, at noon, and at bedtime. May substitute to any manufacturer covered by patient's insurance.   BLOOD GLUCOSE TEST STRIPS Strp 1 each by In Vitro route in the morning, at noon, and at bedtime. May substitute to any manufacturer covered by patient's insurance.   busPIRone  7.5 MG tablet Commonly known as: BUSPAR  Take 0.5 tablets (3.75 mg total) by mouth 3 (three) times daily as needed (anxiety).   calcium carbonate 750 MG chewable tablet Commonly known as: TUMS EX Chew 1 tablet by mouth daily as needed for heartburn.   clotrimazole  1 % vaginal cream Commonly known as: GYNE-LOTRIMIN  Place 1  Applicatorful vaginally at bedtime.   hydrocortisone  25 MG suppository Commonly known as: ANUSOL -HC Place 1 suppository (25 mg total) rectally 2 (two) times daily for 7 days.   Lancet Device Misc 1 each by Does not apply route in the morning, at noon, and at bedtime. May substitute to any manufacturer covered by patient's insurance.   Lancets Misc. Misc 1 each by Does not apply route in the morning,  at noon, and at bedtime. May substitute to any manufacturer covered by patient's insurance.   midodrine  5 MG tablet Commonly known as: PROAMATINE  Take 1 tablet (5 mg total) by mouth 3 (three) times daily with meals.   ondansetron  8 MG disintegrating tablet Commonly known as: ZOFRAN -ODT Take 1 tablet (8 mg total) by mouth every 8 (eight) hours as needed for nausea or vomiting.   pantoprazole  40 MG tablet Commonly known as: PROTONIX  Take 1 tablet (40 mg total) by mouth daily.   polyethylene glycol 17 g packet Commonly known as: MIRALAX  / GLYCOLAX  Take 17 g by mouth daily. What changed:  when to take this reasons to take this   potassium chloride  10 MEQ tablet Commonly known as: KLOR-CON  Take 1 tablet (10 mEq total) by mouth daily as needed (pot drops, muscle spasms).   promethazine  12.5 MG tablet Commonly known as: PHENERGAN  Take 12.5 mg by mouth 2 (two) times daily as needed for vomiting or nausea.   simethicone  125 MG chewable tablet Commonly known as: MYLICON Chew 125 mg by mouth every 6 (six) hours as needed for flatulence.   traZODone  50 MG tablet Commonly known as: DESYREL  Take 1 tablet (50 mg total) by mouth at bedtime as needed for sleep.   Uzedy  50 MG/0.14ML Susy Generic drug: risperiDONE  ER Inject 50 mg into the skin every 14 (fourteen) days.        Procedures/Studies: NM Myocar Multi W/Spect W/Wall Motion / EF Result Date: 08/11/2023   The study is normal. There are no perfusion defects consistent with prior infarct or current ischemia.  The study is low risk.   No ST deviation was noted.   Left ventricular function is normal. Nuclear stress EF: 61%. The left ventricular ejection fraction is normal (55-65%). End diastolic cavity size is normal.   There is a moderate size mild to moderate intensity inferior defect most intense in the resting images with normal wall motion. Finding is consistent with artifact due to adjacent gut radiotracer uptake.   US  LIVER  DOPPLER Result Date: 08/08/2023 CLINICAL DATA:  212411 Elevated liver enzymes 212411. EXAM: DUPLEX ULTRASOUND OF LIVER TECHNIQUE: Color and duplex Doppler ultrasound was performed to evaluate the hepatic in-flow and out-flow vessels. COMPARISON:  MRI abdomen from 08/05/2023. FINDINGS: Liver: There is increased hepatic echogenicity which reduces the sensitivity of ultrasound for the detection of focal masses. That being said, no focal mass is identified. Normal hepatic contour without nodularity. No intrahepatic biliary ductal dilatation. Main Portal Vein size: 1.25 cm Portal Vein Velocities Main Prox:  34.3 cm/sec Main Mid: 38.1 cm/sec Main Dist:  27.5 cm/sec Right: 36.1 cm/sec Left: 25.1 cm/sec Hepatic Vein Velocities Right:  38.5 cm/sec Middle:  47.1 cm/sec Left:  50.0 cm/sec IVC: Present and patent with normal respiratory phasicity. Hepatic Artery Velocity:  61.0 cm/sec Splenic Vein Velocity:  25.6 cm/sec Spleen: 3.2 cm x 10.2 cm x 11.2 cm with a total volume of 192 cm^3 (411 cm^3 is upper limit normal) Portal Vein Occlusion/Thrombus: No Splenic Vein Occlusion/Thrombus: No Ascites: None Varices: None IMPRESSION: 1. Increased hepatic  echogenicity which is nonspecific but most likely secondary to hepatic steatosis. 2. Unremarkable liver Doppler exam. Electronically Signed   By: Ree Molt M.D.   On: 08/08/2023 10:17   ECHOCARDIOGRAM COMPLETE Result Date: 08/08/2023    ECHOCARDIOGRAM REPORT   Patient Name:   Vicki Thomas Date of Exam: 08/07/2023 Medical Rec #:  [id number]        Height:       67.0 in Accession #:    [id number]       Weight:       172.6 lb Date of Birth:  Oct 08, 1960         BSA:          1.900 m Patient Age:    63 years         BP:           103/49 mmHg Patient Gender: F                HR:           52 bpm. Exam Location:  [facility] Procedure: 2D Echo (Both Spectral and Color Flow Doppler were utilized during            procedure). Indications:    hypotension  History:        Patient has no  prior history of Echocardiogram examinations.  Sonographer:    Tinnie Barefoot RDCS Referring Phys: [phone] JESSICA U VANN IMPRESSIONS  1. Left ventricular ejection fraction, by estimation, is 55 to 60%. The left ventricle has normal function. The left ventricle has no regional wall motion abnormalities. Left ventricular diastolic parameters were normal.  2. Right ventricular systolic function is normal. The right ventricular size is normal. There is mildly elevated pulmonary artery systolic pressure. The estimated right ventricular systolic pressure is 36.9 mmHg.  3. The mitral valve is normal in structure. Trivial mitral valve regurgitation. No evidence of mitral stenosis.  4. The aortic valve is grossly normal. There is mild calcification of the aortic valve. Aortic valve regurgitation is not visualized. No aortic stenosis is present.  5. The inferior vena cava is dilated in size with <50% respiratory variability, suggesting right atrial pressure of 15 mmHg. FINDINGS  Left Ventricle: Left ventricular ejection fraction, by estimation, is 55 to 60%. The left ventricle has normal function. The left ventricle has no regional wall motion abnormalities. The left ventricular internal cavity size was normal in size. There is  no left ventricular hypertrophy. Left ventricular diastolic parameters were normal. Right Ventricle: The right ventricular size is normal. No increase in right ventricular wall thickness. Right ventricular systolic function is normal. There is mildly elevated pulmonary artery systolic pressure. The tricuspid regurgitant velocity is 2.34  m/s, and with an assumed right atrial pressure of 15 mmHg, the estimated right ventricular systolic pressure is 36.9 mmHg. Left Atrium: Left atrial size was normal in size. Right Atrium: Right atrial size was normal in size. Pericardium: There is no evidence of pericardial effusion. Mitral Valve: The mitral valve is normal in structure. Trivial mitral valve  regurgitation. No evidence of mitral valve stenosis. Tricuspid Valve: The tricuspid valve is normal in structure. Tricuspid valve regurgitation is mild . No evidence of tricuspid stenosis. Aortic Valve: The aortic valve is grossly normal. There is mild calcification of the aortic valve. Aortic valve regurgitation is not visualized. No aortic stenosis is present. Aortic valve mean gradient measures 5.6 mmHg. Aortic valve peak gradient measures 12.0 mmHg. Pulmonic Valve: The pulmonic valve was not  well visualized. Pulmonic valve regurgitation is trivial. No evidence of pulmonic stenosis. Aorta: The aortic root is normal in size and structure. Venous: The inferior vena cava is dilated in size with less than 50% respiratory variability, suggesting right atrial pressure of 15 mmHg. IAS/Shunts: No atrial level shunt detected by color flow Doppler.  LEFT VENTRICLE PLAX 2D LVIDd:         4.00 cm Diastology LVIDs:         2.90 cm LV e' medial:    11.90 cm/s LV PW:         0.90 cm LV E/e' medial:  6.7 LV IVS:        0.80 cm LV e' lateral:   15.90 cm/s                        LV E/e' lateral: 5.0  RIGHT VENTRICLE             IVC RV Basal diam:  3.00 cm     IVC diam: 2.60 cm RV S prime:     11.20 cm/s TAPSE (M-mode): 2.2 cm LEFT ATRIUM             Index        RIGHT ATRIUM           Index LA diam:        3.70 cm 1.95 cm/m   RA Area:     15.10 cm LA Vol (A2C):   52.4 ml 27.58 ml/m  RA Volume:   34.90 ml  18.37 ml/m LA Vol (A4C):   46.5 ml 24.48 ml/m LA Biplane Vol: 49.3 ml 25.95 ml/m  AORTIC VALVE AV Vmax:           173.25 cm/s AV Vmean:          111.414 cm/s AV VTI:            0.376 m AV Peak Grad:      12.0 mmHg AV Mean Grad:      5.6 mmHg LVOT Vmax:         147.00 cm/s LVOT Vmean:        85.800 cm/s LVOT VTI:          0.328 m LVOT/AV VTI ratio: 0.87  AORTA Ao Root diam: 2.60 cm Ao Asc diam:  2.80 cm MITRAL VALVE               TRICUSPID VALVE MV Area (PHT): 3.77 cm    TR Peak grad:   21.9 mmHg MV Decel Time: 201 msec     TR Vmax:        234.00 cm/s MV E velocity: 79.50 cm/s MV A velocity: 52.50 cm/s  SHUNTS MV E/A ratio:  1.51        Systemic VTI: 0.33 m Soyla Merck MD Electronically signed by Soyla Merck MD Signature Date/Time: 08/08/2023/8:13:02 AM    Final    MR ABDOMEN MRCP W WO CONTAST Result Date: 08/05/2023 CLINICAL DATA:  Right upper quadrant abdominal pain, common bile duct dilatation identified by ultrasound, status post cholecystectomy EXAM: MRI ABDOMEN WITHOUT AND WITH CONTRAST (INCLUDING MRCP) TECHNIQUE: Multiplanar multisequence MR imaging of the abdomen was performed both before and after the administration of intravenous contrast. Heavily T2-weighted images of the biliary and pancreatic ducts were obtained, and three-dimensional MRCP images were rendered by post processing. CONTRAST:  7.5mL GADAVIST  GADOBUTROL  1 MMOL/ML IV SOLN COMPARISON:  Same-day right upper quadrant ultrasound, CT abdomen  pelvis, 12/17/2011 FINDINGS: Lower chest: No acute abnormality. Hepatobiliary: No focal liver abnormality is seen. Status post cholecystectomy. Intra and extrahepatic biliary ductal dilatation, the common bile duct measuring up to 1.4 cm in caliber. This is unchanged when compared to remote prior CT dated 12/17/2011, in the duct generally tapers to the ampulla without calculus or other obstruction (series 30, image 19). Pancreas: Mild prominence of the pancreatic duct along its length to the ampulla, measuring up to 0.4 cm (series 32, image 21). No inflammatory findings. Spleen: Normal in size without significant abnormality. Adrenals/Urinary Tract: Adrenal glands are unremarkable. Kidneys are normal, without obvious renal calculi, solid lesion, or hydronephrosis. Stomach/Bowel: Roux gastric bypass. No evidence of bowel wall thickening, distention, or inflammatory changes. Large burden of stool throughout the colon. Vascular/Lymphatic: No significant vascular findings are present. No enlarged abdominal lymph nodes.  Other: No abdominal wall hernia or abnormality. No ascites. Musculoskeletal: No acute or significant osseous findings. IMPRESSION: 1. Intra and extrahepatic biliary ductal dilatation, the common bile duct measuring up to 1.4 cm in caliber. This is unchanged when compared to remote prior CT dated 12/17/2011, and the duct generally tapers to the ampulla without calculus or other obstruction. 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 2. Mild prominence of the pancreatic duct along its length to the ampulla, measuring up to 0.4 cm. This is of uncertain significance, without acute inflammatory findings and as above is similar to prior examination dated 2013. 3. Roux gastric bypass. 4. Large burden of stool throughout the colon. Electronically Signed   By: Marolyn JONETTA Jaksch M.D.   On: 08/05/2023 21:34   MR 3D Recon At Scanner Result Date: 08/05/2023 CLINICAL DATA:  Right upper quadrant abdominal pain, common bile duct dilatation identified by ultrasound, status post cholecystectomy EXAM: MRI ABDOMEN WITHOUT AND WITH CONTRAST (INCLUDING MRCP) TECHNIQUE: Multiplanar multisequence MR imaging of the abdomen was performed both before and after the administration of intravenous contrast. Heavily T2-weighted images of the biliary and pancreatic ducts were obtained, and three-dimensional MRCP images were rendered by post processing. CONTRAST:  7.5mL GADAVIST  GADOBUTROL  1 MMOL/ML IV SOLN COMPARISON:  Same-day right upper quadrant ultrasound, CT abdomen pelvis, 12/17/2011 FINDINGS: Lower chest: No acute abnormality. Hepatobiliary: No focal liver abnormality is seen. Status post cholecystectomy. Intra and extrahepatic biliary ductal dilatation, the common bile duct measuring up to 1.4 cm in caliber. This is unchanged when compared to remote prior CT dated 12/17/2011, in the duct generally tapers to the ampulla without calculus or other obstruction (series 30, image 19). Pancreas: Mild prominence of the  pancreatic duct along its length to the ampulla, measuring up to 0.4 cm (series 32, image 21). No inflammatory findings. Spleen: Normal in size without significant abnormality. Adrenals/Urinary Tract: Adrenal glands are unremarkable. Kidneys are normal, without obvious renal calculi, solid lesion, or hydronephrosis. Stomach/Bowel: Roux gastric bypass. No evidence of bowel wall thickening, distention, or inflammatory changes. Large burden of stool throughout the colon. Vascular/Lymphatic: No significant vascular findings are present. No enlarged abdominal lymph nodes. Other: No abdominal wall hernia or abnormality. No ascites. Musculoskeletal: No acute or significant osseous findings. IMPRESSION: 1. Intra and extrahepatic biliary ductal dilatation, the common bile duct measuring up to 1.4 cm in caliber. This is unchanged when compared to remote prior CT dated 12/17/2011, and the duct generally tapers to the ampulla without calculus or other obstruction. 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 2. Mild prominence of the pancreatic duct along its length to the ampulla, measuring up to 0.4 cm. This  is of uncertain significance, without acute inflammatory findings and as above is similar to prior examination dated 2013. 3. Roux gastric bypass. 4. Large burden of stool throughout the colon. Electronically Signed   By: Marolyn JONETTA Jaksch M.D.   On: 08/05/2023 21:34   US  Abdomen Limited RUQ (LIVER/GB) Result Date: 08/05/2023 CLINICAL DATA:  Transaminitis. EXAM: ULTRASOUND ABDOMEN LIMITED RIGHT UPPER QUADRANT COMPARISON:  December 17, 2011. FINDINGS: Gallbladder: Status post cholecystectomy. Common bile duct: Diameter: 16 mm in diameter which most likely is due to post cholecystectomy status, but correlation with liver function tests is recommended to rule out obstruction. Liver: No focal lesion identified. Within normal limits in parenchymal echogenicity. Portal vein is patent on color Doppler  imaging with normal direction of blood flow towards the liver. Other: None. IMPRESSION: Status post cholecystectomy. Common bile duct is dilated at 16 mm which most likely is due to post cholecystectomy status, but correlation with liver function tests is recommended to rule out obstruction Electronically Signed   By: Lynwood Landy Raddle M.D.   On: 08/05/2023 17:53   DG Chest 2 View Result Date: 07/15/2023 CLINICAL DATA:  Chest pain and intermittent dizziness with increased anxiety, nausea, headache and diaphoresis. EXAM: CHEST - 2 VIEW COMPARISON:  April 02, 2015 FINDINGS: The heart size and mediastinal contours are within normal limits. Both lungs are clear. Radiopaque surgical clips are seen within the bilateral upper quadrants. The visualized skeletal structures are unremarkable. IMPRESSION: No active cardiopulmonary disease. Electronically Signed   By: Suzen Dials M.D.   On: 07/15/2023 16:33     Subjective: Pt without any specific complaints, no more chest pain symptoms today.    Discharge Exam: Vitals:   08/12/23 1015 08/12/23 1308  BP: (!) 100/33 (!) 89/49  Pulse: 62 (!) 57  Resp: 18 17  Temp:  98.4 F (36.9 C)  SpO2:  100%   Vitals:   08/11/23 2047 08/12/23 0516 08/12/23 1015 08/12/23 1308  BP: (!) 92/52 (!) 101/45 (!) 100/33 (!) 89/49  Pulse: (!) 54 (!) 49 62 (!) 57  Resp: 19 18 18 17   Temp: 97.7 F (36.5 C) 97.6 F (36.4 C)  98.4 F (36.9 C)  TempSrc: Oral Oral  Oral  SpO2: 95% 97%  100%  Weight:      Height:       General: Pt is alert, awake, not in acute distress Cardiovascular: normal S1/S2 +, no rubs, no gallops Respiratory: CTA bilaterally, no wheezing, no rhonchi Abdominal: Soft, NT, ND, bowel sounds + Extremities: no edema, no cyanosis   The results of significant diagnostics from this hospitalization (including imaging, microbiology, ancillary and laboratory) are listed below for reference.    Microbiology: No results found for this or any previous visit  (from the past 240 hours).   Labs: BNP (last 3 results) No results for input(s): BNP in the last 8760 hours. Basic Metabolic Panel: Recent Labs  Lab 08/06/23 0844 08/07/23 0823 08/08/23 0403 08/09/23 0500 08/10/23 0510 08/11/23 0430 08/12/23 0456  NA  --    < > 135 139 138 138 143  K  --    < > 3.4* 3.8 3.7 4.1 4.3  CL  --    < > 101 107 107 104 105  CO2  --    < > 25 24 25 27 30   GLUCOSE  --    < > 98 94 117* 99 98  BUN  --    < > 10 7* 8 13 13   CREATININE  --    < >  0.66 0.52 0.63 0.64 0.62  CALCIUM  --    < > 8.4* 8.1* 8.1* 8.6* 8.2*  MG 1.9  --   --   --   --   --   --   PHOS 5.0*  --   --   --   --   --   --    < > = values in this interval not displayed.   Liver Function Tests: Recent Labs  Lab 08/07/23 0823 08/08/23 0403 08/10/23 0510 08/11/23 0430 08/12/23 0456  AST 266* 154* 34 23 18  ALT 301* 254* 101* 83* 56*  ALKPHOS 214* 217* 152* 143* 118  BILITOT 1.0 1.0 0.8 0.5 0.4  PROT 5.1* 6.0* 5.0* 5.5* 4.8*  ALBUMIN 2.7* 3.4* 2.7* 2.9* 2.6*   Recent Labs  Lab 08/05/23 1526  LIPASE 100*   No results for input(s): AMMONIA  in the last 168 hours. CBC: Recent Labs  Lab 08/05/23 1526 08/06/23 0844 08/07/23 0823 08/08/23 0403 08/09/23 0500 08/10/23 0510 08/11/23 0430  WBC 6.5   < > 4.7 5.1 4.2 6.0 5.6  NEUTROABS 5.1  --   --   --   --   --   --   HGB 12.3   < > 11.1* 12.1 10.1* 10.6* 10.7*  HCT 37.9   < > 34.3* 38.3 31.7* 32.0* 33.9*  MCV 100.5*   < > 100.0 101.1* 100.0 99.7 101.2*  PLT 223   < > 182 197 166 169 172   < > = values in this interval not displayed.   Cardiac Enzymes: No results for input(s): CKTOTAL, CKMB, CKMBINDEX, TROPONINI in the last 168 hours. BNP: Invalid input(s): POCBNP CBG: Recent Labs  Lab 08/11/23 2050 08/12/23 0004 08/12/23 0406 08/12/23 0707 08/12/23 1118  GLUCAP 124* 104* 99 91 76   D-Dimer No results for input(s): DDIMER in the last 72 hours. Hgb A1c No results for input(s): HGBA1C in the  last 72 hours. Lipid Profile No results for input(s): CHOL, HDL, LDLCALC, TRIG, CHOLHDL, LDLDIRECT in the last 72 hours. Thyroid function studies No results for input(s): TSH, T4TOTAL, T3FREE, THYROIDAB in the last 72 hours.  Invalid input(s): FREET3 Anemia work up No results for input(s): VITAMINB12, FOLATE, FERRITIN, TIBC, IRON, RETICCTPCT in the last 72 hours. Urinalysis    Component Value Date/Time   COLORURINE YELLOW 08/05/2023 1810   APPEARANCEUR CLEAR 08/05/2023 1810   APPEARANCEUR Hazy 07/03/2011 1941   LABSPEC 1.009 08/05/2023 1810   LABSPEC 1.012 07/03/2011 1941   PHURINE 7.0 08/05/2023 1810   GLUCOSEU NEGATIVE 08/05/2023 1810   GLUCOSEU 50 mg/dL 93/91/7986 8058   HGBUR NEGATIVE 08/05/2023 1810   BILIRUBINUR NEGATIVE 08/05/2023 1810   BILIRUBINUR negative 07/15/2023 1212   BILIRUBINUR Negative 07/03/2011 1941   KETONESUR NEGATIVE 08/05/2023 1810   PROTEINUR NEGATIVE 08/05/2023 1810   UROBILINOGEN 0.2 07/15/2023 1212   UROBILINOGEN 0.2 01/13/2014 0237   NITRITE NEGATIVE 08/05/2023 1810   LEUKOCYTESUR NEGATIVE 08/05/2023 1810   LEUKOCYTESUR Negative 07/03/2011 1941   Sepsis Labs Recent Labs  Lab 08/08/23 0403 08/09/23 0500 08/10/23 0510 08/11/23 0430  WBC 5.1 4.2 6.0 5.6   Microbiology No results found for this or any previous visit (from the past 240 hours).  Time coordinating discharge:  42 mins   SIGNED:  Afton Louder, MD  [facility] 08/12/2023, 3:19 PM How to contact the [facility] Attending or Consulting provider 7A - 7P or covering provider during after hours 7P -7A, for this patient?  Check the care team  in [facility] and look for a) attending/consulting [facility] provider listed and b) the [facility] team listed Log into [email or web address] and use [facility] universal password to access. If you do not have the password, please contact the hospital operator. Locate the [facility] provider you are looking for under [facility] and  page to a number that you can be directly reached. If you still have difficulty reaching the provider, please page the [facility] (Director on Call) for the Hospitalists listed on amion for assistance.

## 2023-08-12 NOTE — Plan of Care (Signed)
   Problem: Education: Goal: Knowledge of General Education information will improve Description: Including pain rating scale, medication(s)/side effects and non-pharmacologic comfort measures Outcome: Progressing   Problem: Activity: Goal: Risk for activity intolerance will decrease Outcome: Progressing   Problem: Nutrition: Goal: Adequate nutrition will be maintained Outcome: Progressing   Problem: Coping: Goal: Level of anxiety will decrease Outcome: Progressing

## 2023-08-15 ENCOUNTER — Telehealth: Payer: Self-pay

## 2023-08-15 NOTE — Transitions of Care (Post Inpatient/ED Visit) (Unsigned)
   08/15/2023  Name: Vicki Thomas MRN: [id number] DOB: 12/27/1960  Today's TOC FU Call Status: Today's TOC FU Call Status:: Unsuccessful Call (1st Attempt) Unsuccessful Call (1st Attempt) Date: 08/15/23  Attempted to reach the patient regarding the most recent Inpatient/ED visit.  Follow Up Plan: Additional outreach attempts will be made to reach the patient to complete the Transitions of Care (Post Inpatient/ED visit) call.   Signature  Avelina Essex, CMA (AAMA)  [facility] [phone]

## 2023-08-16 ENCOUNTER — Ambulatory Visit: Payer: Self-pay

## 2023-08-16 NOTE — Telephone Encounter (Signed)
 FYI Only or Action Required?: FYI only for provider.  Patient was last seen in primary care on 08/02/2023 by Bevely Doffing, FNP.  Called Nurse Triage reporting Chest Pain.  Symptoms began yesterday.  Interventions attempted: Rest, hydration, or home remedies.  Symptoms are: gradually worsening.  Triage Disposition: Call EMS 911 Now  Patient/caregiver understands and will follow disposition?: Yes   Copied from CRM #[id number]. Topic: Clinical - Red Word Triage >> Aug 16, 2023  2:46 PM Donee H wrote: Red Word that prompted transfer to Nurse Triage: Patient states chest feels heavy and shoulder blade hurts on left side. She states very weak. She states she was in hospital last week and was discharged Friday. She has been experiencing breaking in sweats at night and shakey. Reason for Disposition  [1] Chest pain lasts > 5 minutes AND [2] age > 75  Answer Assessment - Initial Assessment Questions 1. LOCATION: Where does it hurt?       Chest heavy 2. RADIATION: Does the pain go anywhere else? (e.g., into neck, jaw, arms, back)     Left shoulder 3. ONSET: When did the chest pain begin? (Minutes, hours or days)      Ongoing 4. PATTERN: Does the pain come and go, or has it been constant since it started?  Does it get worse with exertion?      constant 5. DURATION: How long does it last (e.g., seconds, minutes, hours)     Ongoing 6. SEVERITY: How bad is the pain?  (e.g., Scale 1-10; mild, moderate, or severe)     Feels heavy 7. CARDIAC RISK FACTORS: Do you have any history of heart problems or risk factors for heart disease? (e.g., angina, prior heart attack; diabetes, high blood pressure, high cholesterol, smoker, or strong family history of heart disease)     yes      8. CAUSE: What do you think is causing the chest pain?     unsure 9. OTHER SYMPTOMS: Do you have any other symptoms? (e.g., dizziness, nausea, vomiting, sweating, fever, difficulty breathing, cough)        diaphoretic, tremors  Additional info: 11am 113/61, 94.  230pm , 115/53 70, sugar 183 Plans to ask friend for ride if not leaving immediately she then agrees to 911.  Protocols used: Chest Pain-A-AH

## 2023-08-17 ENCOUNTER — Emergency Department (HOSPITAL_COMMUNITY)
Admission: EM | Admit: 2023-08-17 | Discharge: 2023-08-17 | Discharge status: Against Medical Advice | Attending: Emergency Medicine | Admitting: Emergency Medicine

## 2023-08-17 ENCOUNTER — Other Ambulatory Visit: Payer: Self-pay

## 2023-08-17 ENCOUNTER — Emergency Department (HOSPITAL_COMMUNITY)

## 2023-08-17 ENCOUNTER — Encounter (HOSPITAL_COMMUNITY): Payer: Self-pay

## 2023-08-17 DIAGNOSIS — R072 Precordial pain: Secondary | ICD-10-CM | POA: Insufficient documentation

## 2023-08-17 DIAGNOSIS — Z5321 Procedure and treatment not carried out due to patient leaving prior to being seen by health care provider: Secondary | ICD-10-CM | POA: Diagnosis not present

## 2023-08-17 LAB — BASIC METABOLIC PANEL WITH GFR
Anion gap: 10 (ref 5–15)
BUN: 15 mg/dL (ref 8–23)
CO2: 24 mmol/L (ref 22–32)
Calcium: 8.7 mg/dL — ABNORMAL LOW (ref 8.9–10.3)
Chloride: 105 mmol/L (ref 98–111)
Creatinine, Ser: 0.8 mg/dL (ref 0.44–1.00)
GFR, Estimated: >60 mL/min (ref >60)
Glucose, Bld: 108 mg/dL — ABNORMAL HIGH (ref 70–99)
Potassium: 4.1 mmol/L (ref 3.5–5.1)
Sodium: 139 mmol/L (ref 135–145)

## 2023-08-17 LAB — CBC
HCT: 38.7 % (ref 36.0–46.0)
Hemoglobin: 12.5 g/dL (ref 12.0–15.0)
MCH: 31.8 pg (ref 26.0–34.0)
MCHC: 32.3 g/dL (ref 30.0–36.0)
MCV: 98.5 fL (ref 80.0–100.0)
Platelets: 291 K/uL (ref 150–400)
RBC: 3.93 MIL/uL (ref 3.87–5.11)
RDW: 12.2 % (ref 11.5–15.5)
WBC: 6.3 K/uL (ref 4.0–10.5)
nRBC: 0 % (ref 0.0–0.2)

## 2023-08-17 LAB — TROPONIN I (HIGH SENSITIVITY): Troponin I (High Sensitivity): 4 ng/L (ref <18)

## 2023-08-17 NOTE — ED Notes (Signed)
 Pt denies hx cardiac background.

## 2023-08-17 NOTE — ED Notes (Signed)
 Patient is upset about the wait time. I asked her what she's her for she said chest pains but she left anyway. OTF

## 2023-08-17 NOTE — ED Triage Notes (Signed)
 Pt states she was d/c Friday for the same symptoms but pt states she has been hurting since then.   Pt states she is having midsternum pain that radiates to back. Pt says she gets sweaty and feel like she is going to pass out.   Pt states when her sugar is low she loses her sight and needs to eat things to bring her sugar up.  Hx Hypotension (midodrine ) and Hypoglycemia(tracking with glucometer)

## 2023-08-17 NOTE — Telephone Encounter (Signed)
 Patient went to ER>

## 2023-08-17 NOTE — ED Notes (Signed)
 Pt refused repeat labs. Pt stated she has been here all night and if she does not get a room she will leave. Pt informed the ED is very busy and we are trying to see every pt as soon as possible. Pt called son and walked out of the ED.

## 2023-08-19 ENCOUNTER — Ambulatory Visit (INDEPENDENT_AMBULATORY_CARE_PROVIDER_SITE_OTHER): Payer: Self-pay | Admitting: Family Medicine

## 2023-08-19 ENCOUNTER — Encounter: Payer: Self-pay | Admitting: Family Medicine

## 2023-08-19 VITALS — BP 101/66 | HR 65 | Resp 16 | Ht 67.0 in | Wt 171.0 lb

## 2023-08-19 DIAGNOSIS — R001 Bradycardia, unspecified: Secondary | ICD-10-CM

## 2023-08-19 DIAGNOSIS — K649 Unspecified hemorrhoids: Secondary | ICD-10-CM

## 2023-08-19 DIAGNOSIS — R1013 Epigastric pain: Secondary | ICD-10-CM

## 2023-08-19 DIAGNOSIS — I950 Idiopathic hypotension: Secondary | ICD-10-CM | POA: Diagnosis not present

## 2023-08-19 DIAGNOSIS — Z09 Encounter for follow-up examination after completed treatment for conditions other than malignant neoplasm: Secondary | ICD-10-CM | POA: Diagnosis not present

## 2023-08-19 DIAGNOSIS — R7401 Elevation of levels of liver transaminase levels: Secondary | ICD-10-CM

## 2023-08-19 DIAGNOSIS — E162 Hypoglycemia, unspecified: Secondary | ICD-10-CM

## 2023-08-19 DIAGNOSIS — F319 Bipolar disorder, unspecified: Secondary | ICD-10-CM

## 2023-08-19 NOTE — Patient Instructions (Signed)
 Follow-up with PCP as before.  We will reach out for a sooner appointment with cardiology,.  We will reach out for an appointment with the GI specialist.  You are being referred to endocrinology for recurrent hypoglycemia.  You are being referred to Dr. Kallie resymptomatic bleeding ulcers hemorrhoids.  Hepatic panel today.  No new medications are prescribed   Thanks for choosing [facility], we consider it a privelige to serve you.

## 2023-08-20 ENCOUNTER — Ambulatory Visit: Payer: Self-pay | Admitting: Family Medicine

## 2023-08-20 LAB — HEPATIC FUNCTION PANEL
ALT: 26 IU/L (ref 0–32)
AST: 26 IU/L (ref 0–40)
Albumin: 4.3 g/dL (ref 3.9–4.9)
Alkaline Phosphatase: 147 IU/L — ABNORMAL HIGH (ref 44–121)
Bilirubin Total: <0.2 mg/dL (ref 0.0–1.2)
Bilirubin, Direct: 0.11 mg/dL (ref 0.00–0.40)
Total Protein: 6.3 g/dL (ref 6.0–8.5)

## 2023-08-21 ENCOUNTER — Encounter: Payer: Self-pay | Admitting: Family Medicine

## 2023-08-21 DIAGNOSIS — I959 Hypotension, unspecified: Secondary | ICD-10-CM | POA: Insufficient documentation

## 2023-08-21 DIAGNOSIS — E162 Hypoglycemia, unspecified: Secondary | ICD-10-CM | POA: Insufficient documentation

## 2023-08-21 DIAGNOSIS — K649 Unspecified hemorrhoids: Secondary | ICD-10-CM | POA: Insufficient documentation

## 2023-08-21 DIAGNOSIS — R001 Bradycardia, unspecified: Secondary | ICD-10-CM | POA: Insufficient documentation

## 2023-08-21 DIAGNOSIS — Z09 Encounter for follow-up examination after completed treatment for conditions other than malignant neoplasm: Secondary | ICD-10-CM | POA: Insufficient documentation

## 2023-08-21 NOTE — Assessment & Plan Note (Signed)
 Both internal and external reported on recent colonoscopy, pt reports daily bleeding and discomfort, wants surgical  intervention refer Gen surg

## 2023-08-21 NOTE — Assessment & Plan Note (Signed)
 Persistent with nausea , needs GI follow up

## 2023-08-21 NOTE — Assessment & Plan Note (Signed)
 Symptomatic recurrent hypoglycemia s/p bariatric surgery and multifactorial refer Endo for assistance with management

## 2023-08-21 NOTE — Assessment & Plan Note (Signed)
 Re elaute labs poost hospitalization

## 2023-08-21 NOTE — Assessment & Plan Note (Signed)
 Normotensive at visit, on review Cardiology appt appropriate will reassure pt

## 2023-08-21 NOTE — Progress Notes (Signed)
   Vicki Thomas     MRN: [id number]      DOB: 02-Sep-1960  Chief Complaint  Patient presents with   Hospitalization Follow-up    Was in hospital from 07/11-07/18 for abdominal pain. Pt has been feeling dizzy and weak since leaving the hospital, is taking meclizine  but not helping.     HPI Vicki Thomas is here for follow up of hospitalization from 7/11 to 08/12/2023 and an ED visit on 7/23 with similar symptoms that she had been hospitalized for Patient in for follow up of recent hospitalization. Discharge summary, and laboratory and radiology data are reviewed, and any questions or concerns  are discussed. Specific issues requiring follow up are specifically addressed.   ROS See HPI  Denies recent fever or chills. Major concern expressed is bleeding hemorrhoids, wants referral for definitive management Needs hospital follo up visits scheduled with both cardiology and GI, started wearing zio patch 2 days sgo C/O symptomatic  hypoglycemia s/p  bypass surgery and  will benefit from Endo input  Reports significant agoraphobia and mental health illness, has just established with a remote Therapist and will start Psychiatric assessment and management in the near future through this resource is the hope. Wants and needs mental health help, is not suicidal or homicidal and reports good family support, she lives alone  Tolerating current dose of midodrine  , remains relatively hypotensive , however med adjustment not indicated  PE  BP 101/66   Pulse 65   Resp 16   Ht 5' 7 (1.702 m)   Wt 171 lb 0.6 oz (77.6 kg)   SpO2 96%   BMI 26.79 kg/m   Patient alert and oriented and in no cardiopulmonary distress.  HEENT: No facial asymmetry, EOMI,     Neck supple .  Chest: Clear to auscultation bilaterally.  CVS: S1, S2 no murmurs, no S3.Regular rate.  ABD: Soft non tender.   Ext: No edema  MS: Adequate ROM spine, shoulders, hips and knees.  Skin: Intact, no ulcerations or rash  noted.  Psych: Good eye contact, normal affect. Memory intact  anxious not depressed appearing.  CNS: CN 2-12 intact, power,  normal throughout.no focal deficits noted.   Assessment & Plan  Hospital discharge follow-up Patient in for follow up of recent hospitalization. Discharge summary, and laboratory and radiology data are reviewed, and any questions or concerns  are discussed. Specific issues requiring follow up are specifically addressed.   Bradycardia Normotensive at visit, on review Cardiology appt appropriate will reassure pt  Hypotension Adequate dose midodrine  , no med adjustment, f/u with cardiology  Hemorrhoids Both internal and external reported on recent colonoscopy, pt reports daily bleeding and discomfort, wants surgical  intervention refer Gen surg  Hypoglycemia Symptomatic recurrent hypoglycemia s/p bariatric surgery and multifactorial refer Endo for assistance with management  Transaminitis Re elaute labs poost hospitalization  Abdominal pain, epigastric Persistent with nausea , needs GI follow up  Bipolar I disorder (HCC) Mental health needs are being addressed per pt report she acknowledges significant mental illness and desires teatment , currnetly engaged wit a Therapist

## 2023-08-21 NOTE — Assessment & Plan Note (Signed)
Patient in for follow up of recent hospitalization. Discharge summary, and laboratory and radiology data are reviewed, and any questions or concerns  are discussed. Specific issues requiring follow up are specifically addressed.  

## 2023-08-21 NOTE — Assessment & Plan Note (Signed)
 Adequate dose midodrine  , no med adjustment, f/u with cardiology

## 2023-08-21 NOTE — Assessment & Plan Note (Signed)
 Mental health needs are being addressed per pt report she acknowledges significant mental illness and desires teatment , currnetly engaged wit a Therapist

## 2023-08-22 ENCOUNTER — Emergency Department (HOSPITAL_COMMUNITY)
Admission: EM | Admit: 2023-08-22 | Discharge: 2023-08-22 | Disposition: A | Discharge status: Home / Self Care | Attending: Emergency Medicine | Admitting: Emergency Medicine

## 2023-08-22 ENCOUNTER — Encounter (HOSPITAL_COMMUNITY): Payer: Self-pay | Admitting: Emergency Medicine

## 2023-08-22 ENCOUNTER — Other Ambulatory Visit: Payer: Self-pay

## 2023-08-22 DIAGNOSIS — R110 Nausea: Secondary | ICD-10-CM | POA: Insufficient documentation

## 2023-08-22 DIAGNOSIS — R1011 Right upper quadrant pain: Secondary | ICD-10-CM | POA: Insufficient documentation

## 2023-08-22 DIAGNOSIS — R1013 Epigastric pain: Secondary | ICD-10-CM | POA: Diagnosis present

## 2023-08-22 LAB — COMPREHENSIVE METABOLIC PANEL WITH GFR
ALT: 67 U/L — ABNORMAL HIGH (ref 0–44)
AST: 148 U/L — ABNORMAL HIGH (ref 15–41)
Albumin: 3.5 g/dL (ref 3.5–5.0)
Alkaline Phosphatase: 152 U/L — ABNORMAL HIGH (ref 38–126)
Anion gap: 9 (ref 5–15)
BUN: 20 mg/dL (ref 8–23)
CO2: 21 mmol/L — ABNORMAL LOW (ref 22–32)
Calcium: 8.7 mg/dL — ABNORMAL LOW (ref 8.9–10.3)
Chloride: 107 mmol/L (ref 98–111)
Creatinine, Ser: 0.68 mg/dL (ref 0.44–1.00)
GFR, Estimated: >60 mL/min (ref >60)
Glucose, Bld: 112 mg/dL — ABNORMAL HIGH (ref 70–99)
Potassium: 4.1 mmol/L (ref 3.5–5.1)
Sodium: 137 mmol/L (ref 135–145)
Total Bilirubin: 0.8 mg/dL (ref 0.0–1.2)
Total Protein: 6.2 g/dL — ABNORMAL LOW (ref 6.5–8.1)

## 2023-08-22 LAB — URINALYSIS, ROUTINE W REFLEX MICROSCOPIC
Bilirubin Urine: NEGATIVE
Glucose, UA: NEGATIVE mg/dL
Hgb urine dipstick: NEGATIVE
Ketones, ur: NEGATIVE mg/dL
Leukocytes,Ua: NEGATIVE
Nitrite: NEGATIVE
Protein, ur: NEGATIVE mg/dL
Specific Gravity, Urine: 1.016 (ref 1.005–1.030)
pH: 5 (ref 5.0–8.0)

## 2023-08-22 LAB — CBC
HCT: 39.4 % (ref 36.0–46.0)
Hemoglobin: 13.1 g/dL (ref 12.0–15.0)
MCH: 32.2 pg (ref 26.0–34.0)
MCHC: 33.2 g/dL (ref 30.0–36.0)
MCV: 96.8 fL (ref 80.0–100.0)
Platelets: 299 K/uL (ref 150–400)
RBC: 4.07 MIL/uL (ref 3.87–5.11)
RDW: 12.1 % (ref 11.5–15.5)
WBC: 6.1 K/uL (ref 4.0–10.5)
nRBC: 0 % (ref 0.0–0.2)

## 2023-08-22 LAB — LIPASE, BLOOD: Lipase: 37 U/L (ref 11–51)

## 2023-08-22 MED ORDER — ALUM & MAG HYDROXIDE-SIMETH 200-200-20 MG/5ML PO SUSP
30.0000 mL | Freq: Once | ORAL | Status: AC
  Administered 2023-08-22: 30 mL via ORAL
  Filled 2023-08-22: qty 30

## 2023-08-22 MED ORDER — ONDANSETRON 4 MG PO TBDP
4.0000 mg | ORAL_TABLET | Freq: Once | ORAL | Status: AC
  Administered 2023-08-22: 4 mg via ORAL
  Filled 2023-08-22: qty 1

## 2023-08-22 MED ORDER — ALUMINUM & MAGNESIUM HYDROXIDE 200-200 MG/5ML PO SUSP: 15.0000 mL | Freq: Three times a day (TID) | ORAL | 0 refills | Status: AC | PRN

## 2023-08-22 NOTE — Discharge Instructions (Addendum)
 Was a pleasure taking care of you today.  You were evaluated for burning in your stomach.  You were given a GI cocktail which has improved your symptoms.  Follow-up with the GI doctor and continue your medications at home.  Have given you the phone number for the GI doctor for you to follow-up with.  Please call their office tomorrow to schedule appointment.  Come back to the ER for any new or worsening symptoms.

## 2023-08-22 NOTE — ED Triage Notes (Signed)
 Abd pain that has been going on for a while.  Admitted to the hospital for 7 days recently for the same thing.

## 2023-08-22 NOTE — ED Provider Notes (Signed)
 [place] EMERGENCY DEPARTMENT AT [facility] Provider Note   CSN: [id number] Arrival date & time: 08/22/23  1258     Patient presents with: Abdominal Pain   Vicki Thomas is a 63 y.o. female.  History of PTSD, bipolar disorder, schizophrenia.  Presents the ER today complaining of abdominal pain in the epigastric and right upper quadrant area.  Is described as sharp and burning.  She has some associated nausea but no vomiting, no diarrhea.  No fever or chills.  This is same as the pain she felt on 7/11 when she was admitted to the hospital.  He has newly elevated liver function tests that day.  She ultimately had MRCP, endoscopy and colonoscopy and was discharged.  She has been compliant with her Protonix  as prescribed, and follow-up with her PCP several days ago and had been doing fairly well, awaiting outpatient GI and endocrine follow-up.  She states the symptoms started suddenly this morning.  She tried eating yogurt as sometimes dairy will relieve her symptoms and this did not help.  Denies chest pain or shortness of breath.  Denies fever or chills.    Abdominal Pain      Prior to Admission medications   Medication Sig Start Date End Date Taking? Authorizing Provider  aluminum -magnesium  hydroxide 200-200 MG/5ML suspension Take 15 mLs by mouth every 8 (eight) hours as needed for indigestion. 08/22/23  Yes Salik Grewell A, PA-C  benztropine  (COGENTIN ) 0.5 MG tablet Take 0.5 mg by mouth daily. 07/13/23   [provider]  bisacodyl  (DULCOLAX) 5 MG EC tablet Take 5 mg by mouth daily as needed for moderate constipation.    [provider]  Blood Glucose Monitoring Suppl DEVI 1 each by Does not apply route in the morning, at noon, and at bedtime. May substitute to any manufacturer covered by patient's insurance. 08/12/23   Johnson, Clanford L, MD  busPIRone  (BUSPAR ) 7.5 MG tablet Take 0.5 tablets (3.75 mg total) by mouth 3 (three) times daily as needed  (anxiety). 08/12/23   Johnson, Clanford L, MD  calcium carbonate (TUMS EX) 750 MG chewable tablet Chew 1 tablet by mouth daily as needed for heartburn.    [provider]  clotrimazole  (GYNE-LOTRIMIN ) 1 % vaginal cream Place 1 Applicatorful vaginally at bedtime. 05/17/23   Jadapalle, Sree, MD  Glucose Blood (BLOOD GLUCOSE TEST STRIPS) STRP 1 each by In Vitro route in the morning, at noon, and at bedtime. May substitute to any manufacturer covered by patient's insurance. 08/12/23 09/14/23  Vicci Afton CROME, MD  Lancet Device MISC 1 each by Does not apply route in the morning, at noon, and at bedtime. May substitute to any manufacturer covered by patient's insurance. 08/12/23 09/11/23  Vicci Afton CROME, MD  Lancets Misc. MISC 1 each by Does not apply route in the morning, at noon, and at bedtime. May substitute to any manufacturer covered by patient's insurance. 08/12/23 09/11/23  Vicci Afton CROME, MD  meclizine  (ANTIVERT ) 25 MG tablet Take 1 tablet (25 mg total) by mouth 3 (three) times daily as needed for dizziness. 08/19/23   Antonetta Rollene BRAVO, MD  midodrine  (PROAMATINE ) 5 MG tablet Take 1 tablet (5 mg total) by mouth 3 (three) times daily with meals. 08/12/23   Johnson, Clanford L, MD  ondansetron  (ZOFRAN -ODT) 8 MG disintegrating tablet Take 1 tablet (8 mg total) by mouth every 8 (eight) hours as needed for nausea or vomiting. 06/16/23   Kennedy Charmaine CROME, NP  pantoprazole  (PROTONIX ) 40 MG tablet  Take 1 tablet (40 mg total) by mouth daily. 08/12/23   Johnson, Clanford L, MD  polyethylene glycol (MIRALAX  / GLYCOLAX ) 17 g packet Take 17 g by mouth daily. 08/12/23   Johnson, Clanford L, MD  potassium chloride  (KLOR-CON ) 10 MEQ tablet Take 1 tablet (10 mEq total) by mouth daily as needed (pot drops, muscle spasms). 08/12/23   Johnson, Clanford L, MD  promethazine  (PHENERGAN ) 12.5 MG tablet Take 12.5 mg by mouth 2 (two) times daily as needed for vomiting or nausea. Reports taking 3 times daily at  visit on 08/19/2023 06/21/23   [provider]  simethicone  (MYLICON) 125 MG chewable tablet Chew 125 mg by mouth every 6 (six) hours as needed for flatulence.    [provider]  traZODone  (DESYREL ) 50 MG tablet Take 1 tablet (50 mg total) by mouth at bedtime as needed for sleep. 08/12/23   Johnson, Clanford L, MD  UZEDY  50 MG/0.14ML SUSY Inject 50 mg into the skin every 14 (fourteen) days. 07/26/23   [provider]    Allergies: Bee venom, Brexpiprazole , Erythromycin, Penicillins, Abilify [aripiprazole], Aripiprazole, Ciprocin-fluocin-procin [fluocinolone acetonide], Fexofenadine hcl, Fluoxetine hcl, Gabapentin, Haloperidol decanoate, Iodine, Lamotrigine, Lithium , Lurasidone hcl, Marcaine [bupivacaine hcl], Mirtazapine, Paroxetine hcl, Pregabalin, Quetiapine fumarate, Seroquel [quetiapine fumarate], Tape, and Zonisamide    Review of Systems  Gastrointestinal:  Positive for abdominal pain.    Updated Vital Signs BP 93/68   Pulse (!) 54   Temp 97.7 F (36.5 C) (Oral)   Resp 20   Ht 5' 7 (1.702 m)   Wt 77.6 kg   SpO2 96%   BMI 26.78 kg/m   Physical Exam Vitals and nursing note reviewed.  Constitutional:      General: She is not in acute distress.    Appearance: She is well-developed.  HENT:     Head: Normocephalic and atraumatic.  Eyes:     Extraocular Movements: Extraocular movements intact.     Conjunctiva/sclera: Conjunctivae normal.     Pupils: Pupils are equal, round, and reactive to light.  Cardiovascular:     Rate and Rhythm: Normal rate and regular rhythm.     Heart sounds: No murmur heard. Pulmonary:     Effort: Pulmonary effort is normal. No respiratory distress.     Breath sounds: Normal breath sounds.  Abdominal:     Palpations: Abdomen is soft.     Tenderness: There is abdominal tenderness in the epigastric area. There is no guarding or rebound.  Musculoskeletal:        General: No swelling.     Cervical back: Neck supple.  Skin:     General: Skin is warm and dry.     Capillary Refill: Capillary refill takes less than 2 seconds.  Neurological:     General: No focal deficit present.     Mental Status: She is alert and oriented to person, place, and time.  Psychiatric:        Mood and Affect: Mood normal.     (all labs ordered are listed, but only abnormal results are displayed) Labs Reviewed  COMPREHENSIVE METABOLIC PANEL WITH GFR - Abnormal; Notable for the following components:      Result Value   CO2 21 (*)    Glucose, Bld 112 (*)    Calcium 8.7 (*)    Total Protein 6.2 (*)    AST 148 (*)    ALT 67 (*)    Alkaline Phosphatase 152 (*)    All other components within normal limits  LIPASE, BLOOD  CBC  URINALYSIS, ROUTINE W REFLEX MICROSCOPIC    EKG: None  Radiology: No results found.   Procedures   Medications Ordered in the ED  alum & mag hydroxide-simeth (MAALOX/MYLANTA) 200-200-20 MG/5ML suspension 30 mL (30 mLs Oral Given 08/22/23 1628)  ondansetron  (ZOFRAN -ODT) disintegrating tablet 4 mg (4 mg Oral Given 08/22/23 1628)    Clinical Course as of 08/22/23 1718  Mon Aug 22, 2023  1708 Here with burning epigastric pain, feels like when she was in the hospital previously, she does have transaminitis but improved from prior admission.  She states in the hospital GI cocktail was helping her if she was given GI cocktail today and symptoms have resolved, will have her follow-up with GI as outpatient. [CB]    Clinical Course User Index [CB] Suellen Sherran LABOR, PA-C                                 Medical Decision Making Amount and/or Complexity of Data Reviewed External Data Reviewed: labs, radiology and notes. Labs: ordered.    Details: UA negative, CMP with AST 148, ALT 67 and alk phos 152 similar to previous, improved from initial admission earlier this month CBC with no leukocytosis or anemia, lipase is normal  Risk OTC drugs. Prescription drug management.   Differential diagnosis includes  but not limited to gastritis, gastroenteritis, hepatitis, liver  ED course: Patient here with burning epigastric pain that started this morning, she tried eating some yogurt which sometimes helps it did not improve.  Symptoms resolved after GI cocktail.  Patient had very tensive workup in the hospital including endoscopy, colonoscopy, MRCP, right upper quadrant ultrasound that was all overall reassuring.  She has been compliant with her home meds.  Given that she is feeling better after the GI cocktail will advise close outpatient follow-up and strict return precautions.     Final diagnoses:  Epigastric pain    ED Discharge Orders          Ordered    aluminum -magnesium  hydroxide 200-200 MG/5ML suspension  Every 8 hours PRN        08/22/23 1714               Suellen Sherran LABOR, PA-C 08/22/23 1718    Suzette Pac, MD 08/24/23 1216

## 2023-08-24 ENCOUNTER — Encounter

## 2023-09-05 NOTE — Progress Notes (Signed)
 Referring Provider: Bevely Doffing, FNP Primary Care Physician:  Bevely Doffing, FNP Primary GI Physician: Dr. Cindie  Chief Complaint  Patient presents with   Abdominal Pain    Stomach pains and gas    HPI:   Vicki Thomas is a 63 y.o. 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   Patient was admitted to the hospital 08/05/2023 - 08/12/2023 after presenting with upper abdominal pain, nausea, and found to have elevated liver enzymes.  Regarding her LFT elevation, she was found to have acute hepatocellular pattern of LFT elevation with LFTs quickly trending down, suspected to be secondary to ischemic hepatopathy in the setting of documented hypotension. Max with AST 1085, ALT 611, alk phos 295. At the time of discharge AST 18, ALT 56, alk phos 118.  Synthetic function remained preserved and she had no hepatic encephalopathy.  Choledocholithiasis ruled out with MRCP.  Acute viral hepatitis panel, ANA, AMA, ASMA negative.  Iron and iron saturation elevated, but ferritin within normal limits.  HSV 1 and 2 negative, varicella-zoster negative.  EBV IgG positive, but IgM negative..  Alpha-1 antitrypsin serum was low at 86.  Regarding her epigastric and RUQ abdominal pain, this was a chronic problem for her, but she reported worsening prior to admission in reported symptoms worsen postprandially.  EGD completed 7/15 showing normal esophagus, gastric bypass with a 4 cm pouch, intact staple line, gastrojejunal anastomosis with erosion.  Normal examined jejunum.  Also noted chronic intermittent rectal bleeding in the setting of known hemorrhoids and chronic constipation.  Previously scheduled for outpatient colonoscopy, but failed to follow instructions.  Patient requested colonoscopy inpatient as she did not have good social support.   Colonoscopy 7/15 with hemorrhoids on perianal exam, eight 2-5 mm polyps in the transverse colon resected and retrieved, one 3 mm polyp in the rectum resected and retrieved.  Reported bleeding was secondary to hemorrhoids.  She was started on anusol  suppositories every 12 hours x 10 days.  Surgical pathology showed tubular adenomas.  Recommended 3-year surveillance.     Patient was seen in the ER 08/22/2023 for epigastric and RUQ abdominal pain described as sharp and burning.  Associated nausea without vomiting.  She was given GI cocktail and symptoms resolved.   Today:  Elevated LFTs: - LFTs normalized aside from alk phos 147 on 08/19/23. - 08/22/23 AST 148, ALT 67, alk phos 152. This was in the setting of recurrent epigastric abdominal pain.  -   ETOH: None. Drank some when she was a young adult. Never drank heavily.   OTC Meds/supplements:  1 tylenol  1-2 times a day.  No NSAIDs.  No herbals.    Epigastric/RUQ abdominal pain: Last couple days has had worsening symptoms again.   She has changed her diet. Eats tuna, tuna salad, boiled chicken, low fat foods, vegetables, fruit, cottage cheese. Doesn't matter what she eats. Pain is present before or after eating, but does worse postprandially. Drinking mylanta and this is helping some. Pain worsens about 20 minutes after eating. Has gained weight recently.   Taking Protonix  40 mg once a day.  Not taking Carafate  as she wasn't discharge on this, but states it helped significantly when she was admitted.   Having trouble keeping her sugar up as she is eating small amounts of food.   Nausea and vomiting. Vomiting 2-3 times a week. Sometimes it is due to reflux  after eating. Heartburn is daily.  Taking zofran  2-3 times a day.   Will be starting new medication next month for schizophrenia symptoms. Can't remember it's name. States she will call back with this.    Constipation:  Taking MiraLAX  daily and taking exlax x 3. Can't take Linzess   because it causes diarrhea.    Rectal bleeding: Continues with rectal bleeding when she has a BM. Suppositories helped but ran out. Bleeding stopped for a while after using suppositories.  Has appt with Kallie on 8/19.    Past Medical History:  Diagnosis Date   Anxiety    Arthritis    Bipolar 1 disorder (HCC)    Chronic back pain    Chronic fatigue    Chronic headaches    Chronic pain    Fibromyalgia    Hiatal hernia    History of borderline personality disorder    Interstitial cystitis    Lymphedema    Panic disorder with agoraphobia    PTSD (post-traumatic stress disorder)     Past Surgical History:  Procedure Laterality Date   ABDOMINAL HYSTERECTOMY     APPENDECTOMY     BARIATRIC SURGERY     CHOLECYSTECTOMY     COLONOSCOPY N/A 08/09/2023   Procedure: COLONOSCOPY;  Surgeon: Eartha Angelia Sieving, MD;  Location: [facility];  Service: Gastroenterology;  Laterality: N/A;   ESOPHAGOGASTRODUODENOSCOPY N/A 08/09/2023   Procedure: EGD (ESOPHAGOGASTRODUODENOSCOPY);  Surgeon: Eartha Angelia, Sieving, MD;  Location: [facility];  Service: Gastroenterology;  Laterality: N/A;   ESOPHAGUS SURGERY     FOOT SURGERY     HEMORRHOID SURGERY     HERNIA REPAIR     TONSILLECTOMY      Current Outpatient Medications  Medication Sig Dispense Refill   aluminum -magnesium  hydroxide 200-200 MG/5ML suspension Take 15 mLs by mouth every 8 (eight) hours as needed for indigestion. 355 mL 0   benztropine  (COGENTIN ) 0.5 MG tablet Take 0.5 mg by mouth daily.     bisacodyl  (DULCOLAX) 5 MG EC tablet Take 5 mg by mouth daily as needed for moderate constipation.     Blood Glucose Monitoring Suppl DEVI 1 each by Does not apply route in the morning, at noon, and at bedtime. May substitute to any manufacturer covered by patient's insurance. 1 each 0   busPIRone  (BUSPAR ) 7.5 MG tablet Take 0.5 tablets (3.75 mg total) by mouth 3 (three) times daily as needed (anxiety).     calcium carbonate (TUMS  EX) 750 MG chewable tablet Chew 1 tablet by mouth daily as needed for heartburn.     clotrimazole  (GYNE-LOTRIMIN ) 1 % vaginal cream Place 1 Applicatorful vaginally at bedtime. 45 g 0   Glucose Blood (BLOOD GLUCOSE TEST STRIPS) STRP 1 each by In Vitro route in the morning, at noon, and at bedtime. May substitute to any manufacturer covered by patient's insurance. 100 strip 0   hydrocortisone  (ANUSOL -HC) 25 MG suppository Place 1 suppository (25 mg total) rectally every 12 (twelve) hours. 24 suppository 1   Lancet Device MISC 1 each by Does not apply route in the morning, at noon, and at bedtime. May substitute to any manufacturer covered by patient's insurance. 1 each 0   Lancets Misc. MISC 1 each by Does not apply route in the morning, at noon, and at bedtime. May substitute to any manufacturer covered by patient's insurance. 100 each 0   lubiprostone  (AMITIZA ) 8 MCG capsule Take 1 capsule (8 mcg total) by mouth 2 (two) times daily with a meal. 60  capsule 3   meclizine  (ANTIVERT ) 25 MG tablet Take 1 tablet (25 mg total) by mouth 3 (three) times daily as needed for dizziness.     midodrine  (PROAMATINE ) 5 MG tablet Take 1 tablet (5 mg total) by mouth 3 (three) times daily with meals. 90 tablet 1   ondansetron  (ZOFRAN -ODT) 8 MG disintegrating tablet Take 1 tablet (8 mg total) by mouth every 8 (eight) hours as needed for nausea or vomiting. 20 tablet 2   polyethylene glycol (MIRALAX  / GLYCOLAX ) 17 g packet Take 17 g by mouth daily. 30 each 1   potassium chloride  (KLOR-CON ) 10 MEQ tablet Take 1 tablet (10 mEq total) by mouth daily as needed (pot drops, muscle spasms).     simethicone  (MYLICON) 125 MG chewable tablet Chew 125 mg by mouth every 6 (six) hours as needed for flatulence.     sucralfate  (CARAFATE ) 1 g tablet Take 1 tablet (1 g total) by mouth 4 (four) times daily -  with meals and at bedtime. Dissolve tablet in 1-2 tablespoons of water, then drink slurry. 90 tablet 2   traZODone  (DESYREL ) 50 MG  tablet Take 1 tablet (50 mg total) by mouth at bedtime as needed for sleep.     UZEDY  50 MG/0.14ML SUSY Inject 50 mg into the skin every 14 (fourteen) days.     pantoprazole  (PROTONIX ) 40 MG tablet Take 1 tablet (40 mg total) by mouth 2 (two) times daily before a meal. 60 tablet 3   promethazine  (PHENERGAN ) 12.5 MG tablet Take 12.5 mg by mouth 2 (two) times daily as needed for vomiting or nausea. Reports taking 3 times daily at visit on 08/19/2023 (Patient not taking: Reported on 09/07/2023)     No current facility-administered medications for this visit.    Allergies as of 09/07/2023 - Review Complete 09/07/2023  Allergen Reaction Noted   Bee venom Swelling 02/15/2011   Brexpiprazole  Other (See Comments) 03/01/2014   Erythromycin Shortness Of Breath 02/15/2011   Penicillins Shortness Of Breath 02/15/2011   Abilify [aripiprazole] Other (See Comments) 12/17/2011   Aripiprazole Other (See Comments) 02/15/2011   Ciprocin-fluocin-procin [fluocinolone acetonide] Other (See Comments) 02/15/2011   Fexofenadine hcl Other (See Comments) 02/15/2011   Fluoxetine hcl Other (See Comments) 10/31/2012   Gabapentin Other (See Comments) 11/13/2012   Haloperidol decanoate Other (See Comments) 10/31/2012   Iodine Other (See Comments) 02/15/2011   Lamotrigine Other (See Comments) 01/14/2015   Lithium  Other (See Comments) 02/15/2011   Lurasidone hcl Other (See Comments) 12/19/2013   Marcaine [bupivacaine hcl] Other (See Comments) 12/17/2011   Mirtazapine Other (See Comments) 02/15/2011   Paroxetine hcl Other (See Comments) 02/15/2011   Pregabalin Other (See Comments) 02/15/2011   Quetiapine fumarate Swelling 10/31/2012   Seroquel [quetiapine fumarate] Other (See Comments) 12/17/2011   Tape Other (See Comments) 02/15/2011   Zonisamide Other (See Comments) 02/15/2011    Family History  Problem Relation Age of Onset   Alcohol abuse Mother    Colon polyps Mother    Bipolar disorder Father    Paranoid  behavior Father    Alcohol abuse Father    Bipolar disorder Sister    Anxiety disorder Sister    Alcohol abuse Brother    Drug abuse Brother    Schizophrenia Maternal Grandfather    Anxiety disorder Maternal Aunt    Bipolar disorder Paternal Aunt    Colon polyps Sibling     Social History   Socioeconomic History   Marital status: Single    Spouse name: Not on  file   Number of children: Not on file   Years of education: Not on file   Highest education level: Not on file  Occupational History   Not on file  Tobacco Use   Smoking status: Former    Current packs/day: 1.00    Average packs/day: 1 pack/day for 6.0 years (6.0 ttl pk-yrs)    Types: Cigarettes   Smokeless tobacco: Never  Vaping Use   Vaping status: Never Used  Substance and Sexual Activity   Alcohol use: No   Drug use: Yes    Types: Marijuana   Sexual activity: Not Currently  Other Topics Concern   Not on file  Social History Narrative   Not on file   Social Drivers of Health   Financial Resource Strain: Not on file  Food Insecurity: No Food Insecurity (08/06/2023)   Hunger Vital Sign    Worried About Running Out of Food in the Last Year: Never true    Ran Out of Food in the Last Year: Never true  Transportation Needs: No Transportation Needs (08/06/2023)   PRAPARE - Administrator, Civil Service (Medical): No    Lack of Transportation (Non-Medical): No  Physical Activity: Not on file  Stress: Not on file  Social Connections: Unknown (05/30/2021)   [facility]   Social Network    Social Network: Not on file    Review of Systems: Gen: Denies fever, chills, cold or flulike symptoms, presyncope, syncope.  Admits to dizziness related to vertigo. CV: Denies chest pain, palpitations. Resp: Denies dyspnea at rest, cough. GI: See HPI Heme: See HPI  Physical Exam: BP 106/65 (BP Location: Right Arm, Patient Position: Sitting, Cuff Size: Normal)   Pulse 73   Temp 97.7 F (36.5  C) (Temporal)   Ht 5' 8 (1.727 m)   Wt 178 lb (80.7 kg)   BMI 27.06 kg/m  General:   Alert and oriented. No distress noted. Pleasant and cooperative.  Head:  Normocephalic and atraumatic. Eyes:  Conjuctiva clear without scleral icterus. Heart:  S1, S2 present without murmurs appreciated. Lungs:  Clear to auscultation bilaterally. No wheezes, rales, or rhonchi. No distress.  Abdomen:  +BS, soft, non-tender and non-distended. No rebound or guarding. No HSM or masses noted. Msk:  Symmetrical without gross deformities. Normal posture. Extremities:  Without edema. Neurologic:  Alert and  oriented x4 Psych:  Normal mood and affect.    Assessment:  63 y.o. 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   Epigastric/RUQ abdominal pain: This is a chronic problem for her.  Noted worsening symptoms prior to recent admission in July.  She underwent EGD on 7/15 showing normal esophagus, gastric bypass with 4 cm pouch, intact staple line, GJ anastomosis with erosion, normal examined jejunum.  During her admission, she noted improvement with the addition of Carafate , but was not discharged on this.  Currently taking pantoprazole  once a day and continues to have frequent upper abdominal burning as well as uncontrolled reflux.  Associated intermittent nausea and vomiting.  I have recommended increasing pantoprazole  to 40 mg twice daily, 30 minutes before breakfast and dinner, and adding Carafate  1 g 3 times daily before meals and at bedtime.   GERD: Uncontrolled on pantoprazole  40 mg daily.  Recommended increasing to twice daily.  Could consider trial of Voquezna if PPI  BID isn't helpful.    Elevated LFTs/alpha-1 antitrypsin serum/elevated iron: Acute hepatocellular  pattern LFT elevation during admission in July with max AST 1085, ALT 611, alk phos 295.  LFTs were quickly trending down.  At the time of discharge, AST 18, ALT 56, alk phos 118.  Elevation was suspected to be secondary to ischemic hepatopathy in the setting of documented hypotension.   Choledocholithiasis ruled out with MRCP.  Acute viral hepatitis panel, ANA, AMA, ASMA negative.  Iron and iron saturation elevated, but ferritin within normal limits.  HSV 1 and 2 negative, varicella-zoster negative.  EBV IgG positive, but IgM negative.  Alpha-1 antitrypsin serum was low at 86.   LFTs normalized aside from mild alk phos elevation of 147 on 08/19/2023, but she had a bump in her LFTs when she presented to the ER again for recurrent abdominal pain on 7/28 with AST 148, ALT 67, alk phos 152.  Etiology of this is unclear.  She denies alcohol use, over-the-counter supplements/herbal supplements.  She does not take any significant Tylenol .    Will plan to recheck LFTs at this time.  Considering low alpha-1 antitrypsin, we will plan to recheck this along with a phenotype. Will also check hemochromatosis DNA.    Rectal bleeding/hemorrhoids:  Intermittent rectal bleeding in the setting of hemorrhoids and uncontrolled constipation.  Colonoscopy performed on 08/09/2023 to evaluate rectal bleeding.  She had external and internal hemorrhoids and several tubular adenomas removed from her colon.  Recommended 3-year surveillance colonoscopy and was prescribed Anusol  suppositories.  Patient reports suppositories significantly helped her rectal bleeding, but hemorrhoids have again flared in the setting of constipation.   I am sending in a new prescription of Anusol  suppositories for her.  We discussed the possibility of hemorrhoid banding of internal hemorrhoids.  She has an upcoming appointment with Dr. Kallie on 8/19 and plans to keep this appointment.  She will let us  know after this appointment if she prefers to pursue  hemorrhoid banding.   Chronic constipation:  Currently managing with MiraLAX  and 3 Dulcolax every other day.  Reports she is not able to take Linzess  as it causes diarrhea.  Recommended trial of Amitiza  8 mcg twice daily.  Can add MiraLAX  up to twice a day with this, but advised to stop Dulcolax.   Plan:  HFP, alpha-1 antitrypsin phenotype, hemochromatosis DNA Increase pantoprazole  to 40 mg twice daily, 30 minutes before breakfast and dinner. Start Carafate  1 g 3 times daily before meals and at bedtime. Reinforced GERD diet/lifestyle.  Written instructions provided on AVS. Continue Zofran  4 mg every 8 hours as needed. Stop Ex-Lax and MiraLAX . Start Amitiza  8 mcg twice daily with a meal. Advised that she could resume MiraLAX  1-2 times daily with Amitiza  if needed. Add Benefiber 1 tablespoon daily x 2 weeks, then increase to twice daily. Drink at least 64 ounces of water daily. Anusol  suppositories twice daily as needed Limit toilet time to 2-3 minutes. Avoid straining. Keep upcoming appointment with Dr. Kallie regarding hemorrhoids.  She will let us  know after this appointment if she would like to consider hemorrhoid banding. Follow-up in 8 weeks.   Josette Centers, PA-C [facility] 09/07/2023

## 2023-09-06 DIAGNOSIS — R42 Dizziness and giddiness: Secondary | ICD-10-CM

## 2023-09-06 DIAGNOSIS — R001 Bradycardia, unspecified: Secondary | ICD-10-CM

## 2023-09-07 ENCOUNTER — Encounter: Payer: Self-pay | Admitting: Gastroenterology

## 2023-09-07 ENCOUNTER — Other Ambulatory Visit: Payer: Self-pay | Admitting: Gastroenterology

## 2023-09-07 ENCOUNTER — Ambulatory Visit (INDEPENDENT_AMBULATORY_CARE_PROVIDER_SITE_OTHER): Admitting: Gastroenterology

## 2023-09-07 VITALS — BP 106/65 | HR 73 | Temp 97.7°F | Ht 68.0 in | Wt 178.0 lb

## 2023-09-07 DIAGNOSIS — K5909 Other constipation: Secondary | ICD-10-CM

## 2023-09-07 DIAGNOSIS — Z860101 Personal history of adenomatous and serrated colon polyps: Secondary | ICD-10-CM

## 2023-09-07 DIAGNOSIS — K649 Unspecified hemorrhoids: Secondary | ICD-10-CM

## 2023-09-07 DIAGNOSIS — E8801 Alpha-1-antitrypsin deficiency: Secondary | ICD-10-CM

## 2023-09-07 DIAGNOSIS — K219 Gastro-esophageal reflux disease without esophagitis: Secondary | ICD-10-CM | POA: Diagnosis not present

## 2023-09-07 DIAGNOSIS — R1011 Right upper quadrant pain: Secondary | ICD-10-CM | POA: Diagnosis not present

## 2023-09-07 DIAGNOSIS — K644 Residual hemorrhoidal skin tags: Secondary | ICD-10-CM

## 2023-09-07 DIAGNOSIS — R1013 Epigastric pain: Secondary | ICD-10-CM | POA: Diagnosis not present

## 2023-09-07 DIAGNOSIS — R1010 Upper abdominal pain, unspecified: Secondary | ICD-10-CM

## 2023-09-07 DIAGNOSIS — K625 Hemorrhage of anus and rectum: Secondary | ICD-10-CM

## 2023-09-07 DIAGNOSIS — K5904 Chronic idiopathic constipation: Secondary | ICD-10-CM

## 2023-09-07 DIAGNOSIS — K648 Other hemorrhoids: Secondary | ICD-10-CM

## 2023-09-07 DIAGNOSIS — R7989 Other specified abnormal findings of blood chemistry: Secondary | ICD-10-CM | POA: Diagnosis not present

## 2023-09-07 MED ORDER — HYDROCORTISONE ACETATE 25 MG RE SUPP: 25.0000 mg | Freq: Two times a day (BID) | RECTAL | 1 refills | Status: DC

## 2023-09-07 MED ORDER — SUCRALFATE 1 G PO TABS
1.0000 g | ORAL_TABLET | Freq: Three times a day (TID) | ORAL | 2 refills | Status: AC
Start: 2023-09-07 — End: ?

## 2023-09-07 MED ORDER — HYDROCORTISONE (PERIANAL) 2.5 % EX CREA: 1.0000 | TOPICAL_CREAM | Freq: Two times a day (BID) | CUTANEOUS | 1 refills | Status: AC

## 2023-09-07 MED ORDER — LUBIPROSTONE 8 MCG PO CAPS: 8.0000 ug | ORAL_CAPSULE | Freq: Two times a day (BID) | ORAL | 3 refills | Status: DC

## 2023-09-07 MED ORDER — PANTOPRAZOLE SODIUM 40 MG PO TBEC: 40.0000 mg | DELAYED_RELEASE_TABLET | Freq: Two times a day (BID) | ORAL | 3 refills | Status: DC

## 2023-09-07 NOTE — Patient Instructions (Addendum)
 Please have blood work completed at [facility].   For your upper abdominal burning and reflux:  Increase pantoprazole  to 40 mg twice daily, 30 minutes before breakfast and 30 minutes before dinner. Start Carafate  1 g 3 times daily before meals and at bedtime.  Dissolve the tablet in 1-2 tablespoons of water and drink the slurry.  Follow a GERD diet:  Avoid fried, fatty, greasy, spicy, citrus foods. Avoid caffeine  and carbonated beverages. Avoid chocolate. Try eating 4-6 small meals a day rather than 3 large meals. Do not eat within 3 hours of laying down. Prop head of bed up on wood or bricks to create a 6 inch incline.  Continue to use Zofran  every 8 hours as needed for nausea/vomiting.  For constipation: Stop Ex-Lax and MiraLAX . Start Amitiza  8 mcg twice daily with a meal. If you need something in addition to Amitiza , you can take MiraLAX  17 g up to twice a day. Try to drink at least 64 ounces of water daily.   For rectal bleeding: I am sending in a new prescription of Anusol  suppositories for you to use twice a day as needed for hemorrhoids. Limit toilet time to 2-3 minutes. Avoid straining. I also recommend that you add Benefiber 1 tablespoon daily for 2 weeks, then increase to twice daily.  This should also help with your constipation. Keep your upcoming appointment with Dr. Kallie.  After your discussion with her, if you feel that you would like to proceed with hemorrhoid banding, please let us  know and we can get you scheduled for an evaluation here.  We will follow-up with you in the office in 8 weeks.   Josette Centers, PA-C [facility]

## 2023-09-08 ENCOUNTER — Other Ambulatory Visit: Payer: Self-pay | Admitting: *Deleted

## 2023-09-09 ENCOUNTER — Ambulatory Visit

## 2023-09-13 ENCOUNTER — Telehealth: Payer: Self-pay | Admitting: Gastroenterology

## 2023-09-13 ENCOUNTER — Ambulatory Visit

## 2023-09-13 ENCOUNTER — Encounter: Payer: Self-pay | Admitting: General Surgery

## 2023-09-13 ENCOUNTER — Ambulatory Visit: Admitting: General Surgery

## 2023-09-13 VITALS — BP 138/70 | HR 61 | Temp 97.8°F | Resp 16 | Ht 68.0 in | Wt 182.0 lb

## 2023-09-13 DIAGNOSIS — K642 Third degree hemorrhoids: Secondary | ICD-10-CM | POA: Diagnosis not present

## 2023-09-13 NOTE — Patient Instructions (Signed)
 Hemorrhoid surgery for external hemorrhoids is very painful. The pain and discomfort that the patient is having currently will be magnified after the surgery for at least 2-3 weeks.  The patient will have feelings of constant pressure and pain in the area from the swelling and removal of the anoderm (skin around the anus). The internal hemorrhoids are not painful to remove because the same nerves are not involved, and the sensation is different, but removal of any external hemorrhoids will cause significant discomfort. They will need at least 4-6 weeks to recover from the surgery, and should not expect to be able to feel back to normal for 6-8 weeks.    The risk of hemorrhoid surgery include bleeding, risk of infection although rare, and the risk of narrowing the anal canal if too much tissue is removed. Given this risk, it is likely that only the 2 largest hemorrhoid columns would be removed during the initial surgery.  We have also discussed the risk of incontinence after surgery if the muscles were injured, and although this is rare that it can happen and is another reason to limit the amount of hemorrhoids removed.

## 2023-09-13 NOTE — Telephone Encounter (Signed)
 Please reach patient to let her know Dr. Kallie notified us  that she would like to pursue hemorrhoid banding and arrange appointment with Therisa or Charmaine for banding.

## 2023-09-13 NOTE — Progress Notes (Signed)
 [facility] History and Physical  Reason for Referral: Hemorrhoids  Referring Physician: Dr. Antonetta   Chief Complaint   New Patient (Initial Visit)     Vicki Thomas is a 64 y.o. female.  HPI:   Discussed the use of AI scribe software for clinical note transcription with the patient, who gave verbal consent to proceed.  History of Present Illness Vicki Thomas is a 63 year old female with chronic constipation and hemorrhoids who presents with rectal bleeding and discomfort. She was referred by Dr. Antonetta for evaluation of her hemorrhoids and rectal bleeding.  She experiences rectal bleeding and discomfort. She describes having chronic constipation, which she has been managing with various treatments over time.  Her history of chronic constipation has led her to use Miralax  and Exlax to aid bowel movements. Recently, she switched from Linzess  to Amitiza , which she started two to three days ago. Despite these treatments, she continues to experience hard stools, describing them as 'close to being impacted.'   Her diet has been challenging due to stomach issues, making it difficult to maintain a consistent intake of fiber and water. She drinks about two individual bottles of water a day, which is less than the recommended amount. She is working on increasing her water intake and fiber consumption, including fruits and vegetables, to help manage her constipation.  She has a history of gallbladder removal and has undergone an endoscopy. Further tests are planned to investigate her ongoing stomach issues, which have been causing significant discomfort, particularly over the past week. She reports being up all night due to stomach pain.  She experiences hemorrhoids that protrude during bowel movements and require medication to retract. She has used suppositories in the past but finds them too expensive. She manages her symptoms with sitz baths and ice water to reduce  swelling and bleeding.       Past Medical History:  Diagnosis Date   Anxiety    Arthritis    Bipolar 1 disorder (HCC)    Chronic back pain    Chronic fatigue    Chronic headaches    Chronic pain    Fibromyalgia    Hiatal hernia    History of borderline personality disorder    Interstitial cystitis    Lymphedema    Panic disorder with agoraphobia    PTSD (post-traumatic stress disorder)     Past Surgical History:  Procedure Laterality Date   ABDOMINAL HYSTERECTOMY     APPENDECTOMY     BARIATRIC SURGERY     CHOLECYSTECTOMY     COLONOSCOPY N/A 08/09/2023   Procedure: COLONOSCOPY;  Surgeon: Eartha Angelia Sieving, MD;  Location: [facility];  Service: Gastroenterology;  Laterality: N/A;   ESOPHAGOGASTRODUODENOSCOPY N/A 08/09/2023   Procedure: EGD (ESOPHAGOGASTRODUODENOSCOPY);  Surgeon: Eartha Angelia, Sieving, MD;  Location: [facility];  Service: Gastroenterology;  Laterality: N/A;   ESOPHAGUS SURGERY     FOOT SURGERY     HEMORRHOID SURGERY     HERNIA REPAIR     TONSILLECTOMY      Family History  Problem Relation Age of Onset   Alcohol abuse Mother    Colon polyps Mother    Bipolar disorder Father    Paranoid behavior Father    Alcohol abuse Father    Bipolar disorder Sister    Anxiety disorder Sister    Alcohol abuse Brother    Drug abuse Brother    Schizophrenia Maternal Grandfather    Anxiety disorder Maternal Aunt  Bipolar disorder Paternal Aunt    Colon polyps Sibling     Social History   Tobacco Use   Smoking status: Former    Current packs/day: 1.00    Average packs/day: 1 pack/day for 6.0 years (6.0 ttl pk-yrs)    Types: Cigarettes   Smokeless tobacco: Never  Vaping Use   Vaping status: Never Used  Substance Use Topics   Alcohol use: No   Drug use: Yes    Types: Marijuana    Medications: I have reviewed the patient's current medications. Allergies as of 09/13/2023       Reactions   Bee Venom Swelling   Requires Epipen    Brexpiprazole  Other (See Comments)   Severe head pain   Erythromycin Shortness Of Breath   Throat closes, flushing    Penicillins Shortness Of Breath   Throat closes, flushing   Abilify [aripiprazole] Other (See Comments)   Unknown    Aripiprazole Other (See Comments)   Unknown   Ciprocin-fluocin-procin [fluocinolone Acetonide] Other (See Comments)   Unknown   Fexofenadine Hcl Other (See Comments)   Unknown    Fluoxetine Hcl Other (See Comments)   Aggression   Gabapentin Other (See Comments)   Dizziness and excessive falling   Haloperidol Decanoate Other (See Comments)   Facial mask symptoms   Iodine Other (See Comments)   Unknown    Lamotrigine Other (See Comments)   Vertigo Increased falls   Lithium  Other (See Comments)   Unknown    Lurasidone Hcl Other (See Comments)   Feels like her head is going to explode Dizziness   Marcaine [bupivacaine Hcl] Other (See Comments)   Unknown    Mirtazapine Other (See Comments)   Dizziness, Blurred vision   Paroxetine Hcl Other (See Comments)   Unknown    Pregabalin Other (See Comments)   Unknown    Quetiapine Fumarate Swelling   Seroquel [quetiapine Fumarate] Other (See Comments)   Unknown    Tape Other (See Comments)   Unknown    Zonisamide Other (See Comments)   Unknown         Medication List        Accurate as of September 13, 2023 10:59 AM. If you have any questions, ask your nurse or doctor.          STOP taking these medications    promethazine  12.5 MG tablet Commonly known as: PHENERGAN  Stopped by: Manuelita JAYSON Pander       TAKE these medications    aluminum -magnesium  hydroxide 200-200 MG/5ML suspension Take 15 mLs by mouth every 8 (eight) hours as needed for indigestion.   benztropine  0.5 MG tablet Commonly known as: COGENTIN  Take 0.5 mg by mouth daily.   bisacodyl  5 MG EC tablet Commonly known as: DULCOLAX Take 5 mg by mouth daily as needed for moderate constipation.   Blood Glucose Monitoring  Suppl Devi 1 each by Does not apply route in the morning, at noon, and at bedtime. May substitute to any manufacturer covered by patient's insurance.   BLOOD GLUCOSE TEST STRIPS Strp 1 each by In Vitro route in the morning, at noon, and at bedtime. May substitute to any manufacturer covered by patient's insurance.   busPIRone  7.5 MG tablet Commonly known as: BUSPAR  Take 0.5 tablets (3.75 mg total) by mouth 3 (three) times daily as needed (anxiety).   calcium carbonate 750 MG chewable tablet Commonly known as: TUMS EX Chew 1 tablet by mouth daily as needed for heartburn.   clotrimazole  1 %  vaginal cream Commonly known as: GYNE-LOTRIMIN  Place 1 Applicatorful vaginally at bedtime.   hydrocortisone  2.5 % rectal cream Commonly known as: Anusol -HC Place 1 Application rectally 2 (two) times daily.   lubiprostone  8 MCG capsule Commonly known as: AMITIZA  Take 1 capsule (8 mcg total) by mouth 2 (two) times daily with a meal.   meclizine  25 MG tablet Commonly known as: ANTIVERT  Take 1 tablet (25 mg total) by mouth 3 (three) times daily as needed for dizziness.   midodrine  5 MG tablet Commonly known as: PROAMATINE  Take 1 tablet (5 mg total) by mouth 3 (three) times daily with meals.   ondansetron  8 MG disintegrating tablet Commonly known as: ZOFRAN -ODT Take 1 tablet (8 mg total) by mouth every 8 (eight) hours as needed for nausea or vomiting.   pantoprazole  40 MG tablet Commonly known as: PROTONIX  Take 1 tablet (40 mg total) by mouth 2 (two) times daily before a meal.   polyethylene glycol 17 g packet Commonly known as: MIRALAX  / GLYCOLAX  Take 17 g by mouth daily.   potassium chloride  10 MEQ tablet Commonly known as: KLOR-CON  Take 1 tablet (10 mEq total) by mouth daily as needed (pot drops, muscle spasms).   simethicone  125 MG chewable tablet Commonly known as: MYLICON Chew 125 mg by mouth every 6 (six) hours as needed for flatulence.   sucralfate  1 g tablet Commonly known  as: Carafate  Take 1 tablet (1 g total) by mouth 4 (four) times daily -  with meals and at bedtime. Dissolve tablet in 1-2 tablespoons of water, then drink slurry.   traZODone  50 MG tablet Commonly known as: DESYREL  Take 1 tablet (50 mg total) by mouth at bedtime as needed for sleep.   Uzedy  50 MG/0.14ML Susy Generic drug: risperiDONE  ER Inject 50 mg into the skin every 14 (fourteen) days.   valbenazine 60 MG capsule Commonly known as: INGREZZA Take 60 mg by mouth daily.         ROS:  A comprehensive review of systems was negative except for: Gastrointestinal: positive for abdominal pain and hemorrhoid pain, constipation, bleeding  Blood pressure 138/70, pulse 61, temperature 97.8 F (36.6 C), temperature source Oral, resp. rate 16, height 5' 8 (1.727 m), weight 182 lb (82.6 kg), SpO2 97%.  Physical Exam MEASUREMENTS: Weight- 182. GENERAL: Alert, cooperative, well developed, no acute distress. HEENT: Normocephalic, normal oropharynx, moist mucous membranes. CHEST: Clear to auscultation bilaterally, no wheezes, rhonchi, or crackles. CARDIOVASCULAR: Normal heart rate and rhythm ABDOMEN: Soft, non-tender, non-distended, without organomegaly, normal bowel sounds. RECTAL: Small hemorrhoid columns present, with the right posterior hemorrhoid being the largest, located on the right side towards the back, minimal external tissue noted, minor hemorrhoids on valsalva  EXTREMITIES: No cyanosis or edema. NEUROLOGICAL: Cranial nerves grossly intact, moves all extremities without gross motor or sensory deficit.  Results:  Assessment and Plan: Assessment and Plan Assessment & Plan Likely Grade II-III Internal with bleeding and minor external component mostly on the right posterior aspect   Hemorrhoid surgery for external hemorrhoids is very painful. The pain and discomfort that the patient is having currently will be magnified after the surgery for at least 2-3 weeks.  The patient will  have feelings of constant pressure and pain in the area from the swelling and removal of the anoderm (skin around the anus). The internal hemorrhoids are not painful to remove because the same nerves are not involved, and the sensation is different, but removal of any external hemorrhoids will cause significant discomfort. They will  need at least 4-6 weeks to recover from the surgery, and should not expect to be able to feel back to normal for 6-8 weeks.    The risk of hemorrhoid surgery include bleeding, risk of infection although rare, and the risk of narrowing the anal canal if too much tissue is removed. Given this risk, it is likely that only the 2 largest hemorrhoid columns would be removed during the initial surgery.  We have also discussed the risk of incontinence after surgery if the muscles were injured, and although this is rare that it can happen and is another reason to limit the amount of hemorrhoids removed.     Right now she wants to see if GI can band the hemorrhoids to see if that helps, I think this is reasonable since there is very minimal to no  external tissue.    Constipation management emphasized. Increase water intake  Continue high fiber diet with fruits and vegetables.     All questions were answered to the satisfaction of the patient.  Manuelita JAYSON Pander 09/13/2023, 10:59 AM

## 2023-09-19 ENCOUNTER — Telehealth: Payer: Self-pay

## 2023-09-19 ENCOUNTER — Ambulatory Visit

## 2023-09-19 NOTE — Telephone Encounter (Signed)
 Copied from CRM #[id number]. Topic: Appointments - Appointment Info/Confirmation >> Sep 19, 2023 10:44 AM [name] wrote: Patient/patient representative is calling for information regarding an appointment.  Vicki Thomas missed her Wellness Visit because she was sick last night and she was sleeping when they called this morning. Please call her back at [phone]. I did try the call the number in appointment note and no one answered. Thanks

## 2023-09-21 ENCOUNTER — Encounter: Payer: Self-pay | Admitting: *Deleted

## 2023-09-21 ENCOUNTER — Ambulatory Visit: Payer: Self-pay | Admitting: Cardiology

## 2023-09-21 LAB — ALPHA-1 ANTITRYPSIN PHENOTYPE: A-1 Antitrypsin, Ser: 73 mg/dL — ABNORMAL LOW (ref 83–199)

## 2023-09-21 LAB — HEPATIC FUNCTION PANEL
AG Ratio: 1.9 (calc) (ref 1.0–2.5)
ALT: 161 U/L — ABNORMAL HIGH (ref 6–29)
AST: 247 U/L — ABNORMAL HIGH (ref 10–35)
Albumin: 4 g/dL (ref 3.6–5.1)
Alkaline phosphatase (APISO): 144 U/L (ref 37–153)
Bilirubin, Direct: 0.1 mg/dL (ref 0.0–0.2)
Globulin: 2.1 g/dL (ref 1.9–3.7)
Indirect Bilirubin: 0.4 mg/dL (ref 0.2–1.2)
Total Bilirubin: 0.5 mg/dL (ref 0.2–1.2)
Total Protein: 6.1 g/dL (ref 6.1–8.1)

## 2023-09-21 LAB — HEMOCHROMATOSIS DNA-PCR(C282Y,H63D)

## 2023-09-22 ENCOUNTER — Encounter (HOSPITAL_COMMUNITY): Payer: Self-pay | Admitting: Emergency Medicine

## 2023-09-22 ENCOUNTER — Ambulatory Visit: Payer: Self-pay | Admitting: Gastroenterology

## 2023-09-22 ENCOUNTER — Other Ambulatory Visit: Payer: Self-pay

## 2023-09-22 ENCOUNTER — Emergency Department (HOSPITAL_COMMUNITY)
Admission: EM | Admit: 2023-09-22 | Discharge: 2023-09-22 | Discharge status: Against Medical Advice | Attending: Emergency Medicine | Admitting: Emergency Medicine

## 2023-09-22 DIAGNOSIS — Z5321 Procedure and treatment not carried out due to patient leaving prior to being seen by health care provider: Secondary | ICD-10-CM | POA: Insufficient documentation

## 2023-09-22 DIAGNOSIS — R739 Hyperglycemia, unspecified: Secondary | ICD-10-CM | POA: Insufficient documentation

## 2023-09-22 DIAGNOSIS — R7989 Other specified abnormal findings of blood chemistry: Secondary | ICD-10-CM

## 2023-09-22 DIAGNOSIS — K219 Gastro-esophageal reflux disease without esophagitis: Secondary | ICD-10-CM

## 2023-09-22 LAB — CBG MONITORING, ED
Glucose-Capillary: 121 mg/dL — ABNORMAL HIGH (ref 70–99)
Glucose-Capillary: 107 mg/dL — ABNORMAL HIGH (ref 70–99)

## 2023-09-22 LAB — CBC
HCT: 39.4 % (ref 36.0–46.0)
Hemoglobin: 12.4 g/dL (ref 12.0–15.0)
MCH: 31.3 pg (ref 26.0–34.0)
MCHC: 31.5 g/dL (ref 30.0–36.0)
MCV: 99.5 fL (ref 80.0–100.0)
Platelets: 243 K/uL (ref 150–400)
RBC: 3.96 MIL/uL (ref 3.87–5.11)
RDW: 12.5 % (ref 11.5–15.5)
WBC: 7.5 K/uL (ref 4.0–10.5)
nRBC: 0 % (ref 0.0–0.2)

## 2023-09-22 NOTE — ED Triage Notes (Signed)
 Pt BIB EMS for fluctuation in blood sugars, per pt blood sugars drop to 70s and up to 243.

## 2023-09-26 NOTE — Progress Notes (Unsigned)
 Cardiology Office Note:  .   Date:  09/28/2023  ID:  Vicki Thomas, DOB [birth year], MRN [id number] PCP: Bevely Doffing, FNP  [facility] Providers Cardiologist:  Alvan Carrier, MD {  History of Present Illness: Vicki   YESENNIA Thomas is a 63 y.o. female  with PMHx of atypical chest pain (Lexiscan  08/11/2023: normal study), palpations (Zio 7/23-08/29/2023: predominately NSR, avg HR 69, HR range 39-138, < 1% PAC/PVC's), anxiety, schizophrenia, PTSD who reports to [facility] office for follow up.   First seen by heartcare with Dr. Alvan during recent hospitalization 7/11-18/2025 for epigastric pain with prior hx of cholecystectomy and gastric sleeve surgery. She was scheduled for EGD/colonoscopy but delayed due to poor bowel prep on 7/14. On 08/09/2023, EGD showed gastric bypass erosions at anastomosis and Colonoscopy showed multiple polyps that was removed, diverticulosis. Hospital course also complicated by hypotension, which was treated with Midodrine . Also elevated LFT that was thought secondary to transient hypotension. Cardiology was consulted for chest pain. Echo 08/07/2023 showed EF 55-60%, no RWMA, mildly elevated PASP, trivial MV regurgitation, mild AV calcification w/o AS, dilated IVC. Lexiscan  08/11/2023 showed normal study with EF 55-65% and findings c/w artifact. In the setting of palpitation/dizziness complaints and PVC on EKG, ordered outpatient zio monitor, which showed predominately NSR, avg HR 69, HR range 39-138, < 1% PAC/PVC's. Discharged on midodrine  5 mg TID.   Patient returned to ED 08/17/2023 for chest pain but left prior to being seen by provider and refused repeat labs due to wait time and not being roomed. Tn 4, CBC WNL, BMP WNL except Ca 8.7 and Gluc 108.   Today, reports ongoing chronic atypical chest pain described as heaviness, occurs while laying down waking patient up, lasting 15 to 20 minutes, 7 out of 10, occurring more frequently 2-3 times per day.  Majority of  time CP associated with stomach pains. CP is also associated with SOB when waking up.  Denies any exertional angina however low activity level due to fatigue, poor stamina and ongoing GI/back pains.  Reports chronic unchanged edema for years.  Also notes chronic unchanged dizziness that is relieved with meclizine .  Denies palpitations, syncope. Reports compliance with medications.  Endorses salt cravings and high sodium diet. Denies tobacco use/alcohol/drug use   Patient still has pains from stomach and back, which is being followed closely by GI and PCP.  GI is planning for liver biopsy on 9/22.  GI suspects secondary to pancreatitis but plans for future GI testing.  Patient notes that she will also be undergoing testing for hemochromatosis and alpha 1 trypsin deficiency due to hereditary associations.  ROS: 10 point review of system has been reviewed and considered negative except ones been listed in the HPI.   Studies Reviewed: Vicki   ECHO 08/07/2023 IMPRESSIONS   1. Left ventricular ejection fraction, by estimation, is 55 to 60%. The  left ventricle has normal function. The left ventricle has no regional  wall motion abnormalities. Left ventricular diastolic parameters were  normal.   2. Right ventricular systolic function is normal. The right ventricular  size is normal. There is mildly elevated pulmonary artery systolic  pressure. The estimated right ventricular systolic pressure is 36.9 mmHg.   3. The mitral valve is normal in structure. Trivial mitral valve  regurgitation. No evidence of mitral stenosis.   4. The aortic valve is grossly normal. There is mild calcification of the  aortic valve. Aortic valve regurgitation is not visualized. No aortic  stenosis is present.  5. The inferior vena cava is dilated in size with <50% respiratory  variability, suggesting right atrial pressure of 15 mmHg.   Lexiscan  Stress Test 07/2023 Narrative & Impression      The study is normal. There are no  perfusion defects consistent with prior infarct or current ischemia.  The study is low risk.   No ST deviation was noted.   Left ventricular function is normal. Nuclear stress EF: 61%. The left ventricular ejection fraction is normal (55-65%). End diastolic cavity size is normal.   There is a moderate size mild to moderate intensity inferior defect most intense in the resting images with normal wall motion. Finding is consistent with artifact due to adjacent gut radiotracer uptake.   Zio 7/23-08/29/2023   11 day monitor   Rare supraventricular ectopy in the form of isolated PACs, couplets   Rare ventricular ectopy in the form of isolated PVCs, couplets   Patient triggered events but no diary included. Events correlate with normal sinus rhythm   No significant arrhythmias   Patch Wear Time:  11 days and 7 hours (2025-07-23T18:56:46-0400 to 2025-08-04T01:59:17-0400)   Patient had a min HR of 39 bpm, max HR of 138 bpm, and avg HR of 69 bpm. Predominant underlying rhythm was Sinus Rhythm. Isolated SVEs were rare (<1.0%), SVE Couplets were rare (<1.0%), and no SVE Triplets were present. Isolated VEs were rare (<1.0%), VE  Couplets were rare (<1.0%), and no VE Triplets were present. Ventricular Bigeminy was present.   Physical Exam:   VS:  BP 108/68 (BP Location: Left Arm, Cuff Size: Large)   Pulse 80   Ht 5' 8 (1.727 m)   Wt 186 lb (84.4 kg)   SpO2 96%   BMI 28.28 kg/m    Wt Readings from Last 3 Encounters:  09/28/23 186 lb (84.4 kg)  09/22/23 181 lb (82.1 kg)  09/13/23 182 lb (82.6 kg)    GEN: Well nourished, well developed in no acute distress while sitting in chair.  NECK: No JVD; No carotid bruits CARDIAC: RRR, no murmurs, rubs, gallops RESPIRATORY:  Clear to auscultation without rales, wheezing or rhonchi  ABDOMEN: Soft, non-tender, non-distended EXTREMITIES:  No edema; No deformity   ASSESSMENT AND PLAN: .   Atypical Chest pain Recent hospitalization 7/11-18/2025.  Determined  noncardiac chest pain with negative troponin, EKG without ischemic changes, normal echo, atypical presentation, normal lexiscan    Reports ongoing atypical chest pain that occurs most consistently when laying down and waking her up that has increased in frequency.  Associated with SOB. Based on previous ischemic workup and presentation, less concern for ACS.  Suspect secondary to GI since symptoms consistent with laying down and associated with abdominal pain.  Per patient, she is still following with GI and awaiting further GI workup testing.  Can reassess based on GI results.  If chest pain still persists on next OV then would consider discussing cardiac CTA. Would avoid Imdur at this time with low BP.    Hypotension Dizziness Recent hospitalization 7/11-18/2025 complicated by hypotension. D/C on midodrine  5 mg TID.  BP at home reported as 100s/60s BP in office today 108/68 Reports chronic unchanged dizziness that is relieved with meclizine . Continue midodrine  5 mg 3 times daily   PVC  Palpitations  Reported palpitations in correlation to chest pain as above in 07/2023 hospitalization.  Reviewed EKG showed normal sinus bradycardia, HR 51 with PVC, unchanged nonspecific ST changes.  Zio 7/23-08/29/2023: predominately NSR, avg HR 69, HR range 39-138, < 1%  PAC/PVC's Denies any palpitations   Transaminitis Elevated LFT 07/2023 that was thought secondary to transient hypotension Not on any statin medications Undergoing GI workup as above.     Dispo: F/U in 3 m/o with JB or SD.   Signed, Lorette CINDERELLA Kapur, PA-C

## 2023-09-27 ENCOUNTER — Other Ambulatory Visit: Payer: Self-pay | Admitting: *Deleted

## 2023-09-27 DIAGNOSIS — O288 Other abnormal findings on antenatal screening of mother: Secondary | ICD-10-CM

## 2023-09-27 DIAGNOSIS — E8801 Alpha-1-antitrypsin deficiency: Secondary | ICD-10-CM

## 2023-09-27 DIAGNOSIS — R7989 Other specified abnormal findings of blood chemistry: Secondary | ICD-10-CM

## 2023-09-27 MED ORDER — RABEPRAZOLE SODIUM 20 MG PO TBEC: 20.0000 mg | DELAYED_RELEASE_TABLET | Freq: Two times a day (BID) | ORAL | 3 refills | Status: AC

## 2023-09-28 ENCOUNTER — Ambulatory Visit: Attending: Student | Admitting: Physician Assistant

## 2023-09-28 ENCOUNTER — Encounter: Payer: Self-pay | Admitting: Physician Assistant

## 2023-09-28 VITALS — BP 108/68 | HR 80 | Ht 68.0 in | Wt 186.0 lb

## 2023-09-28 DIAGNOSIS — R0789 Other chest pain: Secondary | ICD-10-CM | POA: Diagnosis not present

## 2023-09-28 DIAGNOSIS — I493 Ventricular premature depolarization: Secondary | ICD-10-CM

## 2023-09-28 DIAGNOSIS — R002 Palpitations: Secondary | ICD-10-CM

## 2023-09-28 DIAGNOSIS — R42 Dizziness and giddiness: Secondary | ICD-10-CM | POA: Diagnosis not present

## 2023-09-28 DIAGNOSIS — I959 Hypotension, unspecified: Secondary | ICD-10-CM | POA: Diagnosis not present

## 2023-09-28 DIAGNOSIS — R7401 Elevation of levels of liver transaminase levels: Secondary | ICD-10-CM

## 2023-09-28 NOTE — Patient Instructions (Signed)
 Medication Instructions:  Your physician recommends that you continue on your current medications as directed. Please refer to the Current Medication list given to you today.  *If you need a refill on your cardiac medications before your next appointment, please call your pharmacy*  Lab Work: NONE   If you have labs (blood work) drawn today and your tests are completely normal, you will receive your results only by: MyChart Message (if you have MyChart) OR A paper copy in the mail If you have any lab test that is abnormal or we need to change your treatment, we will call you to review the results.  Testing/Procedures: NONE   Follow-Up: At [facility], you and your health needs are our priority.  As part of our continuing mission to provide you with exceptional heart care, our providers are all part of one team.  This team includes your primary Cardiologist (physician) and Advanced Practice Providers or APPs (Physician Assistants and Nurse Practitioners) who all work together to provide you with the care you need, when you need it.  Your next appointment:   3 month(s)  Provider:   You may see Alvan Carrier, MD or one of the following Advanced Practice Providers on your designated Care Team:   Laymon Qua, PA-C  Scotesia Belfry, [place] Olivia Pavy, [place]     We recommend signing up for the patient portal called MyChart.  Sign up information is provided on this After Visit Summary.  MyChart is used to connect with patients for Virtual Visits (Telemedicine).  Patients are able to view lab/test results, encounter notes, upcoming appointments, etc.  Non-urgent messages can be sent to your provider as well.   To learn more about what you can do with MyChart, go to [email or web address].   Other Instructions Thank you for choosing [facility]!

## 2023-09-29 LAB — HEPATIC FUNCTION PANEL
ALT: 30 IU/L (ref 0–32)
AST: 29 IU/L (ref 0–40)
Albumin: 4 g/dL (ref 3.9–4.9)
Alkaline Phosphatase: 108 IU/L (ref 44–121)
Bilirubin Total: 0.3 mg/dL (ref 0.0–1.2)
Bilirubin, Direct: 0.13 mg/dL (ref 0.00–0.40)
Total Protein: 5.8 g/dL — ABNORMAL LOW (ref 6.0–8.5)

## 2023-09-29 NOTE — Telephone Encounter (Signed)
 Pt has been contacted and rescheduled for her liver biopsy until 10/19/23.

## 2023-09-30 ENCOUNTER — Other Ambulatory Visit: Payer: Self-pay | Admitting: *Deleted

## 2023-09-30 DIAGNOSIS — K834 Spasm of sphincter of Oddi: Secondary | ICD-10-CM

## 2023-10-06 ENCOUNTER — Encounter: Payer: Self-pay | Admitting: Gastroenterology

## 2023-10-06 ENCOUNTER — Ambulatory Visit: Payer: Self-pay

## 2023-10-06 NOTE — Telephone Encounter (Signed)
 FYI Only or Action Required?: Action required by provider: medication refill request.  Patient was last seen in primary care on 08/19/2023 by Antonetta Rollene BRAVO, MD.  Called Nurse Triage reporting Blood Sugar Problem.  Symptoms began several months ago.  Interventions attempted: Rest, hydration, or home remedies.  Symptoms are: unchanged.  Triage Disposition: Call PCP Within 24 Hours-needing test strips.   Patient/caregiver understands and will follow disposition?:   Copied from CRM [id number]. Topic: Clinical - Red Word Triage >> Oct 06, 2023  4:45 PM Delon HERO wrote: Red Word that prompted transfer to Nurse Triage: Patient is calling to get a refill on test strips. Have not had a hospital follow up. Reporting that her sugars 33-40s in the 1:00a. Patient eats something. Then goes back to sleep. Patient reports when her sugars are low she can't see, she is trembling, and see can't see Then goes up to the 300 during the day. Patient reporting she has had 2 follow up appts but can not keep them due to her sleeping pattern and not getting sleep to eat in the middle of the night.  2 eggs, hams, toast - sugars are 92 and headed down Trembling now and clammy Reason for Disposition  [1] Caller has NON-URGENT medication or insulin device (e.g., pump, continuous monitoring) question AND [2] triager unable to answer question  Answer Assessment - Initial Assessment Questions 1. SYMPTOMS: What symptoms are you concerned about?     Patient is concerned with blood sugar drops that patient has been having in the middle of the night. Patient states she is having to get up at night to eat three times a night.  2. ONSET:  When did the symptoms start?     Patient states this has been going on for a year but has gotten progressively worse.  3. BLOOD GLUCOSE: What is your blood glucose level?      92 4. USUAL RANGE: What is your blood glucose level usually? (e.g., usual fasting morning value, usual  evening value)     Patient is unsure what her normal blood glucose is.  5. TYPE 1 or 2:  Do you know what type of diabetes you have?  (e.g., Type 1, Type 2, Gestational; doesn't know)      Patient believes she has hypoglycemia-has an appointment with endocrinology.  6. INSULIN: Do you take insulin? What type of insulin(s) do you use? What is the mode of delivery? (syringe, pen; injection or pump) When did you last give yourself an insulin dose? (i.e., time or hours/minutes ago) How much did you give? (i.e., how many units)     no 7. DIABETES PILLS: Do you take any pills for your diabetes? If Yes, ask: What is the name of the medicine(s) that you take for high blood sugar?     no 8. OTHER SYMPTOMS: Do you have any symptoms? (e.g., fever, frequent urination, difficulty breathing, vomiting)     Trembling at times, frequent urination 9. LOW BLOOD GLUCOSE TREATMENT: What have you done so far to treat the low blood glucose level?     Eaten food.  10. FOOD: When did you last eat or drink?       Recently eat 2 eggs, ham and toast.  11. ALONE: Are you alone right now or is someone with you?        Yes-patient is alert and oriented. She knows the signs and symptoms when her blood sugar drops.  Patient states she has been dealing with  hypoglycemia for almost a year. Patient has a blood sugar machine but is running low on test strips. Patient reports she is having to wake up to eat at night due to her blood sugar dropping. No nausea, vomiting, shortness of breath. Reports some clamminess but states it is getting better. Patient eat eggs, ham and toast prior to calling. Patient states her blood sugar will increase throughout the day. Patient states having high protein food prepared. Patient is needing test strips. Asking for a follow up call.  Protocols used: Diabetes - Low Blood Sugar-A-AH

## 2023-10-10 ENCOUNTER — Ambulatory Visit (HOSPITAL_COMMUNITY): Admitting: Registered Nurse

## 2023-10-10 ENCOUNTER — Ambulatory Visit: Payer: Self-pay

## 2023-10-10 ENCOUNTER — Other Ambulatory Visit: Payer: Self-pay

## 2023-10-10 DIAGNOSIS — E162 Hypoglycemia, unspecified: Secondary | ICD-10-CM

## 2023-10-10 MED ORDER — BLOOD GLUCOSE TEST VI STRP: ORAL_STRIP | 3 refills | Status: AC

## 2023-10-10 MED ORDER — BLOOD GLUCOSE TEST VI STRP: ORAL_STRIP | 3 refills | Status: DC

## 2023-10-10 NOTE — Telephone Encounter (Signed)
 Patient's current blood sugar is 105. Home health case manager on the phone with patient. Requesting for an acute appointment with nearby office. Offered an appointment at 4:10 PM today but patient doesn't have transportation. Patient accepted an appointment tomorrow at 3:30 PM for an acute visit. Information given to patient about location and provider she will see.

## 2023-10-10 NOTE — Telephone Encounter (Signed)
 FYI Only or Action Required?: Action required by provider: update on patient condition.  Patient was last seen in primary care on 08/19/2023 by Antonetta Rollene BRAVO, MD.  Called Nurse Triage reporting Hypoglycemia.  Symptoms began several days ago.  Interventions attempted: Dietary changes.  Symptoms are: unchanged.  Triage Disposition: See HCP Within 4 Hours (Or PCP Triage), Call PCP Within 24 Hours  Patient/caregiver understands and will follow disposition?: Yes       Copied from CRM #[id number]. Topic: Clinical - Red Word Triage >> Oct 10, 2023 10:58 AM Myrick T wrote: [facility] that prompted transfer to Nurse Triage: patient stated she checked her blood sugar around 2am and it was 30 and it drops through the night so she is having to get up in the middle of the night to eat something to keep it up. She says it makes it hard to sleep at night and to think during the day. Sometimes says it goes down throughout the day but not so low because she is up moving around and eating. Reason for Disposition  [1] Blood glucose 70 mg/dL (3.9 mmol/L) or below OR symptomatic, now improved with Care Advice AND [2] cause unknown  Answer Assessment - Initial Assessment Questions 1. SYMPTOMS: What symptoms are you concerned about?     Low BG at 0200 2. ONSET:  When did the symptoms start?     This AM 3. BLOOD GLUCOSE: What is your blood glucose level?      32 4. USUAL RANGE: What is your blood glucose level usually? (e.g., usual fasting morning value, usual evening value)     Unknown - ongoing hypoglycemia issues s/p stomach surgery and has upcoming endo appt 5. TYPE 1 or 2:  Do you know what type of diabetes you have?  (e.g., Type 1, Type 2, Gestational; doesn't know)      N/a 6. INSULIN: Do you take insulin? What type of insulin(s) do you use? What is the mode of delivery? (syringe, pen; injection or pump) When did you last give yourself an insulin dose? (i.e., time or  hours/minutes ago) How much did you give? (i.e., how many units)     N/a 7. DIABETES PILLS: Do you take any pills for your diabetes? If Yes, ask: What is the name of the medicine(s) that you take for high blood sugar?     N/a 8. OTHER SYMPTOMS: Do you have any symptoms? (e.g., fever, frequent urination, difficulty breathing, vomiting)     Sweating, blurry vision, confusion - sx have improved since eating, but pt has not rechecked levels 9. LOW BLOOD GLUCOSE TREATMENT: What have you done so far to treat the low blood glucose level?     ate 10. FOOD: When did you last eat or drink?       1.5 hours ago 11. ALONE: Are you alone right now or is someone with you?        yes 12. PREGNANCY: Is there any chance you are pregnant? When was your last menstrual period?       N/a    Pt has had ongoing issues with hypoglycemia and has not been diagnosed with diabetes issues. Pt reports has upcoming New Pt appt with endocrinologist coming up, but hypoglycemia during the night has worsened. Pt reports that she wakes up in the middle of the night with confusion and would check her sugars and find herself low, and would eat with improvement of sx. Pt reports that she has needed to eat  every 2 hours to maintain sugar levels.  Triager advised [facility] as walk in. Triager strongly recommended ED for worsening sx/confusion. Patient verbalized understanding and to go to ED with worsening symptoms.  Protocols used: Diabetes - Low Blood Sugar-A-AH

## 2023-10-11 ENCOUNTER — Ambulatory Visit: Admitting: Nurse Practitioner

## 2023-10-11 NOTE — Telephone Encounter (Signed)
 Patient advised.

## 2023-10-12 ENCOUNTER — Telehealth: Payer: Self-pay

## 2023-10-12 NOTE — Telephone Encounter (Signed)
 Copied from CRM [id number]. Topic: Referral - Question >> Oct 12, 2023  3:17 PM Selinda RAMAN wrote: Reason for CRM: The patient called in along with a Case Manager with [facility] stating she received an evaluation for home health services for a nurses aide back in May and never heard back after that. She believes it was with [facility]. 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 Please assist patient further

## 2023-10-14 NOTE — Telephone Encounter (Signed)
 Called [employer]. They state pt was approved back in May for PCS, they are unsure why no one has reached out to patient. I gave pt phone number of [employer] to call. Pt verbalized understanding

## 2023-10-17 ENCOUNTER — Ambulatory Visit (HOSPITAL_COMMUNITY)

## 2023-10-17 ENCOUNTER — Other Ambulatory Visit: Payer: Self-pay | Admitting: Radiology

## 2023-10-17 DIAGNOSIS — R7989 Other specified abnormal findings of blood chemistry: Secondary | ICD-10-CM

## 2023-10-18 NOTE — H&P (Incomplete)
 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   Referring Provider(s): Harper,K,PA-C  Supervising Physician: Hughes Simmonds  Patient Status: [facility] - Out-pt  History of Present Illness: Vicki Thomas is a 63 y.o. 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  She is scheduled today for image guided random core liver biopsy for further evaluation.  *** Patient is Full Code  Past Medical History:  Diagnosis Date   Anxiety    Arthritis    Bipolar 1 disorder (HCC)    Chronic back pain    Chronic fatigue    Chronic headaches    Chronic pain    Fibromyalgia    Hiatal hernia    History of borderline personality disorder    Interstitial cystitis    Lymphedema    Panic disorder with agoraphobia    PTSD (post-traumatic stress disorder)     Past Surgical History:  Procedure Laterality Date   ABDOMINAL HYSTERECTOMY     APPENDECTOMY     BARIATRIC SURGERY     CHOLECYSTECTOMY     COLONOSCOPY N/A 08/09/2023   Procedure: COLONOSCOPY;  Surgeon: Eartha Angelia Sieving, MD;  Location: [facility] ENDO [facility];  Service: Gastroenterology;  Laterality: N/A;   ESOPHAGOGASTRODUODENOSCOPY N/A 08/09/2023   Procedure: EGD (ESOPHAGOGASTRODUODENOSCOPY);  Surgeon: Eartha Angelia, Sieving, MD;  Location: [facility]  ENDO SUITE;  Service: Gastroenterology;  Laterality: N/A;   ESOPHAGUS SURGERY     FOOT SURGERY     HEMORRHOID SURGERY     HERNIA REPAIR     TONSILLECTOMY      Allergies: Bee venom, Brexpiprazole , Erythromycin, Penicillins, Abilify [aripiprazole], Aripiprazole, Ciprocin-fluocin-procin [fluocinolone acetonide], Fexofenadine hcl, Fluoxetine hcl, Gabapentin, Haloperidol decanoate, Iodine, Lamotrigine, Lithium , Lurasidone hcl, Marcaine [bupivacaine hcl], Mirtazapine, Paroxetine hcl, Pregabalin, Quetiapine fumarate, Seroquel [quetiapine fumarate], Tape, and Zonisamide  Medications: Prior to Admission medications   Medication Sig Start Date End Date Taking? Authorizing Provider  aluminum -magnesium  hydroxide 200-200 MG/5ML suspension Take 15 mLs by mouth every 8 (eight) hours as needed for indigestion. 08/22/23   Suellen Cantor A, PA-C  benztropine  (COGENTIN ) 0.5 MG tablet Take 0.5 mg by mouth daily. 07/13/23   [provider]  bisacodyl  (DULCOLAX) 5 MG EC tablet Take 5 mg by mouth daily as needed for moderate constipation.    [provider]  Blood Glucose Monitoring Suppl DEVI 1 each by Does not apply route in the morning, at noon, and at bedtime. May substitute to any manufacturer covered by patient's insurance. 08/12/23   Johnson, Clanford L, MD  busPIRone  (BUSPAR ) 7.5 MG tablet Take 0.5 tablets (3.75 mg total) by mouth 3 (three) times daily as needed (anxiety). 08/12/23   Johnson, Clanford L, MD  calcium carbonate (TUMS EX) 750 MG chewable tablet Chew 1 tablet by mouth daily as needed for heartburn.    [provider]  clotrimazole  (GYNE-LOTRIMIN ) 1 % vaginal cream Place 1 Applicatorful vaginally at bedtime. 05/17/23   Donnelly Mellow, MD  Glucose Blood (BLOOD GLUCOSE TEST STRIPS) STRP Accu chek  guide meter strips. Three times daily testing DX e11.65 10/10/23   Bevely Doffing, FNP  hydrocortisone  (ANUSOL -HC) 2.5 % rectal cream Place 1 Application rectally 2 (two) times daily.  09/07/23   Rudy Josette RAMAN, PA-C  lubiprostone  (AMITIZA ) 8 MCG capsule Take 1 capsule (8 mcg total) by mouth 2 (two) times daily with a meal. 09/07/23   Rudy Josette RAMAN, PA-C  meclizine  (ANTIVERT ) 25 MG tablet Take 1 tablet (25 mg total) by mouth 3 (three) times daily as needed for dizziness. 08/19/23   Antonetta Rollene BRAVO, MD  midodrine  (PROAMATINE ) 5 MG tablet Take 1 tablet (5 mg total) by mouth 3 (three) times daily with meals. 08/12/23   Johnson, Clanford L, MD  ondansetron  (ZOFRAN ) 4 MG tablet Take 4 mg by mouth 2 (two) times daily as needed. 08/26/23   [provider]  polyethylene glycol (MIRALAX  / GLYCOLAX ) 17 g packet Take 17 g by mouth daily. 08/12/23   Johnson, Clanford L, MD  potassium chloride  (KLOR-CON ) 10 MEQ tablet Take 1 tablet (10 mEq total) by mouth daily as needed (pot drops, muscle spasms). 08/12/23   Vicci Afton CROME, MD  RABEprazole  (ACIPHEX ) 20 MG tablet Take 1 tablet (20 mg total) by mouth 2 (two) times daily before a meal. 09/27/23 09/26/24  Rudy, Josette RAMAN, PA-C  simethicone  (MYLICON) 125 MG chewable tablet Chew 125 mg by mouth every 6 (six) hours as needed for flatulence.    [provider]  sucralfate  (CARAFATE ) 1 g tablet Take 1 tablet (1 g total) by mouth 4 (four) times daily -  with meals and at bedtime. Dissolve tablet in 1-2 tablespoons of water, then drink slurry. 09/07/23   Rudy Josette RAMAN, PA-C  traZODone  (DESYREL ) 50 MG tablet Take 1 tablet (50 mg total) by mouth at bedtime as needed for sleep. 08/12/23   Johnson, Clanford L, MD  UZEDY  50 MG/0.14ML SUSY Inject 50 mg into the skin every 14 (fourteen) days. 07/26/23   [provider]  valbenazine (INGREZZA) 60 MG capsule Take 60 mg by mouth daily.    [provider]     Family History  Problem Relation Age of Onset   Alcohol abuse Mother    Colon polyps Mother    Bipolar disorder Father    Paranoid behavior Father    Alcohol abuse Father    Bipolar disorder Sister    Anxiety  disorder Sister    Alcohol abuse Brother    Drug abuse Brother    Colon polyps Sibling    Schizophrenia Maternal Grandfather    Anxiety disorder Maternal Aunt    Bipolar disorder Paternal Aunt     Social History   Socioeconomic History   Marital status: Single    Spouse name: Not on file   Number of children: Not on file   Years of education: Not on file   Highest education level: Not on file  Occupational History   Not on file  Tobacco Use   Smoking status: Former    Current packs/day: 1.00    Average packs/day: 1 pack/day for 6.0 years (6.0 ttl pk-yrs)    Types: Cigarettes   Smokeless tobacco: Never  Vaping Use   Vaping status: Never Used  Substance and Sexual Activity   Alcohol use: No   Drug use: Yes    Types: Marijuana   Sexual activity: Not Currently  Other Topics Concern   Not on file  Social History Narrative   Not on file   Social Drivers of  Health   Financial Resource Strain: Not on file  Food Insecurity: No Food Insecurity (08/06/2023)   Hunger Vital Sign    Worried About Running Out of Food in the Last Year: Never true    Ran Out of Food in the Last Year: Never true  Transportation Needs: No Transportation Needs (08/06/2023)   PRAPARE - Administrator, Civil Service (Medical): No    Lack of Transportation (Non-Medical): No  Physical Activity: Not on file  Stress: Not on file  Social Connections: Unknown (05/30/2021)   Received from [facility]   Social Network    Social Network: Not on file     .  Review of Systems  Vital Signs:   Advance Care Plan: No documents on file  Physical Exam  Imaging: No results found.  Labs:  CBC: Recent Labs    08/11/23 0430 08/17/23 0111 08/22/23 1337 09/22/23 1926  WBC 5.6 6.3 6.1 7.5  HGB 10.7* 12.5 13.1 12.4  HCT 33.9* 38.7 39.4 39.4  PLT 172 291 299 243    COAGS: Recent Labs    08/05/23 1526 08/07/23 0823 08/09/23 0500  INR 1.0 1.1 1.1    BMP: Recent Labs     08/11/23 0430 08/12/23 0456 08/17/23 0111 08/22/23 1337  NA 138 143 139 137  K 4.1 4.3 4.1 4.1  CL 104 105 105 107  CO2 27 30 24  21*  GLUCOSE 99 98 108* 112*  BUN 13 13 15 20   CALCIUM 8.6* 8.2* 8.7* 8.7*  CREATININE 0.64 0.62 0.80 0.68  GFRNONAA >60 >60 >60 >60    LIVER FUNCTION TESTS: Recent Labs    08/12/23 0456 08/19/23 1546 08/22/23 1337 09/07/23 1143 09/28/23 1533  BILITOT 0.4 <0.2 0.8 0.5 0.3  AST 18 26 148* 247* 29  ALT 56* 26 67* 161* 30  ALKPHOS 118 147* 152*  --  108  PROT 4.8* 6.3 6.2* 6.1 5.8*  ALBUMIN 2.6* 4.3 3.5  --  4.0    TUMOR MARKERS: No results for input(s): AFPTM, CEA, CA199, CHROMGRNA in the last 8760 hours.  Assessment and Plan: 63 y.o. 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  She is scheduled today for image guided random core liver biopsy for further evaluation.Risks and benefits of procedure was discussed with the patient  including, but not limited to bleeding, infection, damage to adjacent structures or low yield requiring additional tests.  All of the questions were answered and there is agreement to proceed.  Consent signed and in chart.    Thank you for allowing our service to participate in Vicki Thomas 's care.  Electronically Signed: D. Franky Rakers, PA-C   10/18/2023, 5:05 PM      I spent a total of 25 minutes    in face to face in clinical consultation, greater than 50% of which was counseling/coordinating care for  image guided random core liver biopsy

## 2023-10-19 ENCOUNTER — Ambulatory Visit (HOSPITAL_COMMUNITY)

## 2023-10-19 ENCOUNTER — Ambulatory Visit (HOSPITAL_COMMUNITY)
Admission: RE | Admit: 2023-10-19 | Discharge: 2023-10-19 | Disposition: A | Discharge status: Home / Self Care | Source: Ambulatory Visit | Attending: Gastroenterology | Admitting: Gastroenterology

## 2023-10-19 ENCOUNTER — Encounter (HOSPITAL_COMMUNITY): Payer: Self-pay

## 2023-10-20 ENCOUNTER — Telehealth: Payer: Self-pay | Admitting: *Deleted

## 2023-10-20 ENCOUNTER — Encounter: Admitting: Gastroenterology

## 2023-10-20 NOTE — Telephone Encounter (Signed)
 Received message from Melissa X in scheduling that patient is cancelling biopsy and does not want to reschedule procedure. Josette wanted her to have it done. FYI

## 2023-10-21 NOTE — Telephone Encounter (Signed)
 Pt left vm stating that if liver biopsy is necessary that she wants to reschedule but if not's necessary she doesn't want to have it done. She said she had to cancel the one that was scheduled due to transportation issues. Please advise. Thank you

## 2023-10-26 ENCOUNTER — Other Ambulatory Visit: Payer: Self-pay

## 2023-10-26 DIAGNOSIS — F5104 Psychophysiologic insomnia: Secondary | ICD-10-CM

## 2023-10-27 ENCOUNTER — Ambulatory Visit: Admitting: Pulmonary Disease

## 2023-10-27 NOTE — Progress Notes (Signed)
 Attempted to contact the patient to discuss upcoming appointments and recommended health maintenance screenings for the year.  A Care Connections Specialist has reviewed the patient's chart to identify any gaps in care and outstanding preventive health needs. However, we were unable to reach the patient to review or schedule the necessary follow-up appointments and screenings.  Interpreter Services: Assistance used during this phone call. No  Left message: yes   MyChart message sent: yes   Voicemail was left including callback details and our hours of operation.  Comments:

## 2023-11-01 ENCOUNTER — Telehealth: Payer: Self-pay

## 2023-11-01 NOTE — Telephone Encounter (Signed)
 Copied from CRM [id number]. Topic: Clinical - Home Health Verbal Orders >> Nov 01, 2023  2:21 PM Darshell M wrote: Caller/Agency: [facility] Callback Number: [phone] Service Requested: Home Health Aid  Frequency: Patient said she is approved for 15 hours for a home health aid. Any new concerns about the patient? No - Patient call back #[phone]  Looks like this request was originally made in May but was not followed through. Patient was unable to remember who she spoke with.

## 2023-11-03 ENCOUNTER — Telehealth: Payer: Self-pay

## 2023-11-03 NOTE — Telephone Encounter (Signed)
 Copied from CRM #[id number]. Topic: General - Other >> Nov 03, 2023  1:45 PM Fonda T wrote: Reason for CRM: Received call from Andrea, Nurse with [employer], [phone].  Calling to check status of home health orders for patient.   Requesting a return call with status update, can be reached at office number, [phone], ok to leave message with office manager.

## 2023-11-04 ENCOUNTER — Other Ambulatory Visit: Payer: Self-pay

## 2023-11-04 DIAGNOSIS — F200 Paranoid schizophrenia: Secondary | ICD-10-CM

## 2023-11-04 DIAGNOSIS — M797 Fibromyalgia: Secondary | ICD-10-CM

## 2023-11-04 DIAGNOSIS — F4310 Post-traumatic stress disorder, unspecified: Secondary | ICD-10-CM

## 2023-11-04 DIAGNOSIS — F319 Bipolar disorder, unspecified: Secondary | ICD-10-CM

## 2023-11-07 ENCOUNTER — Other Ambulatory Visit: Payer: Self-pay

## 2023-11-07 DIAGNOSIS — M797 Fibromyalgia: Secondary | ICD-10-CM

## 2023-11-07 DIAGNOSIS — M792 Neuralgia and neuritis, unspecified: Secondary | ICD-10-CM

## 2023-11-07 DIAGNOSIS — G8929 Other chronic pain: Secondary | ICD-10-CM

## 2023-11-07 DIAGNOSIS — F4310 Post-traumatic stress disorder, unspecified: Secondary | ICD-10-CM

## 2023-11-09 ENCOUNTER — Encounter (INDEPENDENT_AMBULATORY_CARE_PROVIDER_SITE_OTHER): Payer: Self-pay | Admitting: Gastroenterology

## 2023-11-15 ENCOUNTER — Other Ambulatory Visit: Payer: Self-pay

## 2023-11-15 DIAGNOSIS — F200 Paranoid schizophrenia: Secondary | ICD-10-CM

## 2023-11-15 DIAGNOSIS — F4310 Post-traumatic stress disorder, unspecified: Secondary | ICD-10-CM

## 2023-11-18 ENCOUNTER — Other Ambulatory Visit: Payer: Self-pay

## 2023-11-18 ENCOUNTER — Telehealth: Payer: Self-pay | Admitting: *Deleted

## 2023-11-18 ENCOUNTER — Telehealth: Payer: Self-pay | Admitting: Cardiology

## 2023-11-18 ENCOUNTER — Ambulatory Visit: Payer: Self-pay

## 2023-11-18 MED ORDER — MIDODRINE HCL 5 MG PO TABS: 5.0000 mg | ORAL_TABLET | Freq: Three times a day (TID) | ORAL | 3 refills | Status: AC

## 2023-11-18 MED ORDER — DOXYCYCLINE HYCLATE 100 MG PO TABS: 100.0000 mg | ORAL_TABLET | Freq: Two times a day (BID) | ORAL | 0 refills | Status: AC

## 2023-11-18 NOTE — Telephone Encounter (Signed)
 Patient reports congestion, mild cough with sputum, sore throat and headache. Patient reports she has been using OTC medication for the last week with no improvement in symptoms. Patient is requesting an antibiotic. Asking for a follow up call from office.   FYI Only or Action Required?: Action required by provider: clinical question for provider.  Patient was last seen in primary care on 08/19/2023 by Antonetta Rollene BRAVO, MD.  Called Nurse Triage reporting Cough.  Symptoms began a week ago.  Interventions attempted: OTC medications: patient didn't specify and Rest, hydration, or home remedies.  Symptoms are: unchanged.  Triage Disposition: See Physician Within 24 Hours  Patient/caregiver understands and will follow disposition?: No, wishes to speak with PCP  Copied from CRM #[id number]. Topic: Clinical - Red Word Triage >> Nov 18, 2023 11:37 AM Joesph NOVAK wrote: Red Word that prompted transfer to Nurse Triage: Patient needs a antibiotic, she has a cold. Taking over the counter medicine and its not working. Congested, coughing, mucus, sore throat, headache. Reason for Disposition  [1] Continuous (nonstop) coughing interferes with work or school AND [2] no improvement using cough treatment per Care Advice  Answer Assessment - Initial Assessment Questions 1. ONSET: When did the cough begin?      Started a week ago 2. SEVERITY: How bad is the cough today?      mild 3. SPUTUM: Describe the color of your sputum (e.g., none, dry cough; clear, white, yellow, green)     clear 4. HEMOPTYSIS: Are you coughing up any blood? If Yes, ask: How much? (e.g., flecks, streaks, tablespoons, etc.)     no 5. DIFFICULTY BREATHING: Are you having difficulty breathing? If Yes, ask: How bad is it? (e.g., mild, moderate, severe)      no 6. FEVER: Do you have a fever? If Yes, ask: What is your temperature, how was it measured, and when did it start?     no 7. CARDIAC HISTORY: Do you have any  history of heart disease? (e.g., heart attack, congestive heart failure)      yes 8. LUNG HISTORY: Do you have any history of lung disease?  (e.g., pulmonary embolus, asthma, emphysema)     yes 9. PE RISK FACTORS: Do you have a history of blood clots? (or: recent major surgery, recent prolonged travel, bedridden)     no 10. OTHER SYMPTOMS: Do you have any other symptoms? (e.g., runny nose, wheezing, chest pain)       Congestion, sore throat, headache 12. TRAVEL: Have you traveled out of the country in the last month? (e.g., travel history, exposures)       no  Protocols used: Cough - Acute Productive-A-AH

## 2023-11-18 NOTE — Telephone Encounter (Signed)
 Pt needs a refill on pantoprazole  sent to pharmacy. Pt last OV 09/25/2023

## 2023-11-18 NOTE — Telephone Encounter (Signed)
*  STAT* If patient is at the pharmacy, call can be transferred to refill team.   1. Which medications need to be refilled? (please list name of each medication and dose if known) midodrine  (PROAMATINE ) 5 MG tablet   2. Which pharmacy/location (including street and city if local pharmacy) is medication to be sent to?  [facility] [place], [place] - [id number]      3. Do they need a 30 day or 90 day supply? 90 day

## 2023-11-18 NOTE — Telephone Encounter (Signed)
 RX sent in

## 2023-11-21 ENCOUNTER — Other Ambulatory Visit: Payer: Self-pay

## 2023-11-27 NOTE — Progress Notes (Deleted)
 GI Office Note    Referring Provider: Bevely Doffing, FNP Primary Care Physician:  Bevely Doffing, FNP Primary Gastroenterologist: Carlin POUR. Cindie, DO  Date:  11/27/2023  ID:  Vicki Thomas, DOB 1960-08-22, MRN [id number]   Chief Complaint   No chief complaint on file.  History of Present Illness  Vicki Thomas is a 63 y.o. female with a history of *** presenting today with complaint of   Seen by [facility] for bloating, constipation, elevated alk phos and dilated CBD. Retrieved from [facility] visit note: Upper endoscopy was performed on July 18, 2013 by Dr. 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 Small bowel biopsies did not show evidence of celiac disease, and gastric biopsies did not show evidence of Helicobacter pylori. She was found to have an isolated elevated alkaline phosphatase of 262 in addition to an elevated GGT of 283. Subsequent serologies including ANA, antimitochondrial antibody, and actin antibody were normal. MRCP was performed and revealed intra-and extrahepatic biliary ductal dilation in the post-cholecystectomy state. The common bile duct was as large as 16 mm. There were no stones. The pancreas appeared normal. Colonoscopy in 2013 but is unsure where that was performed. During this visit she reported taking Amitiza  for constipation which improved constipation and reported chronic opioid use but having 2-3 BM daily. Reported that she will regurgitate small amounts of emesis one to 2 times per day without warning. Taking nexium  daily. Labs ordered.   MRI 2015 IMPRESSION:  Intra-and extra hepatic biliary ductal dilatation with history of  cholecystectomy. No biliary stones. No change from prior CT   :   Small bowel follow through study July 2015 IMPRESSION:  Unremarkable small bowel follow-through series.  Last office visit with me 06/16/2023.***. Scheduled for EGD and colonoscopy. Start linzess  145 mcg daily.  Use sitz bats and otc hemorrhoid cream. Continue tums as needed. May be a good banding candidate.   Admitted to [facility] 7/11-7/18/25. ***   Seen by Josette Centers, PA-C 09/07/23. *** Labs ordered. Increase PPI to BID, start carafate  QID, zofran  as needed. Start amitiza  8 mcg Bid, add benefiber, advised resuming miralax . Anusol  suppositories as needed. Keep appt with Dr. Kallie to discuss hemorrhoid issues.   Dr Kallie reached out 09/13/23 to let us  know patient decided to pursue hemorrhoid banding.   09/22/23 - see telephone note thread given recs after obtaining liver serologies. Alpha-1 antitrypsin serum low at 73, phenotype heterozygous MZ. 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 Liver biopsy recommended after discussion with dr cindie on 09/29/23.  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   Patient cancelled prior liver biopsy procedure.   Today:     Wt Readings from Last 6 Encounters:  09/28/23 186 lb (84.4 kg)  09/22/23 181 lb (82.1 kg)  09/13/23 182 lb (82.6 kg)  09/07/23 178 lb (80.7 kg)  08/22/23 171 lb (77.6 kg)  08/19/23 171 lb 0.6 oz (77.6 kg)    There is no height or weight on file to calculate BMI.   Current Outpatient Medications  Medication Sig Dispense Refill   aluminum -magnesium  hydroxide 200-200 MG/5ML suspension Take 15 mLs by mouth every 8 (eight) hours as needed for indigestion. 355 mL 0   benztropine  (COGENTIN ) 0.5 MG tablet Take 0.5 mg by mouth daily.     bisacodyl  (DULCOLAX) 5 MG EC tablet Take 5 mg by mouth daily as needed  for moderate constipation.     Blood Glucose Monitoring Suppl DEVI 1 each by Does not apply route in the morning, at noon, and at bedtime. May substitute to any manufacturer covered by patient's insurance. 1 each 0   busPIRone  (BUSPAR ) 7.5 MG tablet Take 1 tablet (7.5 mg total) by mouth 3 (three) times daily. 90 tablet 2   calcium carbonate (TUMS  EX) 750 MG chewable tablet Chew 1 tablet by mouth daily as needed for heartburn.     clotrimazole  (GYNE-LOTRIMIN ) 1 % vaginal cream Place 1 Applicatorful vaginally at bedtime. 45 g 0   Glucose Blood (BLOOD GLUCOSE TEST STRIPS) STRP Accu chek guide meter strips. Three times daily testing DX e11.65 100 strip 3   hydrocortisone  (ANUSOL -HC) 2.5 % rectal cream Place 1 Application rectally 2 (two) times daily. 30 g 1   lubiprostone  (AMITIZA ) 8 MCG capsule Take 1 capsule (8 mcg total) by mouth 2 (two) times daily with a meal. 60 capsule 3   meclizine  (ANTIVERT ) 25 MG tablet Take 1 tablet (25 mg total) by mouth 3 (three) times daily as needed for dizziness.     midodrine  (PROAMATINE ) 5 MG tablet Take 1 tablet (5 mg total) by mouth 3 (three) times daily with meals. 270 tablet 3   ondansetron  (ZOFRAN ) 4 MG tablet Take 4 mg by mouth 2 (two) times daily as needed.     polyethylene glycol (MIRALAX  / GLYCOLAX ) 17 g packet Take 17 g by mouth daily. 30 each 1   potassium chloride  (KLOR-CON ) 10 MEQ tablet Take 1 tablet (10 mEq total) by mouth daily as needed (pot drops, muscle spasms).     RABEprazole  (ACIPHEX ) 20 MG tablet Take 1 tablet (20 mg total) by mouth 2 (two) times daily before a meal. 60 tablet 3   simethicone  (MYLICON) 125 MG chewable tablet Chew 125 mg by mouth every 6 (six) hours as needed for flatulence.     sucralfate  (CARAFATE ) 1 g tablet Take 1 tablet (1 g total) by mouth 4 (four) times daily -  with meals and at bedtime. Dissolve tablet in 1-2 tablespoons of water, then drink slurry. 90 tablet 2   traZODone  (DESYREL ) 50 MG tablet TAKE 1-2 TABLETS BY MOUTH AT BEDTIME AS NEEDED FOR SLEEP 60 tablet 5   UZEDY  50 MG/0.14ML SUSY Inject 50 mg into the skin every 14 (fourteen) days.     valbenazine (INGREZZA) 60 MG capsule Take 60 mg by mouth daily.     No current facility-administered medications for this visit.    Past Medical History:  Diagnosis Date   Anxiety    Arthritis    Bipolar 1 disorder  (HCC)    Chronic back pain    Chronic fatigue    Chronic headaches    Chronic pain    Fibromyalgia    Hiatal hernia    History of borderline personality disorder    Interstitial cystitis    Lymphedema    Panic disorder with agoraphobia    PTSD (post-traumatic stress disorder)     Past Surgical History:  Procedure Laterality Date   ABDOMINAL HYSTERECTOMY     APPENDECTOMY     BARIATRIC SURGERY     CHOLECYSTECTOMY     COLONOSCOPY N/A 08/09/2023   Procedure: COLONOSCOPY;  Surgeon: Eartha Angelia Sieving, MD;  Location: [facility];  Service: Gastroenterology;  Laterality: N/A;   ESOPHAGOGASTRODUODENOSCOPY N/A 08/09/2023   Procedure: EGD (ESOPHAGOGASTRODUODENOSCOPY);  Surgeon: Eartha Angelia, Sieving, MD;  Location: [facility];  Service: Gastroenterology;  Laterality: N/A;   ESOPHAGUS SURGERY     FOOT SURGERY     HEMORRHOID SURGERY     HERNIA REPAIR     TONSILLECTOMY      Family History  Problem Relation Age of Onset   Alcohol abuse Mother    Colon polyps Mother    Bipolar disorder Father    Paranoid behavior Father    Alcohol abuse Father    Bipolar disorder Sister    Anxiety disorder Sister    Alcohol abuse Brother    Drug abuse Brother    Colon polyps Sibling    Schizophrenia Maternal Grandfather    Anxiety disorder Maternal Aunt    Bipolar disorder Paternal Aunt     Allergies as of 11/29/2023 - Review Complete 09/28/2023  Allergen Reaction Noted   Bee venom Swelling 02/15/2011   Brexpiprazole  Other (See Comments) 03/01/2014   Erythromycin Shortness Of Breath 02/15/2011   Penicillins Shortness Of Breath 02/15/2011   Abilify [aripiprazole] Other (See Comments) 12/17/2011   Aripiprazole Other (See Comments) 02/15/2011   Ciprocin-fluocin-procin [fluocinolone acetonide] Other (See Comments) 02/15/2011   Fexofenadine hcl Other (See Comments) 02/15/2011   Fluoxetine hcl Other (See Comments) 10/31/2012   Gabapentin Other (See Comments) 11/13/2012    Haloperidol decanoate Other (See Comments) 10/31/2012   Iodine Other (See Comments) 02/15/2011   Lamotrigine Other (See Comments) 01/14/2015   Lithium  Other (See Comments) 02/15/2011   Lurasidone hcl Other (See Comments) 12/19/2013   Marcaine [bupivacaine hcl] Other (See Comments) 12/17/2011   Mirtazapine Other (See Comments) 02/15/2011   Paroxetine hcl Other (See Comments) 02/15/2011   Pregabalin Other (See Comments) 02/15/2011   Quetiapine fumarate Swelling 10/31/2012   Seroquel [quetiapine fumarate] Other (See Comments) 12/17/2011   Tape Other (See Comments) 02/15/2011   Zonisamide Other (See Comments) 02/15/2011    Social History   Socioeconomic History   Marital status: Single    Spouse name: Not on file   Number of children: Not on file   Years of education: Not on file   Highest education level: Not on file  Occupational History   Not on file  Tobacco Use   Smoking status: Former    Current packs/day: 1.00    Average packs/day: 1 pack/day for 6.0 years (6.0 ttl pk-yrs)    Types: Cigarettes   Smokeless tobacco: Never  Vaping Use   Vaping status: Never Used  Substance and Sexual Activity   Alcohol use: No   Drug use: Yes    Types: Marijuana   Sexual activity: Not Currently  Other Topics Concern   Not on file  Social History Narrative   Not on file   Social Drivers of Health   Financial Resource Strain: Not on file  Food Insecurity: No Food Insecurity (08/06/2023)   Hunger Vital Sign    Worried About Running Out of Food in the Last Year: Never true    Ran Out of Food in the Last Year: Never true  Transportation Needs: No Transportation Needs (08/06/2023)   PRAPARE - Administrator, Civil Service (Medical): No    Lack of Transportation (Non-Medical): No  Physical Activity: Not on file  Stress: Not on file  Social Connections: Unknown (05/30/2021)   [facility]   Social Network    Social Network: Not on file    Review of Systems    Gen: Denies fever, chills, anorexia. Denies fatigue, weakness, weight loss.  CV: Denies chest pain, palpitations, syncope, peripheral edema, and claudication. Resp:  Denies dyspnea at rest, cough, wheezing, coughing up blood, and pleurisy. GI: See HPI Derm: Denies rash, itching, dry skin Psych: Denies depression, anxiety, memory loss, confusion. No homicidal or suicidal ideation.  Heme: Denies bruising, bleeding, and enlarged lymph nodes.  Physical Exam   There were no vitals taken for this visit.  General:   Alert and oriented. No distress noted. Pleasant and cooperative.  Head:  Normocephalic and atraumatic. Eyes:  Conjuctiva clear without scleral icterus. Mouth:  Oral mucosa pink and moist. Good dentition. No lesions. Lungs:  Clear to auscultation bilaterally. No wheezes, rales, or rhonchi. No distress.  Heart:  S1, S2 present without murmurs appreciated.  Abdomen:  +BS, soft, non-tender and non-distended. No rebound or guarding. No HSM or masses noted. Rectal: *** Msk:  Symmetrical without gross deformities. Normal posture. Extremities:  Without edema. Neurologic:  Alert and  oriented x4 Psych:  Alert and cooperative. Normal mood and affect.  Assessment & Plan  Vicki Thomas is a 63 y.o. female presenting today with ***   Follow up   ***Follow up ***    Charmaine Melia, MSN, FNP-BC, AGACNP-BC [facility]

## 2023-11-28 ENCOUNTER — Ambulatory Visit: Admitting: Gastroenterology

## 2023-11-29 ENCOUNTER — Ambulatory Visit: Admitting: Gastroenterology

## 2023-11-30 ENCOUNTER — Encounter: Payer: Self-pay | Admitting: Gastroenterology

## 2023-11-30 NOTE — Telephone Encounter (Signed)
 Spoke to pt, she informed me that she is taking both rabeprazole  and pantoprazole , because she has heart burn so back. She also wants to know if she can do a virtual visits do to transportation issues.

## 2023-12-07 NOTE — Telephone Encounter (Signed)
 Spoke to pt, informed her of recommendations. Pt states that she is having issues with transportation. She can not make to the office. She stated that her rectal bleeding is better since taking the Amitiza . She states once she gets her transportation issues straight she will call and get and OV.

## 2023-12-25 ENCOUNTER — Encounter: Payer: Self-pay | Admitting: Nurse Practitioner

## 2023-12-25 NOTE — Progress Notes (Deleted)
 Cardiology Office Note:  .   Date:  12/25/2023  ID:  Vicki Thomas, DOB 04-15-1960, MRN [id number] PCP: Bevely Doffing, FNP  [facility] Providers Cardiologist:  Alvan Carrier, MD {  History of Present Illness: Vicki   CURLIE Thomas is a 63 y.o. female  with PMHx of atypical chest pain (Lexiscan  08/11/2023: normal study), palpations (Zio 7/23-08/29/2023: predominately NSR, avg HR 69, HR range 39-138, < 1% PAC/PVC's), anxiety, schizophrenia, PTSD  who reports to [facility] office for follow up.   Last seen in heartcare 09/28/2023 by myself for hospital follow up related to atypical chest pain.  Reported ongoing atypical chest pain that occurs most consistently when laying down and waking her up that has increased in frequency. Associated with SOB. Suspected secondary to GI since symptoms consistent with laying down and associated with abdominal pain.  Per patient, she was following with GI and awaiting further GI workup testing. Per chart review, liver biopsy was canceled due to transportation issues. GI also noted not recommended to take both pantoprazole  and Rabeprazole  with recommendation to trial Voquenza.   Today, reports ### and denies ###.  Denies chest pain, shortness of breath, palpitations, syncope, presyncope, dizziness, orthopnea, PND, swelling or significant weight changes, acute bleeding, or claudication.   Reports compliance with medications.  Dietary habitats:  Activity level:  Social: Denies tobacco use/alcohol/drug use  Denies any recent hospitalizations or visits to the emergency department  Atypical Chest pain Recent hospitalization 7/11-18/2025.  Determined noncardiac chest pain with negative troponin, EKG without ischemic changes, normal echo, atypical presentation, normal lexiscan    Reports ongoing atypical chest pain that occurs most consistently when laying down and waking her up that has increased in frequency.  Associated with SOB. Based on previous ischemic  workup and presentation, less concern for ACS.  Suspect secondary to GI since symptoms consistent with laying down and associated with abdominal pain.  Per patient, she is still following with GI and awaiting further GI workup testing.  Can reassess based on GI results.  If chest pain still persists on next OV then would consider discussing cardiac CTA. Would avoid Imdur at this time with low BP.    Hypotension Dizziness Recent hospitalization 7/11-18/2025 complicated by hypotension. D/C on midodrine  5 mg TID.  BP at home reported as 100s/60s BP in office today 108/68 Reports chronic unchanged dizziness that is relieved with meclizine . Continue midodrine  5 mg 3 times daily   PVC  Palpitations  Reported palpitations in correlation to chest pain as above in 07/2023 hospitalization.  Reviewed EKG showed normal sinus bradycardia, HR 51 with PVC, unchanged nonspecific ST changes.  Zio 7/23-08/29/2023: predominately NSR, avg HR 69, HR range 39-138, < 1% PAC/PVC's Denies any palpitations   Transaminitis Elevated LFT 07/2023 that was thought secondary to transient hypotension Not on any statin medications Undergoing GI workup as above.     Dispo: F/U in 3 m/o with JB or SD.   ROS: 10 point review of system has been reviewed and considered negative except ones been listed in the HPI.   Studies Reviewed: Vicki   ECHO 08/07/2023 IMPRESSIONS   1. Left ventricular ejection fraction, by estimation, is 55 to 60%. The  left ventricle has normal function. The left ventricle has no regional  wall motion abnormalities. Left ventricular diastolic parameters were  normal.   2. Right ventricular systolic function is normal. The right ventricular  size is normal. There is mildly elevated pulmonary artery systolic  pressure. The estimated right ventricular  systolic pressure is 36.9 mmHg.   3. The mitral valve is normal in structure. Trivial mitral valve  regurgitation. No evidence of mitral stenosis.   4. The  aortic valve is grossly normal. There is mild calcification of the  aortic valve. Aortic valve regurgitation is not visualized. No aortic  stenosis is present.   5. The inferior vena cava is dilated in size with <50% respiratory  variability, suggesting right atrial pressure of 15 mmHg.    Lexiscan  Stress Test 07/2023 Narrative & Impression      The study is normal. There are no perfusion defects consistent with prior infarct or current ischemia.  The study is low risk.   No ST deviation was noted.   Left ventricular function is normal. Nuclear stress EF: 61%. The left ventricular ejection fraction is normal (55-65%). End diastolic cavity size is normal.   There is a moderate size mild to moderate intensity inferior defect most intense in the resting images with normal wall motion. Finding is consistent with artifact due to adjacent gut radiotracer uptake.    Zio 7/23-08/29/2023   11 day monitor   Rare supraventricular ectopy in the form of isolated PACs, couplets   Rare ventricular ectopy in the form of isolated PVCs, couplets   Patient triggered events but no diary included. Events correlate with normal sinus rhythm   No significant arrhythmias   Patch Wear Time:  11 days and 7 hours (2025-07-23T18:56:46-0400 to 2025-08-04T01:59:17-0400)   Patient had a min HR of 39 bpm, max HR of 138 bpm, and avg HR of 69 bpm. Predominant underlying rhythm was Sinus Rhythm. Isolated SVEs were rare (<1.0%), SVE Couplets were rare (<1.0%), and no SVE Triplets were present. Isolated VEs were rare (<1.0%), VE  Couplets were rare (<1.0%), and no VE Triplets were present. Ventricular Bigeminy was present.  Risk Assessment/Calculations:   {Does this patient have ATRIAL FIBRILLATION?[phone]} No BP recorded.  {Refresh Note OR Click here to enter BP  :1}***       Physical Exam:   VS:  There were no vitals taken for this visit.   Wt Readings from Last 3 Encounters:  09/28/23 186 lb (84.4 kg)  09/22/23 181  lb (82.1 kg)  09/13/23 182 lb (82.6 kg)    GEN: Well nourished, well developed in no acute distress while sitting in chair.  NECK: No JVD; No carotid bruits CARDIAC: ***RRR, no murmurs, rubs, gallops RESPIRATORY:  Clear to auscultation without rales, wheezing or rhonchi  ABDOMEN: Soft, non-tender, non-distended EXTREMITIES:  No edema; No deformity   ASSESSMENT AND PLAN: .   ***    {Are you ordering a CV Procedure (e.g. stress test, cath, DCCV, TEE, etc)?   Press F2        :[id number]}  Dispo: ***  Signed, Lorette CINDERELLA Kapur, PA-C

## 2023-12-26 ENCOUNTER — Ambulatory Visit (INDEPENDENT_AMBULATORY_CARE_PROVIDER_SITE_OTHER): Admitting: Nurse Practitioner

## 2023-12-26 ENCOUNTER — Encounter: Payer: Self-pay | Admitting: Nurse Practitioner

## 2023-12-26 ENCOUNTER — Ambulatory Visit: Attending: Physician Assistant | Admitting: Physician Assistant

## 2023-12-26 VITALS — BP 118/70 | HR 82 | Ht 68.0 in | Wt 202.0 lb

## 2023-12-26 DIAGNOSIS — E162 Hypoglycemia, unspecified: Secondary | ICD-10-CM | POA: Diagnosis not present

## 2023-12-26 LAB — POCT GLYCOSYLATED HEMOGLOBIN (HGB A1C): Hemoglobin A1C: 6.2 % — AB (ref 4.0–5.6)

## 2023-12-26 MED ORDER — DEXCOM G7 SENSOR MISC: 1.0000 | 3 refills | Status: AC

## 2023-12-26 NOTE — Telephone Encounter (Signed)
 See message, I had forwarded you a different one saying she wouldn't be able to make it, but it appears she has worked it out.

## 2023-12-26 NOTE — Progress Notes (Signed)
 Endocrinology Consult Note                                            12/26/2023, 11:29 AM   Subjective:    Patient ID: Vicki Thomas, female    DOB: 09/30/1960, PCP Bevely Doffing, FNP   Past Medical History:  Diagnosis Date   Anxiety    Arthritis    Bipolar 1 disorder (HCC)    Chronic back pain    Chronic fatigue    Chronic headaches    Chronic pain    Fibromyalgia    Hiatal hernia    History of borderline personality disorder    Interstitial cystitis    Lymphedema    Panic disorder with agoraphobia    PTSD (post-traumatic stress disorder)    Past Surgical History:  Procedure Laterality Date   ABDOMINAL HYSTERECTOMY     APPENDECTOMY     BARIATRIC SURGERY     CHOLECYSTECTOMY     COLONOSCOPY N/A 08/09/2023   Procedure: COLONOSCOPY;  Surgeon: Eartha Angelia Sieving, MD;  Location: [facility];  Service: Gastroenterology;  Laterality: N/A;   ESOPHAGOGASTRODUODENOSCOPY N/A 08/09/2023   Procedure: EGD (ESOPHAGOGASTRODUODENOSCOPY);  Surgeon: Eartha Angelia, Sieving, MD;  Location: [facility];  Service: Gastroenterology;  Laterality: N/A;   ESOPHAGUS SURGERY     FOOT SURGERY     HEMORRHOID SURGERY     HERNIA REPAIR     TONSILLECTOMY     Social History   Socioeconomic History   Marital status: Single    Spouse name: Not on file   Number of children: Not on file   Years of education: Not on file   Highest education level: Not on file  Occupational History   Not on file  Tobacco Use   Smoking status: Former    Current packs/day: 1.00    Average packs/day: 1 pack/day for 6.0 years (6.0 ttl pk-yrs)    Types: Cigarettes   Smokeless tobacco: Never  Vaping Use   Vaping status: Never Used  Substance and Sexual Activity   Alcohol use: No   Drug use: Yes    Types: Marijuana   Sexual activity: Not Currently  Other Topics Concern   Not on file  Social History Narrative   Not on file   Social Drivers of Health   Financial Resource Strain: Not on  file  Food Insecurity: No Food Insecurity (08/06/2023)   Hunger Vital Sign    Worried About Running Out of Food in the Last Year: Never true    Ran Out of Food in the Last Year: Never true  Transportation Needs: No Transportation Needs (08/06/2023)   PRAPARE - Administrator, Civil Service (Medical): No    Lack of Transportation (Non-Medical): No  Physical Activity: Not on file  Stress: Not on file  Social Connections: Unknown (05/30/2021)   [facility]   Social Network    Social Network: Not on file   Family History  Problem Relation Age of Onset   Alcohol abuse Mother    Colon polyps Mother    Bipolar disorder Father    Paranoid behavior Father    Alcohol abuse Father    Bipolar disorder Sister    Anxiety disorder Sister    Alcohol abuse Brother    Drug abuse Brother    Colon polyps  Sibling    Schizophrenia Maternal Grandfather    Anxiety disorder Maternal Aunt    Bipolar disorder Paternal Aunt    Outpatient Encounter Medications as of 12/26/2023  Medication Sig   aluminum -magnesium  hydroxide 200-200 MG/5ML suspension Take 15 mLs by mouth every 8 (eight) hours as needed for indigestion.   benztropine  (COGENTIN ) 0.5 MG tablet Take 0.5 mg by mouth daily.   bisacodyl  (DULCOLAX) 5 MG EC tablet Take 5 mg by mouth daily as needed for moderate constipation.   Blood Glucose Monitoring Suppl DEVI 1 each by Does not apply route in the morning, at noon, and at bedtime. May substitute to any manufacturer covered by patient's insurance.   busPIRone  (BUSPAR ) 7.5 MG tablet Take 1 tablet (7.5 mg total) by mouth 3 (three) times daily.   calcium carbonate (TUMS EX) 750 MG chewable tablet Chew 1 tablet by mouth daily as needed for heartburn.   clotrimazole  (GYNE-LOTRIMIN ) 1 % vaginal cream Place 1 Applicatorful vaginally at bedtime.   Continuous Glucose Sensor (DEXCOM G7 SENSOR) MISC Inject 1 Application into the skin as directed. Change sensor every 10 days as directed.    Glucose Blood (BLOOD GLUCOSE TEST STRIPS) STRP Accu chek guide meter strips. Three times daily testing DX e11.65   hydrocortisone  (ANUSOL -HC) 2.5 % rectal cream Place 1 Application rectally 2 (two) times daily.   lubiprostone  (AMITIZA ) 8 MCG capsule Take 1 capsule (8 mcg total) by mouth 2 (two) times daily with a meal.   meclizine  (ANTIVERT ) 25 MG tablet Take 1 tablet (25 mg total) by mouth 3 (three) times daily as needed for dizziness.   midodrine  (PROAMATINE ) 5 MG tablet Take 1 tablet (5 mg total) by mouth 3 (three) times daily with meals.   ondansetron  (ZOFRAN ) 4 MG tablet Take 4 mg by mouth 2 (two) times daily as needed.   polyethylene glycol (MIRALAX  / GLYCOLAX ) 17 g packet Take 17 g by mouth daily.   potassium chloride  (KLOR-CON ) 10 MEQ tablet Take 1 tablet (10 mEq total) by mouth daily as needed (pot drops, muscle spasms).   RABEprazole  (ACIPHEX ) 20 MG tablet Take 1 tablet (20 mg total) by mouth 2 (two) times daily before a meal.   simethicone  (MYLICON) 125 MG chewable tablet Chew 125 mg by mouth every 6 (six) hours as needed for flatulence.   sucralfate  (CARAFATE ) 1 g tablet Take 1 tablet (1 g total) by mouth 4 (four) times daily -  with meals and at bedtime. Dissolve tablet in 1-2 tablespoons of water, then drink slurry.   traZODone  (DESYREL ) 50 MG tablet TAKE 1-2 TABLETS BY MOUTH AT BEDTIME AS NEEDED FOR SLEEP   UZEDY  50 MG/0.14ML SUSY Inject 50 mg into the skin every 14 (fourteen) days.   valbenazine (INGREZZA) 60 MG capsule Take 60 mg by mouth daily.   No facility-administered encounter medications on file as of 12/26/2023.   ALLERGIES: Allergies  Allergen Reactions   Bee Venom Swelling    Requires Epipen   Brexpiprazole  Other (See Comments)    Severe head pain   Erythromycin Shortness Of Breath    Throat closes, flushing    Penicillins Shortness Of Breath    Throat closes, flushing   Abilify [Aripiprazole] Other (See Comments)    Unknown    Aripiprazole Other (See  Comments)    Unknown   Ciprocin-Fluocin-Procin [Fluocinolone Acetonide] Other (See Comments)    Unknown   Fexofenadine Hcl Other (See Comments)    Unknown    Fluoxetine Hcl Other (See Comments)  Aggression   Gabapentin Other (See Comments)    Dizziness and excessive falling   Haloperidol Decanoate Other (See Comments)    Facial mask symptoms   Iodine Other (See Comments)    Unknown    Lamotrigine Other (See Comments)    Vertigo Increased falls   Lithium  Other (See Comments)    Unknown    Lurasidone Hcl Other (See Comments)    Feels like her head is going to explode Dizziness   Marcaine [Bupivacaine Hcl] Other (See Comments)    Unknown    Mirtazapine Other (See Comments)    Dizziness, Blurred vision   Paroxetine Hcl Other (See Comments)    Unknown    Pregabalin Other (See Comments)    Unknown    Quetiapine Fumarate Swelling   Seroquel [Quetiapine Fumarate] Other (See Comments)    Unknown    Tape Other (See Comments)    Unknown    Zonisamide Other (See Comments)    Unknown     VACCINATION STATUS: Immunization History  Administered Date(s) Administered   Influenza,inj,Quad PF,6+ Mos 01/20/2014   Moderna SARS-COV2 Booster Vaccination 02/19/2020   Moderna Sars-Covid-2 Vaccination 07/03/2019, 07/31/2019    HPI Vicki Thomas is 63 y.o. female who presents today with a medical history as above. she is being seen in consultation for hypoglycemia requested by Bevely Doffing, FNP.  she has been dealing with symptoms of glucose dropping (sweatiness, forgetful, jitteriness) intermittently for about 2 years, however she notes it has been worsening.  She reports glucose as low as 33 in the past.  I did review her glucose meter and there are quite a few 50's-60's as well as high sugars of over 268.  She has history of gastric bypass surgery with nissen fundoplication which forces her to eat small meals more frequently throughout the day.  She even reports having to get up and  eat in the middle of the night to help prevent sugar from dropping.  She drinks water, pepsi, and hot tea (with honey).  She grazes on foods throughout the day, trying to avoid processed foods.  She is on Uzedy  for her schizophrenia and will take occasional risperidone  orally for breakthrough episodes.  These medications do have tendency to elevate glucose levels.  She started on these about a year ago.  She notes she has tried all the other medications to treat her schizophrenia but was allergic to the other options.  She does have transportation issues.  She relies on her family to bring her to doctors appointments.  She did use healthcare transportation services previously but due to her agoraphobia, could not continue.   Review of systems  Constitutional: + Minimally fluctuating body weight,  current Body mass index is 30.71 kg/m. , no fatigue, no subjective hyperthermia, no subjective hypothermia Eyes: no blurry vision, no xerophthalmia ENT: no sore throat, no nodules palpated in throat, no dysphagia/odynophagia, no hoarseness Cardiovascular: no chest pain, no shortness of breath, no palpitations, no leg swelling Respiratory: no cough, no shortness of breath Gastrointestinal: no nausea/vomiting/diarrhea Musculoskeletal: no muscle/joint aches Skin: no rashes, no hyperemia Neurological: no tremors, no numbness, no tingling, no dizziness Psychiatric: no depression, no anxiety  Objective:    BP Readings from Last 3 Encounters:  12/26/23 118/70  09/28/23 108/68  09/22/23 124/64    BP 118/70 (BP Location: Left Arm, Patient Position: Sitting, Cuff Size: Large)   Pulse 82   Ht 5' 8 (1.727 m)   Wt 202 lb (91.6 kg)   BMI  30.71 kg/m   Wt Readings from Last 3 Encounters:  12/26/23 202 lb (91.6 kg)  09/28/23 186 lb (84.4 kg)  09/22/23 181 lb (82.1 kg)     Physical Exam- Limited  Constitutional:  Body mass index is 30.71 kg/m. , not in acute distress, normal state of mind Eyes:   EOMI, no exophthalmos Neck: Supple Cardiovascular: RRR, no murmurs, rubs, or gallops, no edema Respiratory: Adequate breathing efforts, no crackles, rales, rhonchi, or wheezing Musculoskeletal: no gross deformities, strength intact in all four extremities, no gross restriction of joint movements Skin:  no rashes, no hyperemia Neurological: no tremor with outstretched hands  CMP ( most recent) CMP     Component Value Date/Time   NA 137 08/22/2023 1337   NA 141 07/15/2023 1210   NA 141 07/03/2011 1942   K 4.1 08/22/2023 1337   K 3.8 07/03/2011 1942   CL 107 08/22/2023 1337   CL 110 (H) 07/03/2011 1942   CO2 21 (L) 08/22/2023 1337   CO2 25 07/03/2011 1942   GLUCOSE 112 (H) 08/22/2023 1337   GLUCOSE 82 07/03/2011 1942   BUN 20 08/22/2023 1337   BUN 13 07/15/2023 1210   BUN 9 07/03/2011 1942   CREATININE 0.68 08/22/2023 1337   CREATININE 0.79 07/03/2011 1942   CALCIUM 8.7 (L) 08/22/2023 1337   CALCIUM 8.0 (L) 07/03/2011 1942   PROT 5.8 (L) 09/28/2023 1533   PROT 6.2 (L) 07/03/2011 1942   ALBUMIN 4.0 09/28/2023 1533   ALBUMIN 3.3 (L) 07/03/2011 1942   AST 29 09/28/2023 1533   AST 22 07/03/2011 1942   ALT 30 09/28/2023 1533   ALT 27 07/03/2011 1942   ALKPHOS 108 09/28/2023 1533   ALKPHOS 184 (H) 07/03/2011 1942   BILITOT 0.3 09/28/2023 1533   BILITOT 0.2 07/03/2011 1942   EGFR 94 07/15/2023 1210   GFRNONAA >60 08/22/2023 1337   GFRNONAA >60 07/03/2011 1942     Diabetic Labs (most recent): Lab Results  Component Value Date   HGBA1C 6.2 (A) 12/26/2023   HGBA1C 5.0 05/13/2023     Lipid Panel ( most recent) Lipid Panel     Component Value Date/Time   CHOL 124 05/13/2023 0642   TRIG 64 05/13/2023 0642   HDL 59 05/13/2023 0642   CHOLHDL 2.1 05/13/2023 0642   VLDL 13 05/13/2023 0642   LDLCALC 52 05/13/2023 0642      Lab Results  Component Value Date   TSH 3.190 08/08/2023   TSH 2.704 01/15/2014   TSH 0.887 07/03/2011   TSH 5.95 (H) 04/16/2011        MRCP  results from 08/05/23 Narrative & Impression  CLINICAL DATA:  Right upper quadrant abdominal pain, common bile duct dilatation identified by ultrasound, status post cholecystectomy   EXAM: MRI ABDOMEN WITHOUT AND WITH CONTRAST (INCLUDING MRCP)   TECHNIQUE: Multiplanar multisequence MR imaging of the abdomen was performed both before and after the administration of intravenous contrast. Heavily T2-weighted images of the biliary and pancreatic ducts were obtained, and three-dimensional MRCP images were rendered by post processing.   CONTRAST:  7.5mL GADAVIST  GADOBUTROL  1 MMOL/ML IV SOLN   COMPARISON:  Same-day right upper quadrant ultrasound, CT abdomen pelvis, 12/17/2011   FINDINGS: Lower chest: No acute abnormality.   Hepatobiliary: No focal liver abnormality is seen. Status post cholecystectomy. Intra and extrahepatic biliary ductal dilatation, the common bile duct measuring up to 1.4 cm in caliber. This is unchanged when compared to remote prior CT dated 12/17/2011, in the duct  generally tapers to the ampulla without calculus or other obstruction (series 30, image 19).   Pancreas: Mild prominence of the pancreatic duct along its length to the ampulla, measuring up to 0.4 cm (series 32, image 21). No inflammatory findings.   Spleen: Normal in size without significant abnormality.   Adrenals/Urinary Tract: Adrenal glands are unremarkable. Kidneys are normal, without obvious renal calculi, solid lesion, or hydronephrosis.   Stomach/Bowel: Roux gastric bypass. No evidence of bowel wall thickening, distention, or inflammatory changes. Large burden of stool throughout the colon.   Vascular/Lymphatic: No significant vascular findings are present. No enlarged abdominal lymph nodes.   Other: No abdominal wall hernia or abnormality. No ascites.   Musculoskeletal: No acute or significant osseous findings.   IMPRESSION: 1. Intra and extrahepatic biliary ductal dilatation,  the common bile duct measuring up to 1.4 cm in caliber. This is unchanged when compared to remote prior CT dated 12/17/2011, and the duct generally tapers to the ampulla without calculus or other obstruction. 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 2. Mild prominence of the pancreatic duct along its length to the ampulla, measuring up to 0.4 cm. This is of uncertain significance, without acute inflammatory findings and as above is similar to prior examination dated 2013. 3. Roux gastric bypass. 4. Large burden of stool throughout the colon.     Electronically Signed   By: Marolyn JONETTA Jaksch M.D.   On: 08/05/2023 21:34      Assessment & Plan:   1. Hypoglycemia (Primary) 2. Prediabetes  - Vicki Thomas  is being seen at a kind request of Bevely Doffing, FNP. - I have reviewed her available records and clinically evaluated the patient.  - Based on these reviews, she has reactive hypoglycemia (possibly exacerbated from her Uzedy  and Risperidone  use),  however, there is not sufficient information to proceed with definitive treatment plan.  She notes glucose as low as 32.  She does have history of gastric bypass with Nissen fundoplication.  She did have A1c checked which was 6.2%, increasing from her April value of 5%.  She admits she has been eating more candy recently to help bring up her significant low sugar readings.  -Will check insulin and cpeptide levels.  Will also recommend she continue wearing CGM to help determine timing and more details surrounding each event.  I did give her sample and my nurse assisted her in setting it up and applying today.  I have also asked that she keep a detailed food diary and bring with her to next appointment so we can look for triggering foods.  Not concerned about insulinoma at this time as recent imaging shows no pancreatic abnormality.  - I did not initiate any new prescriptions today.  She may possibly benefit from  Acarbose or GLP1 in the future (however getting GLP1 approved for anything other than DM will be difficult).  She was previously on Metformin to help manage glucose prior to her gastric bypass surgery.  She did not tolerate that medication very well.    We did go over proper diet today. The following Lifestyle Medicine recommendations according to American College of Lifestyle Medicine [facility]) were discussed and offered to patient and she agrees to start the journey:  A. Whole Foods, Plant-based plate comprising of fruits and vegetables, plant-based proteins, whole-grain carbohydrates was discussed in detail with the patient.   A list for source of those nutrients were also provided to the patient.  Patient will use only water or unsweetened  tea for hydration. B.  The need to stay away from risky substances including alcohol, smoking; obtaining 7 to 9 hours of restorative sleep, at least 150 minutes of moderate intensity exercise weekly, the importance of healthy social connections,  and stress reduction techniques were discussed. C.  A full color page of  Calorie density of various food groups per pound showing examples of each food groups was provided to the patient.  - Nutritional counseling repeated/built upon at each appointment.  - The patient admits there is a room for improvement in their diet and drink choices. -  Suggestion is made for the patient to avoid simple carbohydrates from their diet including Cakes, Sweet Desserts / Pastries, Ice Cream, Soda (diet and regular), Sweet Tea, Candies, Chips, Cookies, Sweet Pastries, Store Bought Juices, Alcohol in Excess of 1-2 drinks a day, Artificial Sweeteners, Coffee Creamer, and Sugar-free Products. This will help patient to have stable blood glucose profile and potentially avoid unintended weight gain.   - I encouraged the patient to switch to unprocessed or minimally processed complex starch and increased protein intake (animal or plant source),  fruits, and vegetables.   - Patient is advised to stick to a routine mealtimes to eat 3 meals a day and avoid unnecessary snacks (to snack only to correct hypoglycemia).   - she is advised to maintain close follow up with Bevely Doffing, FNP for primary care needs.   -Thank you for involving me in the care of this pleasant patient.  Time spent with the patient: 60  minutes spent in  counseling her about her hypoglycemia and the rest in obtaining information about her symptoms, reviewing her previous labs/studies (including abstractions from other facilities),  evaluations, and treatments,  and developing a plan to confirm diagnosis and long term treatment based on the latest standards of care/guidelines; and documenting her care.  Vicki Thomas participated in the discussions, expressed understanding, and voiced agreement with the above plans.  All questions were answered to her satisfaction. she is encouraged to contact clinic should she have any questions or concerns prior to her return visit.   Follow up plan: Return in about 2 weeks (around 01/09/2024) for hypoglycemia follow up with previsit labs.   Benton Rio, [facility] [address] [place], [place] [address] Phone: [phone] Fax: [phone]   12/26/2023, 11:29 AM

## 2023-12-29 LAB — COMPREHENSIVE METABOLIC PANEL WITH GFR
ALT: 15 IU/L (ref 0–32)
AST: 19 IU/L (ref 0–40)
Albumin: 3.9 g/dL (ref 3.9–4.9)
Alkaline Phosphatase: 95 IU/L (ref 49–135)
BUN/Creatinine Ratio: 21 (ref 12–28)
BUN: 16 mg/dL (ref 8–27)
Bilirubin Total: 0.3 mg/dL (ref 0.0–1.2)
CO2: 23 mmol/L (ref 20–29)
Calcium: 9 mg/dL (ref 8.7–10.3)
Chloride: 102 mmol/L (ref 96–106)
Creatinine, Ser: 0.78 mg/dL (ref 0.57–1.00)
Globulin, Total: 1.8 g/dL (ref 1.5–4.5)
Glucose: 112 mg/dL — ABNORMAL HIGH (ref 70–99)
Potassium: 4.5 mmol/L (ref 3.5–5.2)
Sodium: 141 mmol/L (ref 134–144)
Total Protein: 5.7 g/dL — ABNORMAL LOW (ref 6.0–8.5)
eGFR: 86 mL/min/1.73 (ref >59)

## 2023-12-29 LAB — INSULIN AND C-PEPTIDE, SERUM
C-Peptide: 3.1 ng/mL (ref 1.1–4.4)
INSULIN: 11.8 u[IU]/mL (ref 2.6–24.9)

## 2024-01-09 ENCOUNTER — Ambulatory Visit: Payer: Self-pay | Admitting: *Deleted

## 2024-01-09 ENCOUNTER — Ambulatory Visit: Admitting: Nurse Practitioner

## 2024-01-09 ENCOUNTER — Encounter: Payer: Self-pay | Admitting: Nurse Practitioner

## 2024-01-09 VITALS — BP 124/70 | HR 69 | Ht 68.0 in | Wt 206.0 lb

## 2024-01-09 DIAGNOSIS — E161 Other hypoglycemia: Secondary | ICD-10-CM | POA: Diagnosis not present

## 2024-01-09 DIAGNOSIS — R7303 Prediabetes: Secondary | ICD-10-CM | POA: Diagnosis not present

## 2024-01-09 DIAGNOSIS — E162 Hypoglycemia, unspecified: Secondary | ICD-10-CM

## 2024-01-09 MED ORDER — METFORMIN HCL ER 500 MG PO TB24: 500.0000 mg | ORAL_TABLET | Freq: Every day | ORAL | 1 refills | Status: AC

## 2024-01-09 NOTE — Telephone Encounter (Signed)
 See previous triage . Appt scheduled for tomorrow.     Copied from CRM #[id number]. Topic: Clinical - Red Word Triage >> Jan 09, 2024  1:07 PM Ahlexyia S wrote: [facility] that prompted transfer to Nurse Triage: Pt called in stating that she has an strong odor when she urinates, pressure, frequent urination and pt also stated she isnt able to tell if she is experiencing any burning due to her being mild to moderate constant pain all the time. Warm transferred to nurse triage.

## 2024-01-09 NOTE — Progress Notes (Signed)
 Endocrinology Follow Up Note                                            01/09/2024, 10:40 AM   Subjective:    Patient ID: Vicki Thomas, female    DOB: [birth year], PCP Bevely Doffing, FNP   Past Medical History:  Diagnosis Date   Anxiety    Arthritis    Bipolar 1 disorder (HCC)    Chronic back pain    Chronic fatigue    Chronic headaches    Chronic pain    Fibromyalgia    Hiatal hernia    History of borderline personality disorder    Interstitial cystitis    Lymphedema    Panic disorder with agoraphobia    PTSD (post-traumatic stress disorder)    Past Surgical History:  Procedure Laterality Date   ABDOMINAL HYSTERECTOMY     APPENDECTOMY     BARIATRIC SURGERY     CHOLECYSTECTOMY     COLONOSCOPY N/A 08/09/2023   Procedure: COLONOSCOPY;  Surgeon: Eartha Angelia Sieving, MD;  Location: [facility];  Service: Gastroenterology;  Laterality: N/A;   ESOPHAGOGASTRODUODENOSCOPY N/A 08/09/2023   Procedure: EGD (ESOPHAGOGASTRODUODENOSCOPY);  Surgeon: Eartha Angelia, Sieving, MD;  Location: [facility];  Service: Gastroenterology;  Laterality: N/A;   ESOPHAGUS SURGERY     FOOT SURGERY     HEMORRHOID SURGERY     HERNIA REPAIR     TONSILLECTOMY     Social History   Socioeconomic History   Marital status: Single    Spouse name: Not on file   Number of children: Not on file   Years of education: Not on file   Highest education level: Not on file  Occupational History   Not on file  Tobacco Use   Smoking status: Former    Current packs/day: 1.00    Average packs/day: 1 pack/day for 6.0 years (6.0 ttl pk-yrs)    Types: Cigarettes   Smokeless tobacco: Never  Vaping Use   Vaping status: Never Used  Substance and Sexual Activity   Alcohol use: No   Drug use: Yes    Types: Marijuana   Sexual activity: Not Currently  Other Topics Concern   Not on file  Social History Narrative   Not on file   Social Drivers of Health   Tobacco Use: Medium Risk  (01/09/2024)   Patient History    Smoking Tobacco Use: Former    Smokeless Tobacco Use: Never    Passive Exposure: Not on Actuary Strain: Not on file  Food Insecurity: No Food Insecurity (08/06/2023)   Epic    Worried About Programme Researcher, Broadcasting/film/video in the Last Year: Never true    Ran Out of Food in the Last Year: Never true  Transportation Needs: No Transportation Needs (08/06/2023)   Epic    Lack of Transportation (Medical): No    Lack of Transportation (Non-Medical): No  Physical Activity: Not on file  Stress: Not on file  Social Connections: Unknown (05/30/2021)   Received from [facility]   Social Network    Social Network: Not on file  Depression (PHQ2-9): High Risk (08/02/2023)   Depression (PHQ2-9)    PHQ-2 Score: 18  Alcohol Screen: Low Risk (05/05/2023)   Alcohol Screen    Last Alcohol Screening Score (AUDIT): 0  Housing: Low Risk (  08/06/2023)   Epic    Unable to Pay for Housing in the Last Year: No    Number of Times Moved in the Last Year: 0    Homeless in the Last Year: No  Utilities: Not At Risk (08/06/2023)   Epic    Threatened with loss of utilities: No  Health Literacy: Not on file   Family History  Problem Relation Age of Onset   Alcohol abuse Mother    Colon polyps Mother    Bipolar disorder Father    Paranoid behavior Father    Alcohol abuse Father    Bipolar disorder Sister    Anxiety disorder Sister    Alcohol abuse Brother    Drug abuse Brother    Colon polyps Sibling    Schizophrenia Maternal Grandfather    Anxiety disorder Maternal Aunt    Bipolar disorder Paternal Aunt    Outpatient Encounter Medications as of 01/09/2024  Medication Sig   aluminum -magnesium  hydroxide 200-200 MG/5ML suspension Take 15 mLs by mouth every 8 (eight) hours as needed for indigestion.   benztropine  (COGENTIN ) 0.5 MG tablet Take 0.5 mg by mouth daily.   bisacodyl  (DULCOLAX) 5 MG EC tablet Take 5 mg by mouth daily as needed for moderate constipation.    Blood Glucose Monitoring Suppl DEVI 1 each by Does not apply route in the morning, at noon, and at bedtime. May substitute to any manufacturer covered by patient's insurance.   busPIRone  (BUSPAR ) 7.5 MG tablet Take 1 tablet (7.5 mg total) by mouth 3 (three) times daily.   calcium carbonate (TUMS EX) 750 MG chewable tablet Chew 1 tablet by mouth daily as needed for heartburn.   clotrimazole  (GYNE-LOTRIMIN ) 1 % vaginal cream Place 1 Applicatorful vaginally at bedtime.   Continuous Glucose Sensor (DEXCOM G7 SENSOR) MISC Inject 1 Application into the skin as directed. Change sensor every 10 days as directed.   Glucose Blood (BLOOD GLUCOSE TEST STRIPS) STRP Accu chek guide meter strips. Three times daily testing DX e11.65   hydrocortisone  (ANUSOL -HC) 2.5 % rectal cream Place 1 Application rectally 2 (two) times daily.   lubiprostone  (AMITIZA ) 8 MCG capsule Take 1 capsule (8 mcg total) by mouth 2 (two) times daily with a meal.   meclizine  (ANTIVERT ) 25 MG tablet Take 1 tablet (25 mg total) by mouth 3 (three) times daily as needed for dizziness.   metFORMIN  (GLUCOPHAGE -XR) 500 MG 24 hr tablet Take 1 tablet (500 mg total) by mouth daily with breakfast.   midodrine  (PROAMATINE ) 5 MG tablet Take 1 tablet (5 mg total) by mouth 3 (three) times daily with meals.   ondansetron  (ZOFRAN ) 4 MG tablet Take 4 mg by mouth 2 (two) times daily as needed.   polyethylene glycol (MIRALAX  / GLYCOLAX ) 17 g packet Take 17 g by mouth daily.   potassium chloride  (KLOR-CON ) 10 MEQ tablet Take 1 tablet (10 mEq total) by mouth daily as needed (pot drops, muscle spasms).   RABEprazole  (ACIPHEX ) 20 MG tablet Take 1 tablet (20 mg total) by mouth 2 (two) times daily before a meal.   simethicone  (MYLICON) 125 MG chewable tablet Chew 125 mg by mouth every 6 (six) hours as needed for flatulence.   sucralfate  (CARAFATE ) 1 g tablet Take 1 tablet (1 g total) by mouth 4 (four) times daily -  with meals and at bedtime. Dissolve tablet in 1-2  tablespoons of water, then drink slurry.   traZODone  (DESYREL ) 50 MG tablet TAKE 1-2 TABLETS BY MOUTH AT BEDTIME AS NEEDED FOR  SLEEP   UZEDY  50 MG/0.14ML SUSY Inject 50 mg into the skin every 14 (fourteen) days.   valbenazine (INGREZZA) 60 MG capsule Take 60 mg by mouth daily.   No facility-administered encounter medications on file as of 01/09/2024.   ALLERGIES: Allergies  Allergen Reactions   Bee Venom Swelling    Requires Epipen   Brexpiprazole  Other (See Comments)    Severe head pain   Erythromycin Shortness Of Breath    Throat closes, flushing    Penicillins Shortness Of Breath    Throat closes, flushing   Abilify [Aripiprazole] Other (See Comments)    Unknown    Aripiprazole Other (See Comments)    Unknown   Ciprocin-Fluocin-Procin [Fluocinolone Acetonide] Other (See Comments)    Unknown   Fexofenadine Hcl Other (See Comments)    Unknown    Fluoxetine Hcl Other (See Comments)    Aggression   Gabapentin Other (See Comments)    Dizziness and excessive falling   Haloperidol Decanoate Other (See Comments)    Facial mask symptoms   Iodine Other (See Comments)    Unknown    Lamotrigine Other (See Comments)    Vertigo Increased falls   Lithium  Other (See Comments)    Unknown    Lurasidone Hcl Other (See Comments)    Feels like her head is going to explode Dizziness   Marcaine [Bupivacaine Hcl] Other (See Comments)    Unknown    Mirtazapine Other (See Comments)    Dizziness, Blurred vision   Paroxetine Hcl Other (See Comments)    Unknown    Pregabalin Other (See Comments)    Unknown    Quetiapine Fumarate Swelling   Seroquel [Quetiapine Fumarate] Other (See Comments)    Unknown    Tape Other (See Comments)    Unknown    Zonisamide Other (See Comments)    Unknown     VACCINATION STATUS: Immunization History  Administered Date(s) Administered   Influenza,inj,Quad PF,6+ Mos 01/20/2014   Moderna SARS-COV2 Booster Vaccination 02/19/2020   Moderna Sars-Covid-2  Vaccination 07/03/2019, 07/31/2019    HPI Vicki Thomas is 63 y.o. female who presents today with a medical history as above. she is being seen in follow up after being seen in consultation for hypoglycemia requested by Bevely Doffing, FNP.  she has been dealing with symptoms of glucose dropping (sweatiness, forgetful, jitteriness) intermittently for about 2 years, however she notes it has been worsening.  She reports glucose as low as 33 in the past.  I did review her glucose meter and there are quite a few 50's-60's as well as high sugars of over 268.  She has history of gastric bypass surgery with nissen fundoplication which forces her to eat small meals more frequently throughout the day.  She even reports having to get up and eat in the middle of the night to help prevent sugar from dropping.  She drinks water, pepsi, and hot tea (with honey).  She grazes on foods throughout the day, trying to avoid processed foods.  She is on Uzedy  for her schizophrenia and will take occasional risperidone  orally for breakthrough episodes.  These medications do have tendency to elevate glucose levels.  She started on these about a year ago.  She notes she has tried all the other medications to treat her schizophrenia but was allergic to the other options.  She does have transportation issues.  She relies on her family to bring her to doctors appointments.  She did use healthcare transportation services previously but  due to her agoraphobia, could not continue.  01/09/24- Patient notes she is trying to sub out simple carbs for complex carbs and focus more on protein intake.  She is seeing drastic fluctuations in glucose.  She did check with her mental health provider about her current meds and there is no other option for her to use to control her schizophrenia.  She stopped using the CGM as it was keeping her awake at night.  I did see her data showing TIR 87%, TAR 11%, TBR 2%.  She does have a low glucose after  every high spike, consistent with reactive hypoglycemia.  Her insulin , c-peptide levels were normal.  Review of systems  Constitutional: + increasing body weight,  current Body mass index is 31.32 kg/m. , no fatigue, no subjective hyperthermia, no subjective hypothermia Eyes: no blurry vision, no xerophthalmia ENT: no sore throat, no nodules palpated in throat, no dysphagia/odynophagia, no hoarseness Cardiovascular: no chest pain, no shortness of breath, no palpitations, no leg swelling Respiratory: no cough, no shortness of breath Gastrointestinal: no nausea/vomiting/diarrhea Musculoskeletal: + diffuse muscle/joint aches- uses rollator Skin: no rashes, no hyperemia Neurological: no tremors, no numbness, no tingling, no dizziness Psychiatric: no depression, no anxiety  Objective:    BP Readings from Last 3 Encounters:  01/09/24 124/70  12/26/23 118/70  09/28/23 108/68    BP 124/70   Pulse 69   Ht 5' 8 (1.727 m)   Wt 206 lb (93.4 kg)   SpO2 98%   BMI 31.32 kg/m   Wt Readings from Last 3 Encounters:  01/09/24 206 lb (93.4 kg)  12/26/23 202 lb (91.6 kg)  09/28/23 186 lb (84.4 kg)      Physical Exam- Limited  Constitutional:  Body mass index is 31.32 kg/m. , not in acute distress, normal state of mind Eyes:  EOMI, no exophthalmos Musculoskeletal: no gross deformities, strength intact in all four extremities, no gross restriction of joint movements, uses rollator Skin:  no rashes, no hyperemia Neurological: no tremor with outstretched hands  CMP ( most recent) CMP     Component Value Date/Time   NA 141 12/28/2023 1412   NA 141 07/03/2011 1942   K 4.5 12/28/2023 1412   K 3.8 07/03/2011 1942   CL 102 12/28/2023 1412   CL 110 (H) 07/03/2011 1942   CO2 23 12/28/2023 1412   CO2 25 07/03/2011 1942   GLUCOSE 112 (H) 12/28/2023 1412   GLUCOSE 112 (H) 08/22/2023 1337   GLUCOSE 82 07/03/2011 1942   BUN 16 12/28/2023 1412   BUN 9 07/03/2011 1942   CREATININE 0.78  12/28/2023 1412   CREATININE 0.79 07/03/2011 1942   CALCIUM 9.0 12/28/2023 1412   CALCIUM 8.0 (L) 07/03/2011 1942   PROT 5.7 (L) 12/28/2023 1412   PROT 6.2 (L) 07/03/2011 1942   ALBUMIN 3.9 12/28/2023 1412   ALBUMIN 3.3 (L) 07/03/2011 1942   AST 19 12/28/2023 1412   AST 22 07/03/2011 1942   ALT 15 12/28/2023 1412   ALT 27 07/03/2011 1942   ALKPHOS 95 12/28/2023 1412   ALKPHOS 184 (H) 07/03/2011 1942   BILITOT 0.3 12/28/2023 1412   BILITOT 0.2 07/03/2011 1942   EGFR 86 12/28/2023 1412   GFRNONAA >60 08/22/2023 1337   GFRNONAA >60 07/03/2011 1942     Diabetic Labs (most recent): Lab Results  Component Value Date   HGBA1C 6.2 (A) 12/26/2023   HGBA1C 5.0 05/13/2023     Lipid Panel ( most recent) Lipid Panel  Component Value Date/Time   CHOL 124 05/13/2023 0642   TRIG 64 05/13/2023 0642   HDL 59 05/13/2023 0642   CHOLHDL 2.1 05/13/2023 0642   VLDL 13 05/13/2023 0642   LDLCALC 52 05/13/2023 0642      Lab Results  Component Value Date   TSH 3.190 08/08/2023   TSH 2.704 01/15/2014   TSH 0.887 07/03/2011   TSH 5.95 (H) 04/16/2011        MRCP results from 08/05/23 Narrative & Impression  CLINICAL DATA:  Right upper quadrant abdominal pain, common bile duct dilatation identified by ultrasound, status post cholecystectomy   EXAM: MRI ABDOMEN WITHOUT AND WITH CONTRAST (INCLUDING MRCP)   TECHNIQUE: Multiplanar multisequence MR imaging of the abdomen was performed both before and after the administration of intravenous contrast. Heavily T2-weighted images of the biliary and pancreatic ducts were obtained, and three-dimensional MRCP images were rendered by post processing.   CONTRAST:  7.5mL GADAVIST  GADOBUTROL  1 MMOL/ML IV SOLN   COMPARISON:  Same-day right upper quadrant ultrasound, CT abdomen pelvis, 12/17/2011   FINDINGS: Lower chest: No acute abnormality.   Hepatobiliary: No focal liver abnormality is seen. Status post cholecystectomy. Intra and  extrahepatic biliary ductal dilatation, the common bile duct measuring up to 1.4 cm in caliber. This is unchanged when compared to remote prior CT dated 12/17/2011, in the duct generally tapers to the ampulla without calculus or other obstruction (series 30, image 19).   Pancreas: Mild prominence of the pancreatic duct along its length to the ampulla, measuring up to 0.4 cm (series 32, image 21). No inflammatory findings.   Spleen: Normal in size without significant abnormality.   Adrenals/Urinary Tract: Adrenal glands are unremarkable. Kidneys are normal, without obvious renal calculi, solid lesion, or hydronephrosis.   Stomach/Bowel: Roux gastric bypass. No evidence of bowel wall thickening, distention, or inflammatory changes. Large burden of stool throughout the colon.   Vascular/Lymphatic: No significant vascular findings are present. No enlarged abdominal lymph nodes.   Other: No abdominal wall hernia or abnormality. No ascites.   Musculoskeletal: No acute or significant osseous findings.   IMPRESSION: 1. Intra and extrahepatic biliary ductal dilatation, the common bile duct measuring up to 1.4 cm in caliber. This is unchanged when compared to remote prior CT dated 12/17/2011, and the duct generally tapers to the ampulla without calculus or other obstruction. 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 2. Mild prominence of the pancreatic duct along its length to the ampulla, measuring up to 0.4 cm. This is of uncertain significance, without acute inflammatory findings and as above is similar to prior examination dated 2013. 3. Roux gastric bypass. 4. Large burden of stool throughout the colon.     Electronically Signed   By: Marolyn JONETTA Jaksch M.D.   On: 08/05/2023 21:34      Assessment & Plan:   1. Hypoglycemia (Primary) 2. Prediabetes 3. Reactive hypoglycemia  - Vicki Thomas  is being seen at a kind request of Bevely Doffing,  FNP. - I have reviewed her available records and clinically evaluated the patient.  - Based on these reviews, she has reactive hypoglycemia (possibly exacerbated from her Uzedy  and Risperidone  use),  however, there is not sufficient information to proceed with definitive treatment plan.  She notes glucose as low as 32.  She does have history of gastric bypass with Nissen fundoplication.  She did have A1c checked which was 6.2%, increasing from her April value of 5%.  She admits she has been eating more candy  recently to help bring up her significant low sugar readings.  Not concerned about insulinoma at this time as recent imaging shows no pancreatic abnormality.  -I did start her on Metformin  500 mg ER daily after breakfast to help control spikes in glucose and subsequent hypoglycemia shortly after.  We also talked about continuing to adjust diet to get control over these spikes as well.  We did go over proper diet today. The following Lifestyle Medicine recommendations according to American College of Lifestyle Medicine [facility]) were discussed and offered to patient and she agrees to start the journey:  A. Whole Foods, Plant-based plate comprising of fruits and vegetables, plant-based proteins, whole-grain carbohydrates was discussed in detail with the patient.   A list for source of those nutrients were also provided to the patient.  Patient will use only water or unsweetened tea for hydration. B.  The need to stay away from risky substances including alcohol, smoking; obtaining 7 to 9 hours of restorative sleep, at least 150 minutes of moderate intensity exercise weekly, the importance of healthy social connections,  and stress reduction techniques were discussed. C.  A full color page of  Calorie density of various food groups per pound showing examples of each food groups was provided to the patient.  - Nutritional counseling repeated/built upon at each appointment.  - The patient admits there  is a room for improvement in their diet and drink choices. -  Suggestion is made for the patient to avoid simple carbohydrates from their diet including Cakes, Sweet Desserts / Pastries, Ice Cream, Soda (diet and regular), Sweet Tea, Candies, Chips, Cookies, Sweet Pastries, Store Bought Juices, Alcohol in Excess of 1-2 drinks a day, Artificial Sweeteners, Coffee Creamer, and Sugar-free Products. This will help patient to have stable blood glucose profile and potentially avoid unintended weight gain.   - I encouraged the patient to switch to unprocessed or minimally processed complex starch and increased protein intake (animal or plant source), fruits, and vegetables.   - Patient is advised to stick to a routine mealtimes to eat 3 meals a day and avoid unnecessary snacks (to snack only to correct hypoglycemia).   - she is advised to maintain close follow up with Bevely Doffing, FNP for primary care needs.    I spent  27  minutes in the care of the patient today including review of labs from Thyroid Function, CMP, and other relevant labs ; imaging/biopsy records (current and previous including abstractions from other facilities); face-to-face time discussing  her lab results and symptoms, medications doses, her options of short and long term treatment based on the latest standards of care / guidelines;   and documenting the encounter.  Vicki Thomas  participated in the discussions, expressed understanding, and voiced agreement with the above plans.  All questions were answered to her satisfaction. she is encouraged to contact clinic should she have any questions or concerns prior to her return visit.   Follow up plan: Return in about 3 months (around 04/08/2024) for reactive hypoglycemia follow up with A1c in office.  Benton Rio, [facility] [address] [place], [place] [address] Phone: [phone] Fax: [phone]   01/09/2024, 10:40 AM

## 2024-01-09 NOTE — Telephone Encounter (Signed)
Noted patient scheduled

## 2024-01-09 NOTE — Telephone Encounter (Signed)
 FYI Only or Action Required?: FYI only for provider: appointment scheduled on 01/10/24.  Patient was last seen in primary care on 08/19/2023 by Vicki Thomas BRAVO, MD.  Called Nurse Triage reporting Dysuria.  Symptoms began several days ago. 4 days ago  Interventions attempted: Rest, hydration, or home remedies.  Symptoms are: gradually worsening.  Triage Disposition: See Physician Within 24 Hours  Patient/caregiver understands and will follow disposition?: Yes         Reason for Disposition  Bad or foul-smelling urine  Answer Assessment - Initial Assessment Questions Appt scheduled tomorrow with PCP. Recommended if sx worsen call back or go to ED.      1. SYMPTOM: What's the main symptom you're concerned about? (e.g., frequency, incontinence)     Frequency, pressure strong odor 2. ONSET: When did the  sx  start?     4 days ago  3. PAIN: Is there any pain? If Yes, ask: How bad is it? (Scale: 1-10; mild, moderate, severe)     Pressure low abdomen 4. CAUSE: What do you think is causing the symptoms?     UTI 5. OTHER SYMPTOMS: Do you have any other symptoms? (e.g., blood in urine, fever, flank pain, pain with urination)     Moderate pressure pain low abdomen, frequency, strong odor, trickle of urine at times. No back pain no fever no blood reported in urine. 6. PREGNANCY: Is there any chance you are pregnant? When was your last menstrual period?     na  Protocols used: Urinary Symptoms-A-AH

## 2024-01-10 ENCOUNTER — Ambulatory Visit: Payer: Self-pay

## 2024-02-16 ENCOUNTER — Telehealth: Payer: Self-pay

## 2024-02-16 NOTE — Telephone Encounter (Signed)
 Faxed, copied and sent to scan

## 2024-02-16 NOTE — Telephone Encounter (Signed)
 [facility] Forms Noted Copied Scanned Original in provider box Copy at front desk

## 2024-02-17 NOTE — Telephone Encounter (Signed)
 Received fax back saying incomplete, needs description of change in medical status in Section D   Copied Noted Sleeved Form placed in provider box Francie) Copy placed from desk folder

## 2024-02-26 ENCOUNTER — Other Ambulatory Visit: Payer: Self-pay | Admitting: Gastroenterology

## 2024-02-26 DIAGNOSIS — K5904 Chronic idiopathic constipation: Secondary | ICD-10-CM

## 2024-03-02 ENCOUNTER — Telehealth: Payer: Self-pay

## 2024-03-02 NOTE — Telephone Encounter (Signed)
 Copied from CRM [id number]. Topic: General - Other >> Mar 02, 2024 11:26 AM Vicki Thomas wrote: Reason for CRM: Pt called regarding her home health orders with [facility], she is requesting more hours. Says she initially requested this from the office two weeks ago.   Fax: [phone]

## 2024-04-12 ENCOUNTER — Ambulatory Visit: Admitting: Nurse Practitioner
